# Patient Record
Sex: Female | Born: 1940 | Race: Black or African American | Hispanic: No | Smoking: Never smoker
Health system: Southern US, Community
[De-identification: ages and names within clinical notes are randomized; demographics above are authoritative.]

## PROBLEM LIST (undated history)

## (undated) DIAGNOSIS — C159 Malignant neoplasm of esophagus, unspecified: Secondary | ICD-10-CM

## (undated) DIAGNOSIS — M1710 Unilateral primary osteoarthritis, unspecified knee: Secondary | ICD-10-CM

## (undated) DIAGNOSIS — I10 Essential (primary) hypertension: Secondary | ICD-10-CM

## (undated) DIAGNOSIS — K829 Disease of gallbladder, unspecified: Secondary | ICD-10-CM

## (undated) DIAGNOSIS — D3A010 Benign carcinoid tumor of the duodenum: Secondary | ICD-10-CM

## (undated) DIAGNOSIS — F3289 Other specified depressive episodes: Secondary | ICD-10-CM

## (undated) DIAGNOSIS — F329 Major depressive disorder, single episode, unspecified: Secondary | ICD-10-CM

## (undated) DIAGNOSIS — R51 Headache: Secondary | ICD-10-CM

## (undated) DIAGNOSIS — O009 Unspecified ectopic pregnancy without intrauterine pregnancy: Secondary | ICD-10-CM

## (undated) DIAGNOSIS — M179 Osteoarthritis of knee, unspecified: Secondary | ICD-10-CM

## (undated) DIAGNOSIS — H5440 Blindness, one eye, unspecified eye: Secondary | ICD-10-CM

## (undated) DIAGNOSIS — R55 Syncope and collapse: Secondary | ICD-10-CM

## (undated) DIAGNOSIS — C50919 Malignant neoplasm of unspecified site of unspecified female breast: Secondary | ICD-10-CM

## (undated) DIAGNOSIS — E785 Hyperlipidemia, unspecified: Secondary | ICD-10-CM

## (undated) DIAGNOSIS — C189 Malignant neoplasm of colon, unspecified: Secondary | ICD-10-CM

## (undated) DIAGNOSIS — N200 Calculus of kidney: Secondary | ICD-10-CM

## (undated) DIAGNOSIS — C801 Malignant (primary) neoplasm, unspecified: Secondary | ICD-10-CM

## (undated) DIAGNOSIS — F411 Generalized anxiety disorder: Secondary | ICD-10-CM

## (undated) DIAGNOSIS — K219 Gastro-esophageal reflux disease without esophagitis: Secondary | ICD-10-CM

## (undated) DIAGNOSIS — R197 Diarrhea, unspecified: Secondary | ICD-10-CM

## (undated) DIAGNOSIS — M129 Arthropathy, unspecified: Secondary | ICD-10-CM

## (undated) DIAGNOSIS — Z923 Personal history of irradiation: Secondary | ICD-10-CM

## (undated) HISTORY — DX: Other specified depressive episodes: F32.89

## (undated) HISTORY — DX: Hyperlipidemia, unspecified: E78.5

## (undated) HISTORY — DX: Headache: R51

## (undated) HISTORY — DX: Malignant neoplasm of esophagus, unspecified: C15.9

## (undated) HISTORY — DX: Generalized anxiety disorder: F41.1

## (undated) HISTORY — DX: Malignant (primary) neoplasm, unspecified: C80.1

## (undated) HISTORY — DX: Essential (primary) hypertension: I10

## (undated) HISTORY — DX: Gastro-esophageal reflux disease without esophagitis: K21.9

## (undated) HISTORY — PX: BREAST BIOPSY: SHX20

## (undated) HISTORY — DX: Disease of gallbladder, unspecified: K82.9

## (undated) HISTORY — DX: Blindness, one eye, unspecified eye: H54.40

## (undated) HISTORY — PX: BREAST SURGERY: SHX581

## (undated) HISTORY — DX: Malignant neoplasm of colon, unspecified: C18.9

## (undated) HISTORY — DX: Syncope and collapse: R55

## (undated) HISTORY — DX: Calculus of kidney: N20.0

## (undated) HISTORY — DX: Diarrhea, unspecified: R19.7

## (undated) HISTORY — PX: PARTIAL HYSTERECTOMY: SHX80

## (undated) HISTORY — PX: TOTAL KNEE ARTHROPLASTY: SHX125

## (undated) HISTORY — PX: COLONOSCOPY: SHX174

## (undated) HISTORY — DX: Unilateral primary osteoarthritis, unspecified knee: M17.10

## (undated) HISTORY — DX: Osteoarthritis of knee, unspecified: M17.9

## (undated) HISTORY — DX: Major depressive disorder, single episode, unspecified: F32.9

## (undated) HISTORY — DX: Benign carcinoid tumor of the duodenum: D3A.010

## (undated) HISTORY — PX: COLON RESECTION: SHX5231

## (undated) HISTORY — DX: Unspecified ectopic pregnancy without intrauterine pregnancy: O00.90

## (undated) HISTORY — DX: Arthropathy, unspecified: M12.9

---

## 2004-03-05 ENCOUNTER — Ambulatory Visit: Payer: Self-pay | Admitting: Oncology

## 2004-05-16 ENCOUNTER — Ambulatory Visit: Payer: Self-pay | Admitting: Unknown Physician Specialty

## 2006-01-17 ENCOUNTER — Ambulatory Visit: Payer: Self-pay

## 2006-01-18 ENCOUNTER — Ambulatory Visit: Payer: Self-pay

## 2006-03-22 ENCOUNTER — Ambulatory Visit: Payer: Self-pay | Admitting: Unknown Physician Specialty

## 2006-05-09 ENCOUNTER — Other Ambulatory Visit: Payer: Self-pay

## 2006-05-09 ENCOUNTER — Ambulatory Visit: Payer: Self-pay | Admitting: Surgery

## 2006-05-18 ENCOUNTER — Ambulatory Visit: Payer: Self-pay | Admitting: Surgery

## 2006-06-15 ENCOUNTER — Ambulatory Visit: Payer: Self-pay | Admitting: Surgery

## 2006-06-22 ENCOUNTER — Ambulatory Visit: Payer: Self-pay | Admitting: Surgery

## 2006-07-13 ENCOUNTER — Ambulatory Visit: Payer: Self-pay | Admitting: Oncology

## 2006-08-04 ENCOUNTER — Ambulatory Visit: Payer: Self-pay | Admitting: Oncology

## 2006-08-04 ENCOUNTER — Ambulatory Visit: Payer: Self-pay | Admitting: Radiation Oncology

## 2006-09-04 ENCOUNTER — Ambulatory Visit: Payer: Self-pay | Admitting: Radiation Oncology

## 2006-09-04 ENCOUNTER — Ambulatory Visit: Payer: Self-pay | Admitting: Oncology

## 2006-10-04 ENCOUNTER — Ambulatory Visit: Payer: Self-pay | Admitting: Radiation Oncology

## 2006-10-04 ENCOUNTER — Ambulatory Visit: Payer: Self-pay | Admitting: Oncology

## 2006-11-13 ENCOUNTER — Ambulatory Visit: Payer: Self-pay | Admitting: Radiation Oncology

## 2006-11-28 ENCOUNTER — Ambulatory Visit: Payer: Self-pay | Admitting: Family Medicine

## 2006-12-04 ENCOUNTER — Ambulatory Visit: Payer: Self-pay | Admitting: Radiation Oncology

## 2007-01-18 ENCOUNTER — Inpatient Hospital Stay: Payer: Self-pay | Admitting: Surgery

## 2007-02-04 ENCOUNTER — Ambulatory Visit: Payer: Self-pay | Admitting: Oncology

## 2007-02-14 ENCOUNTER — Ambulatory Visit: Payer: Self-pay | Admitting: Oncology

## 2007-03-06 ENCOUNTER — Ambulatory Visit: Payer: Self-pay | Admitting: Oncology

## 2007-04-03 ENCOUNTER — Ambulatory Visit: Payer: Self-pay | Admitting: Unknown Physician Specialty

## 2007-04-24 ENCOUNTER — Encounter: Payer: Self-pay | Admitting: Gastroenterology

## 2007-04-24 ENCOUNTER — Ambulatory Visit: Payer: Self-pay | Admitting: Unknown Physician Specialty

## 2007-04-26 ENCOUNTER — Ambulatory Visit: Payer: Self-pay | Admitting: Unknown Physician Specialty

## 2007-04-29 ENCOUNTER — Ambulatory Visit: Payer: Self-pay | Admitting: Surgery

## 2007-06-06 DIAGNOSIS — C50919 Malignant neoplasm of unspecified site of unspecified female breast: Secondary | ICD-10-CM

## 2007-06-06 HISTORY — PX: BREAST EXCISIONAL BIOPSY: SUR124

## 2007-06-06 HISTORY — DX: Malignant neoplasm of unspecified site of unspecified female breast: C50.919

## 2007-06-20 ENCOUNTER — Encounter: Payer: Self-pay | Admitting: Gastroenterology

## 2007-06-20 ENCOUNTER — Inpatient Hospital Stay (HOSPITAL_COMMUNITY): Admission: AD | Admit: 2007-06-20 | Discharge: 2007-06-27 | Payer: Self-pay | Admitting: Gastroenterology

## 2007-07-24 ENCOUNTER — Ambulatory Visit: Payer: Self-pay | Admitting: Gastroenterology

## 2007-07-24 LAB — CONVERTED CEMR LAB
ALT: 18 units/L (ref 0–35)
Bilirubin, Direct: 0.1 mg/dL (ref 0.0–0.3)
Calcium: 9.8 mg/dL (ref 8.4–10.5)
Eosinophils Absolute: 0 10*3/uL (ref 0.0–0.6)
Eosinophils Relative: 0.7 % (ref 0.0–5.0)
GFR calc Af Amer: 92 mL/min
GFR calc non Af Amer: 76 mL/min
Glucose, Bld: 111 mg/dL — ABNORMAL HIGH (ref 70–99)
Hemoglobin: 12.5 g/dL (ref 12.0–15.0)
Lymphocytes Relative: 30.1 % (ref 12.0–46.0)
MCV: 92 fL (ref 78.0–100.0)
Monocytes Absolute: 0.7 10*3/uL (ref 0.2–0.7)
Neutro Abs: 3.3 10*3/uL (ref 1.4–7.7)
Platelets: 193 10*3/uL (ref 150–400)
Potassium: 2.9 meq/L — ABNORMAL LOW (ref 3.5–5.1)
Sodium: 142 meq/L (ref 135–145)
TSH: 0.6 microintl units/mL (ref 0.35–5.50)
Total Protein: 7.2 g/dL (ref 6.0–8.3)
WBC: 5.7 10*3/uL (ref 4.5–10.5)

## 2007-07-31 ENCOUNTER — Ambulatory Visit: Payer: Self-pay | Admitting: Internal Medicine

## 2007-08-01 ENCOUNTER — Encounter: Payer: Self-pay | Admitting: Gastroenterology

## 2007-08-01 LAB — CONVERTED CEMR LAB: 5-HIAA, 24 Hr Urine: 11.5 mg/(24.h) — ABNORMAL HIGH (ref <=6.0)

## 2007-08-14 ENCOUNTER — Ambulatory Visit: Payer: Self-pay | Admitting: Oncology

## 2007-09-17 ENCOUNTER — Ambulatory Visit: Payer: Self-pay | Admitting: Gastroenterology

## 2007-09-17 LAB — CONVERTED CEMR LAB
Calcium: 9.9 mg/dL (ref 8.4–10.5)
GFR calc Af Amer: 108 mL/min
GFR calc non Af Amer: 89 mL/min
Potassium: 3.2 meq/L — ABNORMAL LOW (ref 3.5–5.1)
Sodium: 142 meq/L (ref 135–145)

## 2007-09-18 ENCOUNTER — Ambulatory Visit: Payer: Self-pay | Admitting: Oncology

## 2007-09-25 ENCOUNTER — Ambulatory Visit (HOSPITAL_COMMUNITY): Admission: RE | Admit: 2007-09-25 | Discharge: 2007-09-25 | Payer: Self-pay | Admitting: Gastroenterology

## 2007-10-01 ENCOUNTER — Telehealth: Payer: Self-pay | Admitting: Gastroenterology

## 2007-10-04 ENCOUNTER — Ambulatory Visit: Payer: Self-pay | Admitting: Oncology

## 2007-10-11 ENCOUNTER — Telehealth: Payer: Self-pay | Admitting: Gastroenterology

## 2007-11-11 DIAGNOSIS — D3A010 Benign carcinoid tumor of the duodenum: Secondary | ICD-10-CM

## 2007-11-11 DIAGNOSIS — R197 Diarrhea, unspecified: Secondary | ICD-10-CM

## 2007-11-11 DIAGNOSIS — O009 Unspecified ectopic pregnancy without intrauterine pregnancy: Secondary | ICD-10-CM

## 2007-11-11 DIAGNOSIS — M129 Arthropathy, unspecified: Secondary | ICD-10-CM | POA: Insufficient documentation

## 2007-11-11 DIAGNOSIS — R51 Headache: Secondary | ICD-10-CM

## 2007-11-11 DIAGNOSIS — I10 Essential (primary) hypertension: Secondary | ICD-10-CM

## 2007-11-11 DIAGNOSIS — R519 Headache, unspecified: Secondary | ICD-10-CM | POA: Insufficient documentation

## 2007-11-11 DIAGNOSIS — F419 Anxiety disorder, unspecified: Secondary | ICD-10-CM

## 2007-11-11 DIAGNOSIS — F329 Major depressive disorder, single episode, unspecified: Secondary | ICD-10-CM

## 2007-11-11 DIAGNOSIS — F32A Depression, unspecified: Secondary | ICD-10-CM | POA: Insufficient documentation

## 2007-11-12 ENCOUNTER — Ambulatory Visit: Payer: Self-pay | Admitting: Gastroenterology

## 2007-11-12 DIAGNOSIS — R933 Abnormal findings on diagnostic imaging of other parts of digestive tract: Secondary | ICD-10-CM

## 2007-11-13 LAB — CONVERTED CEMR LAB
ALT: 20 units/L (ref 0–35)
Basophils Absolute: 0 10*3/uL (ref 0.0–0.1)
Basophils Relative: 0.5 % (ref 0.0–1.0)
CO2: 28 meq/L (ref 19–32)
Calcium: 10.3 mg/dL (ref 8.4–10.5)
Chloride: 104 meq/L (ref 96–112)
Creatinine, Ser: 0.8 mg/dL (ref 0.4–1.2)
GFR calc Af Amer: 92 mL/min
GFR calc non Af Amer: 76 mL/min
Glucose, Bld: 107 mg/dL — ABNORMAL HIGH (ref 70–99)
Hemoglobin: 13.4 g/dL (ref 12.0–15.0)
Lymphocytes Relative: 40.3 % (ref 12.0–46.0)
Monocytes Relative: 11.2 % (ref 3.0–12.0)
Neutro Abs: 2.3 10*3/uL (ref 1.4–7.7)
Neutrophils Relative %: 46.5 % (ref 43.0–77.0)
RBC: 4.31 M/uL (ref 3.87–5.11)
Sodium: 142 meq/L (ref 135–145)
Total Bilirubin: 0.9 mg/dL (ref 0.3–1.2)
Total Protein: 8 g/dL (ref 6.0–8.3)

## 2007-11-25 ENCOUNTER — Ambulatory Visit: Payer: Self-pay | Admitting: Gastroenterology

## 2007-11-26 ENCOUNTER — Telehealth (INDEPENDENT_AMBULATORY_CARE_PROVIDER_SITE_OTHER): Payer: Self-pay | Admitting: *Deleted

## 2007-11-26 LAB — CONVERTED CEMR LAB
CO2: 30 meq/L (ref 19–32)
Calcium: 9.7 mg/dL (ref 8.4–10.5)
Creatinine, Ser: 1 mg/dL (ref 0.4–1.2)
GFR calc Af Amer: 71 mL/min
Glucose, Bld: 108 mg/dL — ABNORMAL HIGH (ref 70–99)
Sodium: 142 meq/L (ref 135–145)

## 2007-11-28 ENCOUNTER — Encounter: Payer: Self-pay | Admitting: Gastroenterology

## 2007-11-28 ENCOUNTER — Ambulatory Visit (HOSPITAL_COMMUNITY): Admission: RE | Admit: 2007-11-28 | Discharge: 2007-11-28 | Payer: Self-pay | Admitting: Gastroenterology

## 2007-12-02 ENCOUNTER — Ambulatory Visit: Payer: Self-pay | Admitting: Gastroenterology

## 2007-12-10 ENCOUNTER — Ambulatory Visit: Payer: Self-pay | Admitting: Gastroenterology

## 2007-12-11 LAB — CONVERTED CEMR LAB
BUN: 13 mg/dL (ref 6–23)
Calcium: 9.8 mg/dL (ref 8.4–10.5)
Creatinine, Ser: 0.9 mg/dL (ref 0.4–1.2)
GFR calc Af Amer: 81 mL/min
GFR calc non Af Amer: 67 mL/min
Glucose, Bld: 105 mg/dL — ABNORMAL HIGH (ref 70–99)

## 2007-12-16 ENCOUNTER — Encounter: Payer: Self-pay | Admitting: Gastroenterology

## 2007-12-25 ENCOUNTER — Ambulatory Visit: Payer: Self-pay | Admitting: Gastroenterology

## 2008-05-04 ENCOUNTER — Ambulatory Visit: Payer: Self-pay | Admitting: Family Medicine

## 2008-07-22 ENCOUNTER — Ambulatory Visit: Payer: Self-pay | Admitting: Gastroenterology

## 2008-07-24 ENCOUNTER — Encounter: Payer: Self-pay | Admitting: Gastroenterology

## 2008-07-28 ENCOUNTER — Ambulatory Visit: Payer: Self-pay | Admitting: Gastroenterology

## 2008-07-28 ENCOUNTER — Encounter: Payer: Self-pay | Admitting: Gastroenterology

## 2008-07-31 ENCOUNTER — Encounter: Payer: Self-pay | Admitting: Gastroenterology

## 2008-08-03 ENCOUNTER — Telehealth: Payer: Self-pay | Admitting: Gastroenterology

## 2008-10-07 ENCOUNTER — Encounter: Payer: Self-pay | Admitting: Family Medicine

## 2008-11-03 ENCOUNTER — Encounter: Payer: Self-pay | Admitting: Family Medicine

## 2008-11-30 ENCOUNTER — Emergency Department: Payer: Self-pay | Admitting: Internal Medicine

## 2008-12-03 ENCOUNTER — Encounter: Payer: Self-pay | Admitting: Family Medicine

## 2008-12-24 ENCOUNTER — Encounter: Payer: Self-pay | Admitting: Gastroenterology

## 2008-12-24 ENCOUNTER — Telehealth (INDEPENDENT_AMBULATORY_CARE_PROVIDER_SITE_OTHER): Payer: Self-pay | Admitting: *Deleted

## 2008-12-28 ENCOUNTER — Ambulatory Visit: Payer: Self-pay | Admitting: Gastroenterology

## 2008-12-29 ENCOUNTER — Ambulatory Visit: Payer: Self-pay | Admitting: Internal Medicine

## 2009-05-01 ENCOUNTER — Emergency Department: Payer: Self-pay | Admitting: Unknown Physician Specialty

## 2009-05-06 ENCOUNTER — Ambulatory Visit: Payer: Self-pay | Admitting: Family Medicine

## 2009-05-19 ENCOUNTER — Ambulatory Visit: Payer: Self-pay | Admitting: Family Medicine

## 2009-09-21 ENCOUNTER — Observation Stay: Payer: Self-pay | Admitting: Internal Medicine

## 2009-10-01 ENCOUNTER — Telehealth (INDEPENDENT_AMBULATORY_CARE_PROVIDER_SITE_OTHER): Payer: Self-pay | Admitting: *Deleted

## 2009-10-08 ENCOUNTER — Encounter: Payer: Self-pay | Admitting: Cardiovascular Disease

## 2009-10-11 ENCOUNTER — Telehealth (INDEPENDENT_AMBULATORY_CARE_PROVIDER_SITE_OTHER): Payer: Self-pay | Admitting: *Deleted

## 2009-10-19 ENCOUNTER — Telehealth (INDEPENDENT_AMBULATORY_CARE_PROVIDER_SITE_OTHER): Payer: Self-pay | Admitting: *Deleted

## 2009-10-20 ENCOUNTER — Ambulatory Visit: Payer: Self-pay

## 2009-10-20 ENCOUNTER — Encounter (HOSPITAL_COMMUNITY): Admission: RE | Admit: 2009-10-20 | Discharge: 2010-01-04 | Payer: Self-pay | Admitting: Internal Medicine

## 2009-10-20 ENCOUNTER — Ambulatory Visit: Payer: Self-pay | Admitting: Cardiology

## 2009-10-20 ENCOUNTER — Ambulatory Visit: Payer: Self-pay | Admitting: Cardiovascular Disease

## 2009-10-20 DIAGNOSIS — R079 Chest pain, unspecified: Secondary | ICD-10-CM

## 2009-11-03 ENCOUNTER — Ambulatory Visit: Payer: Self-pay | Admitting: Oncology

## 2009-11-29 ENCOUNTER — Ambulatory Visit: Payer: Self-pay | Admitting: Oncology

## 2010-02-22 ENCOUNTER — Encounter: Payer: Self-pay | Admitting: Cardiovascular Disease

## 2010-02-28 ENCOUNTER — Telehealth: Payer: Self-pay | Admitting: Internal Medicine

## 2010-03-01 ENCOUNTER — Ambulatory Visit: Payer: Self-pay | Admitting: Cardiology

## 2010-03-28 ENCOUNTER — Ambulatory Visit: Payer: Self-pay | Admitting: General Practice

## 2010-03-29 ENCOUNTER — Ambulatory Visit: Payer: Self-pay | Admitting: Cardiovascular Disease

## 2010-03-29 DIAGNOSIS — E785 Hyperlipidemia, unspecified: Secondary | ICD-10-CM

## 2010-04-08 ENCOUNTER — Ambulatory Visit: Payer: Self-pay | Admitting: Anesthesiology

## 2010-04-11 ENCOUNTER — Inpatient Hospital Stay: Payer: Self-pay | Admitting: General Practice

## 2010-04-13 ENCOUNTER — Encounter: Payer: Self-pay | Admitting: Internal Medicine

## 2010-04-15 ENCOUNTER — Ambulatory Visit: Payer: Self-pay | Admitting: Internal Medicine

## 2010-05-17 ENCOUNTER — Emergency Department: Payer: Self-pay | Admitting: Emergency Medicine

## 2010-05-20 ENCOUNTER — Ambulatory Visit: Payer: Self-pay | Admitting: Oncology

## 2010-06-01 ENCOUNTER — Telehealth: Payer: Self-pay | Admitting: Cardiovascular Disease

## 2010-06-13 ENCOUNTER — Ambulatory Visit: Payer: Self-pay | Admitting: Oncology

## 2010-06-26 ENCOUNTER — Encounter: Payer: Self-pay | Admitting: Gastroenterology

## 2010-06-29 ENCOUNTER — Encounter: Payer: Self-pay | Admitting: Cardiovascular Disease

## 2010-06-29 ENCOUNTER — Emergency Department: Payer: Self-pay | Admitting: Emergency Medicine

## 2010-07-05 NOTE — Assessment & Plan Note (Signed)
Summary: F3W/AMD   Visit Type:  Follow-up Referring Provider:  [facility] Primary Provider:  Tyrone Sage, MD  CC:  3 week follow up. Pre op for right knee replacement; scheduled for Nov. 7 and 2011 with Dr. Ernest Pine.[name]  History of Present Illness: 70 -year-old woman who presented to [facility] in 5/11 for symptoms of chest pain and dizziness/vertigo and was discharged after her cardiac workup was negative with the diagnosis of possible labyrinthitis. Also had a recent episode of neck and chest pain while at church after looking towards to neighbor.  She had a Lexiscan myoview in 5/11.  This showed breast attenuation artifact in the anterior region though they could not definitively exclude mild ischemia.  she feels well and has no cardiac/chest complaints. She does have significant knee pain which limits her ability to exert herself.  She has some exertional dyspnea with moderate to heavy exertion but has never had exertional chest pain.   She is scheduled for right TKR on 04/11/10.   ECG: NSR with a rate of 70 bpm, no St or T wave changes    Current Medications (verified): 1)  Aspirin 325 Mg Tabs (Aspirin) .... One Tablet Once Daily 2)  Zoloft 100 Mg  Tabs (Sertraline Hcl) .... One By Mouth Once Daily 3)  Omeprazole 20 Mg  Tbec (Omeprazole) .... One By Mouth Two Times A Day 4)  Clonazepam 1 Mg  Tabs (Clonazepam) .... One By Mouth Two Times A Day 5)  Simvastatin 80 Mg Tabs (Simvastatin) .... One Tablet At Bedtime 6)  Triamterene-Hctz 37.5-25 Mg  Caps (Triamterene-Hctz) .... One By Mouth Once Daily 7)  Amlodipine Besylate 10 Mg  Tabs (Amlodipine Besylate) .... One By Mouth Once Daily 8)  Arimidex 1 Mg  Tabs (Anastrozole) .... Take 1 Tablet By Mouth Once A Day 9)  K-Dur 20 Meq .... Take 1 Tablet Two Times A Day 10)  Cvs Anti-Diarrhea 2 Mg Tabs (Loperamide Hcl) .... 2 Tablets By Mouth Three Times A Day As Needed 11)  Imodium A-D 2 Mg Tabs (Loperamide Hcl) .... 2 Tablets By  Mouth Once Daily As Needed 12)  Cholestyramine   Powd (Cholestyramine) .... Take 4gram Scoop, Once Daily 13)  Dexilant 60 Mg Cpdr (Dexlansoprazole) .[name].. 1 As Needed 14)  Sertraline Hcl 100 Mg Tabs (Sertraline Hcl) .... Take Two Tablets Once Daily  Allergies (verified): 1)  ! 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  Alcohol Use - no Regular Exercise - no Drug Use - no  Risk Factors: Alcohol Use: 0 (2009/11/03) Caffeine Use: 1-2 (11/03/2009) Exercise: no (11/03/09)  Risk Factors: Smoking Status: never (11/03/2009)  Review of Systems  The patient denies fever, weight loss, weight gain, vision loss, decreased hearing, hoarseness, chest pain, syncope, dyspnea on exertion, peripheral edema, prolonged cough, abdominal pain, incontinence, muscle weakness, depression, and enlarged lymph nodes.         knee pain  Vital Signs:  Patient profile:   70 year old female Height:      65 inches Weight:  177 pounds Pulse rate:   82 / minute BP sitting:   119 / 77  (left arm) Cuff size:   large  Vitals Entered By: Bishop Dublin, CMA (March 29, 2010 3:40 PM)  Physical Exam  General:  Well developed, well nourished, in no acute distress. Head:  normocephalic and atraumatic Neck:  Neck supple, no JVD. No masses, thyromegaly or abnormal cervical nodes. Lungs:  Clear  bilaterally to auscultation and percussion. Heart:  Non-displaced PMI, chest non-tender; regular rate and rhythm, S1, S2 without murmurs, rubs or gallops. Carotid upstroke normal, no bruit. Pedals normal pulses. No edema, no varicosities. Abdomen:  Bowel sounds positive; abdomen soft and non-tender without masses Msk:  Back normal, normal gait. Muscle strength and tone normal. Pulses:  pulses normal in all 4 extremities Extremities:  No clubbing or cyanosis. Neurologic:  Alert and oriented x 3. Skin:  Intact without lesions or rashes. Psych:  Normal affect.   Impression & Recommendations:  Problem # 1:  CHEST PAIN-UNSPECIFIED (ICD-786.50) No chest pain symptoms. Negative stress test 10/2009 no further workup needed Acceptable risk for upcoming knee replacement surgery with Dr. Ernest Pine. Can stop asa if needed prior to surgery.   Her updated medication list for this problem includes:    Aspirin 81 Mg Tbec (Aspirin) .[name]... Take 2 tablet by mouth daily    Amlodipine Besylate 10 Mg Tabs (Amlodipine besylate) ..... One by mouth once daily  Problem # 2:  HYPERTENSION (ICD-401.9) BP is well controlled on todays visit. No medication changes  Her updated medication list for this problem includes:    Aspirin 81 Mg Tbec (Aspirin) .[name]... Take 2 tablet by mouth daily    Triamterene-hctz 37.5-25 Mg Caps (Triamterene-hctz) ..... One by mouth once daily    Amlodipine Besylate 10 Mg Tabs (Amlodipine besylate) ..... One by mouth once daily  Problem # 3:  HYPERLIPIDEMIA-MIXED (ICD-272.4) Decrease simva to 40 mg daily given new warnings concerning 80 mg. No known CAD. Will check cholesterol in late 06/2010 Her updated medication list for this problem includes:     Simvastatin 40 Mg Tabs (Simvastatin) .[name]... Take one tablet by mouth daily at bedtime  Patient Instructions: 1)  Your physician recommends that you return for a FASTING lipid profile: End of January 2)  Your physician has recommended you  make the following change in your medication: Decrease simvastatin 40mg  daily 3)  Your physician wants you to follow-up in:   1 year You will receive a reminder letter in the mail two months in advance. If you don't receive a letter, please call our office to schedule the follow-up appointment. Prescriptions: SIMVASTATIN 40 MG TABS (SIMVASTATIN) Take one tablet by mouth daily at bedtime  #30 x 6   Entered by:   Benedict Needy, RN   Authorized by:   Dossie Arbour MD   Signed by:   Benedict Needy, RN on 03/29/2010   Method used:   Print then Give to Patient   RxID:   [id number]

## 2010-07-05 NOTE — Miscellaneous (Signed)
  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

## 2010-07-05 NOTE — Progress Notes (Signed)
Summary: SURGICAL CLEARANCE  Phone Note From Other Clinic   Caller: MARIE Caller: MARIE AT [facility] OFFICE Call For: KLEIN Summary of Call: NEEDS SURGICAL CLEARANCE 02/09/10 Initial call taken by: Harlon Flor,  February 28, 2010 10:21 AM  Follow-up for Phone Call        Pt's granddaughter called pt had chest pain at church on sunday.   Additional Follow-up for Phone Call Additional follow up Details #1::        Patient has a follow up appointment today to see Dr. Shirlee Latch for surgical clearance. Additional Follow-up by: Bishop Dublin, CMA,  March 01, 2010 2:53 PM

## 2010-07-05 NOTE — Progress Notes (Signed)
Summary: attempting to schedule MV  Phone Note Outgoing Call   Summary of Call: called pt to set up lexiscan MV as ordered by Dr. Gala Romney; dx: chest pain.  No answer at number listed.  Will cont. to call. Initial call taken by: Charlena Cross, RN, BSN,  October 01, 2009 2:02 PM  Follow-up for Phone Call        Scheduled appointment. Follow-up by: Harlon Flor,  Oct 04, 2009 10:09 AM     Appended Document: attempting to schedule MV ok please continue to try and contact.

## 2010-07-05 NOTE — Progress Notes (Signed)
Summary: Nuclear Pre-Procedure  Phone Note Outgoing Call Call back at [facility] Phone [phone]   Call placed by: Stanton Kidney, EMT-P,  Oct 19, 2009 11:47 AM Call placed to: Patient Action Taken: Phone Call Completed Summary of Call: Reviewed information on Myoview Information Sheet (see scanned document for further details).  Spoke with Patient.    Nuclear Med Background Indications for Stress Test: Evaluation for Ischemia, Post Hospital  Indications Comments: 09/21/09 [facility]: CP/Dizziness    Symptoms: Chest Pain, Dizziness    Nuclear Pre-Procedure Cardiac Risk Factors: Hypertension, Lipids Height (in): 65

## 2010-07-05 NOTE — Assessment & Plan Note (Signed)
Summary: Cardiology Nuclear Study  Nuclear Med Background Indications for Stress Test: Evaluation for Ischemia, Post Hospital  Indications Comments: 09/21/09 [facility]: CP/Dizziness   History Comments: NO DOCUMENTED CAD; ? GXT in past  Symptoms: Chest Pain, Diaphoresis, Dizziness, DOE, Nausea, Near Syncope, Rapid HR    Nuclear Pre-Procedure Cardiac Risk Factors: Family History - CAD, Hypertension, Lipids Caffeine/Decaff Intake: None NPO After: 9:00 PM Lungs: Clear.  O2 Sat 95% on RA. IV 0.9% NS with Angio Cath: 22g     IV Site: (L) AC IV Started by: Stanton Kidney EMT-P Chest Size (in) 42     Cup Size C     Height (in): 67 Weight (lb): 174 BMI: 27.35  Nuclear Med Study 1 or 2 day study:  1 day     Stress Test Type:  Eugenie Birks Reading MD:  Olga Millers, MD     Referring MD:  Julien Nordmann, MD Resting Radionuclide:  Technetium 77m Tetrofosmin     Resting Radionuclide Dose:  11 mCi  Stress Radionuclide:  Technetium 70m Tetrofosmin     Stress Radionuclide Dose:  33 mCi   Stress Protocol   Lexiscan: 0.4 mg   Stress Test Technologist:  Rea [employer] CMA-N     Nuclear Technologist:  Domenic Polite CNMT  Rest Procedure  Myocardial perfusion imaging was performed at rest 45 minutes following the intravenous administration of Myoview Technetium 91m Tetrofosmin.  Stress Procedure  The patient received IV Lexiscan 0.4 mg over 15-seconds.  Myoview injected at 30-seconds.  There were no significant EKG changes with lexiscan.  She did have a hypotensive response to the lexiscan and c/o throat tightness.  Quantitative spect images were obtained after a 45 minute delay.  QPS Raw Data Images:  Acuisition technically good; normal left ventricular size. Stress Images:  There is decreased uptake in the anterior wall. Rest Images:  There is decreased uptake in the anterior wall, slightly less prominent compared to the stress images. Subtraction (SDS):  These findings are consistent with probable  soft tissue attenuation; possible mild anterior ischemia. Transient Ischemic Dilatation:  .96  (Normal <1.22)  Lung/Heart Ratio:  .34  (Normal <0.45)  Quantitative Gated Spect Images QGS EDV:  80 ml QGS ESV:  21 ml QGS EF:  74 % QGS cine images:  Normal wall motion.   Overall Impression  Exercise Capacity: Lexiscan study with no exercise. BP Response: Hypotensive blood pressure response. Clinical Symptoms: Patient complained of throat tightness. ECG Impression: No significant ST segment change suggestive of ischemia. Overall Impression: Abnormal lexiscan nuclear study with soft tissue attenuation; possible very mild anterior ischemia as well.

## 2010-07-05 NOTE — Assessment & Plan Note (Signed)
Summary: CHEST PAIN AND NEEDS SURGICAL CLEARANCE/ALT   Visit Type:  Follow-up Referring Provider:  [facility] Primary Provider:  Tyrone Sage, MD  CC:  c/o a hurting in neck that radiated down to her chest and into back with nausea and vomiting on Sunday, February 27, 2010.  "I am not feeling good".  Is scheduled  for right total knee replacement on November 7, and 2011 with Dr. Ernest Pine.Marland Kitchen  History of Present Illness: 70 -year-old woman who presented to [facility] in 5/11 for symptoms of chest pain and dizziness/vertigo and was discharged after her cardiac workup was negative with the diagnosis of possible labyrinthitis.  She had a Lexiscan myoview in 5/11.  This showed breast attenuation artifact in the anterior region though they could not definitively exclude mild ischemia.  She did well after that until this past Sunday.  She was sitting in the pew at church and turned her head to the left to talk to a neighbor.  She then felt a sharp pain in her right upper back that migrated across her right shoulder and into her central chest.  This was quite severe.  She felt nauseated and vomited once.  The pain lasted 30 minutes and resolved.  She stayed at church and did not go to the ER.  She has had no further chest or back pain.  She has some exertional dyspnea with moderate to heavy exertion but has never had exertional chest pain.   She is scheduled for right TKR on 04/11/10.   ECG: NSR, nonspecific T wave changes    Current Medications (verified): 1)  Aspirin 325 Mg Tabs (Aspirin) .... One Tablet Once Daily 2)  Zoloft 100 Mg  Tabs (Sertraline Hcl) .... One By Mouth Once Daily 3)  Omeprazole 20 Mg  Tbec (Omeprazole) .... One By Mouth Two Times A Day 4)  Clonazepam 1 Mg  Tabs (Clonazepam) .... One By Mouth Two Times A Day 5)  Simvastatin 40 Mg  Tabs (Simvastatin) .... One By Mouth Sometimes 6)  Triamterene-Hctz 37.5-25 Mg  Caps (Triamterene-Hctz) .... One By Mouth Once  Daily 7)  Amlodipine Besylate 10 Mg  Tabs (Amlodipine Besylate) .... One By Mouth Once Daily 8)  Arimidex 1 Mg  Tabs (Anastrozole) .... Take 1 Tablet By Mouth Once A Day 9)  K-Dur 20 Meq .... Take 1 Tablet Two Times A Day 10)  Cvs Anti-Diarrhea 2 Mg Tabs (Loperamide Hcl) .... 2 Tablets By Mouth Three Times A Day As Needed 11)  Imodium A-D 2 Mg Tabs (Loperamide Hcl) .... 2 Tablets By Mouth Once Daily As Needed 12)  Cholestyramine   Powd (Cholestyramine) .... Take 4gram Scoop, Once Daily 13)  Hydrocodone-Acetaminophen 5-500 Mg Tabs (Hydrocodone-Acetaminophen) .... As Needed 14)  Dexilant 60 Mg Cpdr (Dexlansoprazole) .Marland Kitchen.. 1 As Needed 15)  Sertraline Hcl 100 Mg Tabs (Sertraline Hcl) .... Take Two Tablets Once Daily  Allergies (verified): 1)  ! 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  Alcohol Use - no Regular Exercise - no Drug Use -  no  Risk Factors: Alcohol Use: 0 (10/20/2009) Caffeine Use: 1-2 (10/20/2009) Exercise: no (10/20/2009)  Risk Factors: Smoking Status: never (10/20/2009)  Review of Systems       All systems reviewed and negative except as per HPI.   Vital Signs:  Patient profile:   70 year old female Height:      65 inches Weight:      175 pounds BMI:     29.23 Pulse rate:   58 / minute BP sitting:   120 / 81  (left arm) Cuff size:   large  Vitals  Entered By: Bishop Dublin, CMA (March 01, 2010 4:21 PM)  Physical Exam  General:  Well developed, well nourished, in no acute distress. Neck:  Neck supple, no JVD. No masses, thyromegaly or abnormal cervical nodes. Lungs:  Clear bilaterally to auscultation and percussion. Heart:  Non-displaced PMI, chest non-tender; regular rate and rhythm, S1, S2 without murmurs, rubs or gallops. Carotid upstroke normal, no bruit. Pedals normal pulses. No edema, no varicosities. Abdomen:  Bowel sounds positive; abdomen soft and non-tender without masses, organomegaly, or hernias noted. No hepatosplenomegaly. Extremities:  No clubbing or cyanosis. Neurologic:  Alert and oriented x 3. Psych:  Normal affect.   Impression & Recommendations:  Problem # 1:  CHEST PAIN-UNSPECIFIED (ICD-786.50) Very atypical chest pain episode several days ago.  Suspect this was noncardiac.  Lexiscan myoview in 5/11 was suggestive of anterior soft tissue attenuation and was probably normal.  She has had no other episodes.  No exertional symptoms.  I will have her followup in 3 wks with Dr. Mariah Milling.  If she has further worrisome chest pain, would have to consider cath as cannot completely rule out anterior ischemia on myoview (though doubt).  If no worrisome symptoms, then would be ok to proceed with surgery.  Continue ASA and statin.

## 2010-07-05 NOTE — Progress Notes (Signed)
Summary: STRESS TEST  Phone Note Call from Patient Call back at [phone]   Caller: GRANDAUGHTER ([name]) Call For: [facility] Summary of Call: WOULD LIKE A CALL BACK ABOUT WHY HER GRANDMOTHER IS SCHEDULED FOR A STRESS TEST WHEN THEY WERE NEVER TOLD ABOUT IT Initial call taken by: Harlon Flor,  Oct 11, 2009 10:46 AM  Follow-up for Phone Call        Spoke with pts granddaughter, Rodney Booze, and re-scheduled Lexiscan for 5-18 at 9:30 and pt will f/u with Dr. Mariah Milling at 2:15.  (phone note on behalf of Harriett Sine)

## 2010-07-05 NOTE — Assessment & Plan Note (Signed)
Summary: NP6/AMD   Visit Type:  new post hospital Referring Provider:  [facility] Primary Provider:  Tyrone Sage, MD  CC:  had stress test this am and develop some chest tightness and bp dropped with the lexiscan. some chest pains off and on. short of breath with sweeping the floor or mop and gets very weak. pt needs a right knee replacement. some edema in ankles..  History of Present Illness: patient is a very pleasant 70 -year-old woman who presented to [facility] last month for symptoms of chest pain and shortness of breath and was discharged after her workup was negative.  She had a stress test today  Chest Pain, Diaphoresis, Dizziness, DOE, Nausea, Near Syncope, Rapid HR. it was a pharmacologic stress test. She had breast attenuation artifact in the anterior region though they could not definitively exclude mild ischemia.  Overall she states that she has been doing well. Very occasionally she has episodes of fleeting chest discomfort that come on typically at rest. She ambulates slowly at baseline. She does get out and do a lot of walking and typically she has no symptoms with this.  Preventive Screening-Counseling & Management  Alcohol-Tobacco     Alcohol drinks/day: 0     Smoking Status: never  Caffeine-Diet-Exercise     Caffeine use/day: 1-2     Does Patient Exercise: no      Drug Use:  no.    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 One By Mouth Once Daily 2)  Zoloft 100 Mg  Tabs (Sertraline Hcl) .... One By Mouth  Once Daily 3)  Omeprazole 20 Mg  Tbec (Omeprazole) .... One By Mouth Two Times A Day 4)  Clonazepam 1 Mg  Tabs (Clonazepam) .... One By Mouth Two Times A Day 5)  Simvastatin 40 Mg  Tabs (Simvastatin) .... One By Mouth Sometimes 6)  Triamterene-Hctz 37.5-25 Mg  Caps (Triamterene-Hctz) .... One By Mouth Once Daily 7)  Amlodipine Besylate 10 Mg  Tabs (Amlodipine Besylate) .... One By Mouth Once Daily 8)  Arimidex 1 Mg  Tabs (Anastrozole) .... Take 1 Tablet By Mouth Once A Day 9)  K-Dur 20 Meq .... Take 1 Tab Daily 10)  Cvs Anti-Diarrhea 2 Mg Tabs (Loperamide Hcl) .... 2 Tablets By Mouth Three Times A Day As Needed 11)  Imodium A-D 2 Mg Tabs (Loperamide Hcl) .... 2 Tablets By Mouth Once Daily As Needed 12)  Cholestyramine   Powd (Cholestyramine) .... Take 4gram Scoop, Once Daily 13)  Hydrocodone-Acetaminophen 5-500 Mg Tabs (Hydrocodone-Acetaminophen) .... As Needed 14)  Dexilant 60 Mg Cpdr (Dexlansoprazole) .Marland Kitchen.. 1 As Needed  Allergies (verified): 1)  ! Morphine  Family History: Mother: deceased 66: cancer Brother: deceased 21: heart attack Family History of Diabetes:   Social History: Retired  Married  Tobacco Use - No.  Alcohol Use - no Regular Exercise - no Drug Use - no Alcohol drinks/day:  0 Smoking Status:  never Caffeine use/day:  1-2 Does Patient Exercise:  no Drug Use:  no  Review of Systems  The patient complains of chest pain.  The patient denies fever, weight loss, weight gain, vision loss, decreased hearing, hoarseness, syncope, dyspnea on exertion, peripheral edema, prolonged cough, abdominal pain, incontinence, muscle weakness, depression, and enlarged lymph nodes.    Vital Signs:  Patient profile:   70 year old female Height:      65 inches Weight:      175.75 pounds BMI:     29.35 Pulse rate:   78 / minute Pulse rhythm:   regular BP sitting:   100 / 70  (left arm) Cuff size:   large  Vitals Entered By: Mercer Pod (Oct 20, 2009 2:59  PM)  Physical Exam  General:  well-appearing African American woman in no apparent distress, HEENT exam is benign, or Chalmers Guest is clear, neck is supple with no JVP or carotid bruits, heart sounds are regular with S1-S2 and no murmurs appreciated lungs are clear to auscultation with no wheezes Rales, abdominal exam is benign, no significant lower extremity edema, neurologic exam is grossly nonfocal, skin is dry.   Impression & Recommendations:  Problem # 1:  CHEST PAIN-UNSPECIFIED (ICD-786.50) etiology of her chest pain is likely noncardiac. It is somewhat atypical in nature. Her stress test shows likely attenuation artifact in the anterior wall but could not definitively exclude mild ischemia. As her presentation is somewhat atypical, we'll watch her for now with medical management. She is on aspirin and a statin. I have talked with her and after results on her answering machine with her granddaughter who is a Engineer, civil (consulting) working at [employer]. I have Asked them to contact me if they have any further questions or if she develops worsening symptoms.  Her updated medication list for this problem includes:    Adult Aspirin Ec Low Strength 81 Mg Tbec (Aspirin) ..... One by mouth once daily    Amlodipine Besylate 10 Mg Tabs (Amlodipine besylate) ..... One by mouth once daily  Patient Instructions: 1)  Your physician has requested that you regularly monitor and record your blood pressure readings at home.  Please use the same machine at the same time of day to check your readings and record them and call us if BP's are running consistantly low.

## 2010-07-05 NOTE — Letter (Signed)
Summary: PHI  PHI   Imported By: Harlon Flor 10/21/2009 10:51:58  _____________________________________________________________________  External Attachment:    Type:   Image     Comment:   External Document

## 2010-07-06 ENCOUNTER — Ambulatory Visit: Payer: Self-pay | Admitting: Oncology

## 2010-07-07 ENCOUNTER — Encounter: Payer: Self-pay | Admitting: Internal Medicine

## 2010-07-07 NOTE — Progress Notes (Signed)
Summary: elevated BP  Phone Note Call from Patient Call back at [phone] ext [phone]   Caller: Granddaughter Rodney Booze) Call For: Mariah Milling Summary of Call: 146/100.  Has been running this for the past week.  Allena Katz took pt off of BP medication bc she had bottomed out to 79/50 a week ago.   Pt's granddaughter states that it has been exactly 146/100 for the past week with zero fluctuation.  PT will not work with her right now bc of her BP being so elevated.  Pt has been taking all other medications as directed. Initial call taken by: Harlon Flor,  June 01, 2010 12:30 PM  Follow-up for Phone Call        Spoke to pt's grandaughter Rodney Booze), she states Dr. Allena Katz started pt back on Amlodipine but decr dose to 5mg  once daily. Pt was taking 10mg  once daily. Previously pt's BP was 79/50 and Amlodipine was d/c'd. Pt has f/u with Dr. Allena Katz beginning of January. Advised that pt record BP's daily for one week since her BP has fluctuated and since she is now on another dose of Amlodipine so we can see what her trend is. Pt's grandaughter will notify our office with the BP results. Follow-up by: Lanny Hurst RN,  June 02, 2010 9:52 AM    New/Updated Medications: AMLODIPINE BESYLATE 5 MG TABS (AMLODIPINE BESYLATE) Take one tablet by mouth daily

## 2010-07-08 ENCOUNTER — Ambulatory Visit: Payer: Medicare PPO | Admitting: Cardiovascular Disease

## 2010-07-13 ENCOUNTER — Encounter: Payer: Self-pay | Admitting: Cardiovascular Disease

## 2010-07-13 ENCOUNTER — Ambulatory Visit (INDEPENDENT_AMBULATORY_CARE_PROVIDER_SITE_OTHER): Payer: Medicare PPO | Admitting: Cardiovascular Disease

## 2010-07-13 DIAGNOSIS — I959 Hypotension, unspecified: Secondary | ICD-10-CM

## 2010-07-13 DIAGNOSIS — R55 Syncope and collapse: Secondary | ICD-10-CM | POA: Insufficient documentation

## 2010-07-13 DIAGNOSIS — E785 Hyperlipidemia, unspecified: Secondary | ICD-10-CM

## 2010-07-21 NOTE — Assessment & Plan Note (Signed)
Summary: post  [facility]/sab   Visit Type:  Follow-up Referring Provider:  [facility] Primary Provider:  Tyrone Sage, MD  CC:  f/u from [facility]. pt is wearing event monitor. Denies chest pain and SOB.Marland Kitchen  History of Present Illness: 70 -year-old woman who presented to [facility] in 5/11 for symptoms of chest pain and dizziness/vertigo and was discharged after her cardiac workup was negative with the diagnosis of possible labyrinthitis. Also had a recent episode of neck and chest pain while at church after looking towards to neighbor.  She had a Lexiscan myoview in 5/11.  This showed breast attenuation artifact in the anterior region though they could not definitively exclude mild ischemia.  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 She has been participating in physical therapy. She reports having 2 near syncopal episodes while performing aggressive physical therapy while in a standing position. Both episodes, she was hypotensive. First episode she had pressure of 90/50, second episode blood pressure was 79/50. IV fluid was started on a second episode. The second episode was on January 25 and she has not been back to physical therapy since then. Her knees not back to baseline and she continues to walk with a cane.  At baseline her blood pressure is in the 120s systolic over 80s to 90s diastolic   A. event monitor was ordered and she has worn this for one week and telemetry readings showed no significant arrhythmia  ECG: normal sinus rhythm with rate 59 beats per minute with nonspecific T wave abnormality in 3 and aVF    Current Medications (verified): 1)  Aspirin 81 Mg Tbec (Aspirin) .... Take 2 Tablet By Mouth Daily 2)  Zoloft 100 Mg  Tabs (Sertraline Hcl) .... 2 Tablets Once Daily At Bedtime 3)  Omeprazole 20 Mg  Tbec (Omeprazole) .... One Tablet Once Daily 4)  Clonazepam 1 Mg  Tabs (Clonazepam) .... One By Mouth Two Times A Day 5)  Amlodipine Besylate 10 Mg  Tabs (Amlodipine Besylate) .Marland Kitchen.. 1 Tablet Once Daily 6)  Arimidex 1 Mg  Tabs (Anastrozole) .... Take 1 Tablet By Mouth Once A Day 7)  K-Dur 20 Meq .... Take 1 Tablet Two Times A Day 8)  Pravastatin Sodium 40 Mg Tabs (Pravastatin Sodium) .Marland Kitchen.. 1 Tablet Once Daily 9)  Meloxicam 7.5 Mg Tabs (Meloxicam) .Marland Kitchen.. 1 Tablet Two Times A Day  Allergies (verified): 1)  ! 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  Alcohol Use - no Regular Exercise - no Drug Use - no  Risk Factors: Alcohol Use: 0 (Nov 02, 2009) Caffeine Use: 1-2 (11-02-2009) Exercise: no (11-02-09)  Risk Factors: Smoking Status: never (Nov 02, 2009)  Review of Systems       The patient complains of difficulty walking.  The patient denies fever, weight loss, weight gain, vision loss, decreased hearing, hoarseness, chest pain, syncope, dyspnea on exertion, peripheral edema, prolonged cough, abdominal pain, incontinence, muscle weakness, depression, and enlarged lymph nodes.         2 episodes of near syncope  while performing physical therapy in a standing position  Vital Signs:  Patient profile:   70 year old female Height:      65 inches Weight:      173.50 pounds BMI:     28.98 Pulse rate:   64 / minute BP sitting:   141 / 89  (left arm) Cuff size:   large  Vitals Entered By: Lysbeth Galas CMA (July 13, 2010  3:58 PM)  Physical Exam  General:  Well developed, well nourished, in no acute distress. Head:  normocephalic and atraumatic Neck:  Neck supple, no JVD. No masses, thyromegaly or abnormal cervical nodes. Lungs:  Clear bilaterally to auscultation and percussion. Heart:  Non-displaced PMI, chest non-tender; regular rate and rhythm, S1, S2 without murmurs, rubs or gallops. Carotid upstroke normal, no bruit. Pedals normal pulses. No edema, no varicosities. Abdomen:  Bowel sounds positive; abdomen soft and non-tender without masses Msk:  Back normal, normal gait. Muscle strength and tone normal. Pulses:  pulses normal in all 4 extremities Extremities:  No clubbing or cyanosis. Neurologic:  Alert and oriented x 3. Skin:  Intact without lesions or rashes. Psych:  Normal affect.   Impression & Recommendations:  Problem # 1:  SYNCOPE AND COLLAPSE (ICD-780.2) her episodes of near syncope are from hypotension, possible vasovagal, exacerbated by her blood pressure medication.  I suggested to her that she hold her amlodipine the day prior and the day of her physical therapy, to take her amlodipine after her physical therapy. I suggested she drink more fluids in an effort to hydrate herself, wear her TED hose during physical therapy.  She is to continue to use her event monitor though if she is able to participate in physical therapy again without further symptoms, we could possibly stop her event monitor as I suspect this was secondary to a vasovagal, hypotensive episode.  Her updated medication list for this problem includes:    Aspirin 81 Mg Tbec (Aspirin) .Marland Kitchen... Take 2 tablet by mouth daily    Amlodipine Besylate 10 Mg Tabs (Amlodipine besylate) .Marland Kitchen... 1 tablet once daily  Problem # 2:  HYPERLIPIDEMIA-MIXED (ICD-272.4) Continue her cholesterol medication. No further changes.  The following medications were removed from the medication list:    Simvastatin 80 Mg Tabs (Simvastatin) ..... One  tablet at bedtime    Simvastatin 40 Mg Tabs (Simvastatin) .Marland Kitchen... Take one tablet by mouth daily at bedtime Her updated medication list for this problem includes:    Pravastatin Sodium 40 Mg Tabs (Pravastatin sodium) .Marland Kitchen... 1 tablet once daily  Problem # 3:  CHEST PAIN-UNSPECIFIED (ICD-786.50) She has not had any further episodes of chest pain. No further testing ordered.  Her updated medication list for this problem includes:    Aspirin 81 Mg Tbec (Aspirin) .Marland Kitchen... Take 2 tablet by mouth daily    Amlodipine Besylate 10 Mg Tabs (Amlodipine besylate) .Marland Kitchen... 1 tablet once daily

## 2010-07-26 ENCOUNTER — Encounter: Payer: Self-pay | Admitting: Internal Medicine

## 2010-08-02 NOTE — Procedures (Signed)
Summary: [facility]  [facility]   Imported By: Harlon Flor 07/29/2010 15:41:06  _____________________________________________________________________  External Attachment:    Type:   Image     Comment:   External Document

## 2010-08-16 NOTE — Letter (Signed)
Summary: [facility] Recheck   Imported By: Roderic Ovens 08/11/2010 15:47:06  _____________________________________________________________________  External Attachment:    Type:   Image     Comment:   External Document

## 2010-09-01 NOTE — Procedures (Addendum)
Summary: [facility]  [employer]   Imported By: Harlon Flor 08/22/2010 14:46:34  _____________________________________________________________________  External Attachment:    Type:   Image     Comment:   External Document  Appended Document: [facility] Sinus rhythm with occ PACs. no evidence of signifcant brady or tachyarrhythmia.   Appended Document: [facility] pt aware.

## 2010-10-18 NOTE — Assessment & Plan Note (Signed)
[facility]                         GASTROENTEROLOGY OFFICE NOTE   NAME:[name], [name]                     MRN:          [id number]  DATE:09/17/2007                            DOB:          08-22-1940    PRIMARY CARE PHYSICIAN:  Dr. Tyrone Sage.   PRIMARY GASTROENTEROLOGIST:  Dr. Lynnae Prude.   GI PROBLEM LIST:  1. History of duodenal carcinoid.  Initially by Dr. Mechele Collin then      underwent endoscopic ultrasound June 20, 2007.  US showed no      involvement of the muscularis propria and so EMR was proceeded      with.  Unfortunate complication of duodenal perforation, although      pathology showed complete removal of the duodenal small low-grade      neuroendocrine tumor, well circumscribed duodenal carcinoid.  Did      not involve the muscularis propria or deep margin of the specimen.      She underwent emergency surgical oversewing of the iatrogenic      perforation.  2. Chronic diarrhea, started mid 2008, workup by Dr. Mechele Collin,      colonoscopy was negative, upper endoscopy showed the carcinoid      above.  I performed a 24-hour urine collection for 5-HIAA and this      was indeed slightly elevated at 11.5, normal is less than 6.  This      is elevated but is much less than is normally seen for carcinoid      syndrome.  CT scan abdomen and pelvis suggested an 11 mm      hypodensity in the head of the pancreas, otherwise essentially      normal.   INTERVAL HISTORY:  I last saw [name] about 2 months ago.  Urinary 5-  HIAA results are as above.  Since then she has continued to have 2-3,  sometimes 4, loose stools a day.  She has not tried anything, such as  Imodium, to help with her diarrhea.  She was on MiraLax following her  duodenal perforation surgery but stopped the MiraLax when her diarrhea  set back in.  She does not have flushing.   CURRENT MEDICINES:  Aspirin, Zoloft, Arimidex, Klor-Con M20, meloxicam,  omeprazole,  clonazepam, simvastatin, triamterene, amlodipine.   PHYSICAL EXAM:  Weight 183 pounds, which is down 1 pound since her last  visit.  Blood pressure 100/70, pulse 64.  CONSTITUTIONAL:  Generally well-appearing.  ABDOMEN:  Soft, nontender, nondistended, normal bowel sounds.   ASSESSMENT AND PLAN:  A 70 year old woman with diarrhea, endoscopically  resected duodenal carcinoid, 11 millimeter hypodensity in pancreas.   It is not clear if the three diagnoses above are related.  I will  arrange for her to have a PET CT scan to better characterize the  pancreatic hypodensity as well as to check for other possible active  spots on PET.  If she still has the lesions seen in her pancreas then I  will arrange for her to have an endoscopic ultrasound with fine needle  aspirate.  In the meantime, she will take  Imodium 1 pill  once daily and I will get a basic metabolic profile.  She  has had hypokalemia, probably from her diarrhea, for the past couple  months.     Rachael Fee, MD  Electronically Signed    DPJ/MedQ  DD: 09/17/2007  DT: 09/17/2007  Job #: [id number]   cc:   Tyrone Sage, Dr.  Lynnae Prude

## 2010-10-18 NOTE — Discharge Summary (Signed)
NAMESIGNORA, ZUCCO              ACCOUNT NO.:  [id number]   MEDICAL RECORD NO.:  [id number]          PATIENT TYPE:  INP   LOCATION:  1525                         [facility]ACILITY[facility]  [facility]   PHYSICIAN:  Anselm Pancoast. Weatherly, M.D.DATE OF BIRTH:  Nov 19, 1940   DATE OF ADMISSION:  06/20/2007  DATE OF DISCHARGE:  06/27/2007                               DISCHARGE SUMMARY   DISCHARGE DIAGNOSES:  1. 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  2. History of cancer of the breast.  3. Hypertension.   OPERATION:  Closure of perforated duodenum; it is right in the first  portion of the duodenum.   HISTORY:  [name] is a 70 year old female from [place] who had  been referred to Dr. Christella Hartigan because of a small tumor located just distal  to the pylorus that appeared to be solid.  She does have a past history  of breast cancer and has a history of hypertension.  Dr. Christella Hartigan was  removing the lesion endoscopically with the ultrasound assistance when  after removal of the lesion noted that there was a perforation of the  duodenum. This was recognized promptly, and he called and asked me to  see the patient promptly.  Fortunately, we were able to get her to the  OR fairly quickly.  She had been sedated for the endoscopy and writhing  around in pain when we first met her.  Family gave permission for  surgery, and she was taken directly over to the operating room where  through an upper midline incision the area was quickly identified,  closed transversely with interrupted silk sutures after closing the  mucosal.  There was a moderate amount of bile spillage, but this  aspirated and irrigated, and postoperatively she did fine.  The  anesthesiologist requested that she go to the step-down ICU  postoperatively.   The pathology report and that Dr. 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  The NG tube was removed, and then over  the next 2 days, she was advanced from liquids to a regular diet which  she has tolerated without problem.  Her incision has healing nicely, and  she is now ready for discharge in improved postoperative condition.  Her  staples have been removed with the wound Steri-Stripped.  She is  receiving Vicodin for pain.  She will continue on her regular  medications.  She is on Arimidex 1 mg daily for the breast cancer,  Hyzaar 100/25 daily, Prevacid 30 mg b.i.d. and Caduet, a hypertensive  medication that she takes daily.  She did not require any supplemental  medications for blood pressure in the immediate postoperative course,  and we resumed the Hyzaar yesterday, and she will resume all of her  blood pressure medications at this time.   The patient does not work and will be seen in our office in  approximately 2 weeks for a wound check.  Hopefully, she will have no  further problems and I am not sure when the next appointment with Dr.  Christella Hartigan is.           ______________________________  Anselm Pancoast. Zachery Dakins, M.D.     WJW/MEDQ  D:  06/27/2007  T:  06/27/2007  Job:  [id number]   cc:   Rachael Fee, MD  [address]  [place], [place] [address]

## 2010-10-18 NOTE — Assessment & Plan Note (Signed)
[facility]                         GASTROENTEROLOGY OFFICE NOTE   NAME:[name], [name]                     MRN:          [id number]  DATE:07/24/2007                            DOB:          1941/01/29    PRIMARY CARE PHYSICIAN:  Dr. Tyrone Sage.   PRIMARY GASTROENTEROLOGIST:  Dr. Lynnae Prude.   REASON FOR VISIT:  Followup from recent hospitalization after duodenal  perforation.   HISTORY OF PRESENT ILLNESS:  [name] is a very pleasant 70 year old  woman whom I met for a direct referral for endoscopic ultrasound for a  small duodenal carcinoid that was seen by Dr. Lynnae Prude.  She had  been having diarrhea and was eventually worked up by Dr. Mechele Collin with a  colonoscopy and upper endoscopy.  The colonoscopy, I believe, was  normal.  Of note, she has a personal history of colon cancer, and she  was arranged for a followup colonoscopy 3 years from then by Dr.  Mechele Collin.  He did an upper endoscopy and saw a small nodule in her  duodenum that was biopsied and proven to be carcinoid.  He arranged for  me to proceed with endoscopic ultrasound.  I did that procedure June 20, 2007.  By endoscopic ultrasound, she had a 6.5 mm nodule that  involved the mucosa and deep mucosal layers of the duodenal bulb wall.  The muscularis propria was not involved.  So, I proceeded with  endoscopic mucosa resection using a Duette system.  Unfortunately, she  suffered a perforation at the site and from there underwent emergency  laparotomy with oversewing of this duodenal defect.  She recovered from  that very well and has been home for about a month.  Pathology from the  endoscopic mucosa resection showed that it was indeed a small carcinoid  tumor, stated being low-grade neuroendocrine tumor (carcinoid tumor).  It was 5 mm in greatest dimension and did not involve the muscularis  propria or the deep margins of the specimen.   When she was hospitalized, I  arranged for her to visit with me after  hospitalization to check to see how she has done since her surgery, as  well as to consider other testing for the carcinoid that she had.  Of  interest, she had had approximately 6 months of several times a day  diarrhea but since the procedure and her surgery, she has actually been  fairly constipated.  She was indeed taking narcotic pain medicine  postoperatively, but has taken no pain medicines in the past 3 to 4 days  and has not had return of her watery diarrhea.   REVIEW OF SYSTEMS:  Notable for a 15-pound weight loss since the  procedure and the complication.  The rest of her review of systems  essentially normal and available on our nursing intake sheet.   PAST MEDICAL HISTORY:  Hypertension, anxiety, arthritis, depression,  personal history of colon cancer resected in 2004 (her colon  surveillance is being performed by Dr. Kerrin Mo.  Breast surgery in  2008 and 2009.  Depression, headaches.  Leg weakness.  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   CURRENT MEDICINES:  Hyzaar.  Aspirin.  Zoloft.  Valium.  Potassium  chloride.  Caduet.  Arimidex.  Os-Cal.   ALLERGIES:  MORPHINE.   SOCIAL HISTORY:  Married with 1 daughter.  Nonsmoker.  Nondrinker.   FAMILY HISTORY:  Colon polyps in her brothers.  Diabetes runs in her  family.  Prostate cancer in her brothers.   PHYSICAL EXAMINATION:  Height 5 feet 6 inches, 184 pounds, blood  pressure 112/78, pulse 92.  CONSTITUTIONAL:  Generally well-appearing.  NEUROLOGIC:  Alert and oriented x3.  LUNGS:  Clear to auscultation bilaterally.  HEART:  Regular rate and rhythm.  ABDOMEN:  Soft, nontender, nondistended.  Normal bowel sounds.  EXTREMITIES:  No lower extremity edema.   ASSESSMENT AND PLAN:  A 70 year old woman recovering from iatrogenic  duodenal perforation for a 5 mm duodenal bulb carcinoid.   She seems to be recovering well from the surgery.  She is actually  fairly constipated now when  she was having watery diarrhea.  I doubt  that she has any more foci of carcinoid, but I think to be safe, I will  arrange for her to have CT scan of the abdomen and pelvis, and also  check urinary 5-HIAA levels.  If these are all normal, I think she could  simply be followed clinically, especially since the small carcinoid that  was removed from her duodenum did not involve any of the deep layers of  the duodenal bowel wall.     Allison Fee, MD  Electronically Signed    DPJ/MedQ  DD: 07/24/2007  DT: 07/25/2007  Job #: [id number]   cc:   Lynnae Prude  Dr. Tyrone Sage

## 2010-10-18 NOTE — Op Note (Signed)
[name], SUMNERS              ACCOUNT NO.:  [id number]   MEDICAL RECORD NO.:  [id number]          PATIENT TYPE:  INP   LOCATION:  0099                         FACILITY:  [facility]   PHYSICIAN:  Anselm Pancoast. 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 Rob Bunting.   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 Rob Bunting.  Perforation was located really right at the pylorus anterior.   OPERATION:  Closure of perforation of duodenum.   SURGEON:  Anselm Pancoast. Zachery Dakins, M.D.   ASSISTANT:  Lennie Muckle, M.D.   ANESTHESIA:  General anesthesia.   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  Dr. Christella Hartigan realized after completion of  removal of the tumor that there was obviously a perforation.  I was  called and fortunately was between cases and we were able to after  talking with the family, the patient was getting IV Cipro and we were  able to take her directly up to the OR.  Dr. Freida Busman was getting ready to  start a colon resection and we are able to use her room and she  volunteered to help me.   DESCRIPTION OF PROCEDURE:  The patient was taken to the operative suite.  The patient was kind of confused, in pain, thrashing about, but she was  introduced with general anesthesia without problems and endotracheal  tube placed.  The Foley catheter was inserted sterilely and then the  abdomen was prepped with Betadine solution and draped in a sterile  manner.  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  She has a history of hypertension, but she has  no history of angina or myocardial infarction.  After induction of  general anesthesia, she has PAS stockings.  After prepping and draping  in a sterile manner, a midline incision was made.  Sharp dissection to  the undersurface of the peritoneum.  The falciform was in this area and  we divided it between [facility] for ease of exposure.  There were some  adhesions from a loop of small bowel to the undersurface of the  incision.  Went down to the umbilicus, but not below and these were  carefully taken down.  There was one little area of serosal resecting it  from the abdominal wall and I put a little 3-0 silk sutures kind of  approximating the serosa where the vein had been firmly adherent to the  undersurface of the abdomen.  With this then we could put a Recruitment consultant in.  Unfortunately you could see the bile stain and  the  perforation was really right at the pylorus and easy to identify.  It  was about a 1.5 cm incision.  It looked like it would be easiest and  best to close it transversely.  I did identify the mucosa through the  lumen and actually closed it with three sutures of 3-0 Vicryl.  Then I  put a Lembert sutures of 3-0 silk serosa to serosa transversely.  There  were about eight sutures placed.  The area is without tension.  I then  irrigated and aspirated.  I did do an aerobic culture of the bile and  there was nothing as far as food in the drainage since she is n.p.o.  I  could not feel any obvious stones in the gallbladder.  We then  irrigated, aspirated, irrigated probably about 4 liters of saline so  that everything was returning clear.  Then I had the anesthetist inflate  the stomach with about 500 mL of saline.  There was no leakage.  Then we  put air and the air goes on into the duodenum.  I do not think there is  any way I can close the  pylorus with this closure.  We then positioned  the NG tube in the stomach and anchored it to her nose.  I think I got  the tip of the NG down into the second portion of the duodenum on  occasion and there was definitely air going passed the closure into the  duodenum.  The omentum was very little so that I could put much over the  small bowel.  I  reinspected the little area where I put the serosal  sutures.  There were some adhesions to the undersurface of midline that  we dropped down to make sure the small bowel was not kinked at all and  then closed the fascia with a looped #1 PDS two sutures and tied the  ends together.  The wound was thoroughly irrigated and the skin was  closed with staples.  The patient tolerated procedure nicely.  I hope  that she go to the floor and the sponge and needle counts were correct.  I will keep her on antibiotics for about 3 days and hopefully this is  not going to be any problem with postoperative infection, gastric outlet  obstruction, etc.           ______________________________  Anselm Pancoast. Zachery Dakins, M.D.     WJW/MEDQ  D:  06/20/2007  T:  06/20/2007  Job:  [id number]   cc:   Anselm Pancoast. Zachery Dakins, M.D.  [address], [address]  [place]  [place] [address]   Rachael Fee, MD  [address]  [place], [place] [address]

## 2010-10-18 NOTE — H&P (Signed)
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   HISTORY:  [name] is a 70 year old African American female who was  referred directly to Dr. Christella Hartigan per Dr. Lynnae Prude in [place] to  undergo endoscopic ultrasound and endoscopic mucosal resection of a  previously proven duodenal carcinoid.  The patient underwent the  procedure this morning per Dr. Christella Hartigan and, with removal of a 6.5-mm  carcinoid lesion, was found afterward to have a duodenal perforation.  She is admitted at this time for surgical management.   The patient had been experiencing diarrhea over the past 6 months, has  had some associated weight loss and abdominal discomfort.   CURRENT MEDICATIONS:  1. Arimidex 1 mg daily.  2. Caduet 10/20 daily.  3. Prevacid 30 b.i.d.  4. Hyzaar 110/125 daily.   ALLERGIES:  MORPHINE which causes hypotension.   FAMILY HISTORY:  Pertinent for colon cancer and breast cancer in several  relatives as well as hypertension and coronary artery disease.   SOCIAL HISTORY:  The patient is married.  She is accompanied by several  family members today.  She is retired.  No tobacco and no EtOH.   PAST HISTORY:  1. Pertinent for right breast cancer status post lumpectomy and      radiation which was completed in 2008.  2. History of colon CA status post bowel resection 2004 per Dr. Katrinka Blazing      in [place].  3. Hypertension.  4. GERD.  5. 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   REVIEW OF SYSTEMS:  Unable to obtain from the patient due to sedation.   LABORATORY DATA:  Labs are pending.   PHYSICAL EXAMINATION:  GENERAL:  A well-developed, sedated,  uncomfortable, African American female.  VITAL SIGNS:  Temperature is 97, blood pressure 95/60, pulse in the 80s,  sat is 98 on room air.  HEENT: Nontraumatic, normocephalic.  EOMI, PERRLA.  Sclerae anicteric.  NECK:  Supple.  CARDIOVASCULAR:  Regular rate and rhythm with S1 and S2.  No murmur,  rub, or gallop.  PULMONARY:  Clear to A and P.  ABDOMEN:  Soft.  Bowel sounds are quiet.  There is no focal tenderness  immediately post procedure.  RECTAL:  Exam not done at this time.  EXTREMITIES:  Without clubbing, cyanosis, or edema.   IMPRESSION:  29. A 70 year old African American female with duodenal perforation      occurring at endoscopic mucosal resection of known duodenal      carcinoid.  2. Hypertension.  3. History of colon cancer status post resection 2004.  4. Breast cancer status post right lumpectomy 2008 with radiation.   PLAN:  The patient is admitted to the service of Dr. Jesusita Oka  Christella Hartigan.  I have  obtained emergent surgical consultation with Dr. Zachery Dakins.  She will be  kept n.p.o. and given stat IV Cipro and Flagyl.  For further details,  please see the orders.      Mike Gip, PA-C      Rachael Fee, MD  Electronically Signed    AE/MEDQ  D:  06/20/2007  T:  06/20/2007  Job:  [id number]   cc:   Dr. Rolene Arbour  Fax: [phone]   Anselm Pancoast. Zachery Dakins, M.D.  [address]  [place]  [place] [address]   Rachael Fee, MD  [address]  [place], [place] [address]

## 2010-11-09 ENCOUNTER — Ambulatory Visit: Payer: Medicare PPO | Admitting: Cardiovascular Disease

## 2010-11-25 ENCOUNTER — Telehealth: Payer: Self-pay | Admitting: *Deleted

## 2010-11-25 ENCOUNTER — Encounter: Payer: Self-pay | Admitting: Gastroenterology

## 2010-11-25 ENCOUNTER — Ambulatory Visit (INDEPENDENT_AMBULATORY_CARE_PROVIDER_SITE_OTHER): Payer: Medicare PPO | Admitting: Gastroenterology

## 2010-11-25 VITALS — BP 132/64 | HR 60 | Ht 67.0 in | Wt 171.0 lb

## 2010-11-25 DIAGNOSIS — R197 Diarrhea, unspecified: Secondary | ICD-10-CM

## 2010-11-25 NOTE — Progress Notes (Signed)
GI PROBLEM LIST:  1. History of duodenal carcinoid. Initially by Dr. Mechele Collin then  underwent endoscopic ultrasound June 20, 2007. US showed no  involvement of the muscularis propria and so EMR was proceeded  with. Unfortunate complication of duodenal perforation, although  pathology showed complete removal of the duodenal small low-grade  neuroendocrine tumor, well circumscribed duodenal carcinoid. Did  not involve the muscularis propria or deep margin of the specimen.  She underwent emergency surgical oversewing of the iatrogenic  perforation.  2. Chronic diarrhea, started mid 2008, workup by Dr. Mechele Collin,  colonoscopy was negative, upper endoscopy showed the carcinoid  above. I performed a 24-hour urine collection for 5-HIAA and this  was indeed slightly elevated at 11.5, normal is less than 6. This  is elevated but is much less than is normally seen for carcinoid  syndrome. CT scan abdomen and pelvis suggested an 11 mm  hypodensity in the head of the pancreas, otherwise essentially  normal. Upper EUS June 2006 showed small cyst in head of pancreas, this was aspirated and there were no signs of malignancy. Repeat 24 hour urine collection showed normal 5 HIAA levels, July 2009. Colonoscopy February 2010 found hemicolectomy anastomosis. Otherwise normal. Biopsies randomly were all normal  3. Colon Cancer: s/p 2004, did not need chemo or xrt;  last colonoscopy February 2010, next February 2015   HPI: This is a very pleasant 70 year old woman whom I last saw 1 or 2 years ago. She is here with her daughter today. I actually ran into her yesterday at [facility] when I performed an endoscopic ultrasound on her husband.  Last seen 1-2 years ago.  Diarrhea had improved for a long, taking no meds at all.  But started back about 2 monhts ago. This is the worst its ever been. Gets up 2-3 times a night.   She was put on antibiotics about 3 months ago, for 5 days.  Thinks stools were already  pretty loose at that point.  She has had no diarrhea testing.    Past Medical History:   Unspecified ectopic pregnancy without intraute*              Headache                                                     Depressive disorder, not elsewhere classified                Arthropathy, unspecified, site unspecified                   Anxiety state, unspecified                                   Hypertension                                                 Diarrhea  Benign carcinoid tumor of the duodenum                       Hyperlipidemia                                              Past Surgical History:   COLON RESECTION                                              BREAST SURGERY                                               reports that she has never smoked. She has never used smokeless tobacco. She reports that she does not drink alcohol or use illicit drugs.  family history includes Cancer in her mother; Diabetes in her other; and Heart attack in her brother.    Current medicines and allergies were reviewed in [place] Link    Physical Exam: BP 132/64  Pulse 60  Ht 5\' 7"  (1.702 m)  Wt 171 lb (77.565 kg)  BMI 26.78 kg/m2 Constitutional: generally well-appearing Psychiatric: alert and oriented x3 Abdomen: soft, nontender, nondistended, no obvious ascites, no peritoneal signs, normal bowel sounds     Assessment and plan: 70 y.o. female with acute on chronic diarrhea  She can increase her Imodium and we will send blood work, stool testing and urine testing. I will call her when these results are all back.

## 2010-11-25 NOTE — Patient Instructions (Signed)
Increase the immodium to 2 pills, twice daily.  Stop or cut back if you get constipated. You will have labs checked today in the basement lab.  Please head down after you check out with the front desk  (cbc, cmet, esr, tsh, stool for ova and parasites, routine culture, c. Diff by PCR, 24 hour urinary test for 5HIAA and creatinine.) A copy of this information will be made available to Dr. [facility]).

## 2010-11-25 NOTE — Telephone Encounter (Signed)
Spoke to granddaughter she does not know if she can get off work to bring patient so is advised to call back if she can not make it. She states that she was trying to avoid the cx fee since this is not pts fault it is her. I have advised her to call back ASAP if she can not make it. She verbalized understading

## 2010-11-30 ENCOUNTER — Other Ambulatory Visit: Payer: Self-pay | Admitting: *Deleted

## 2010-11-30 ENCOUNTER — Other Ambulatory Visit (INDEPENDENT_AMBULATORY_CARE_PROVIDER_SITE_OTHER): Payer: Medicare PPO

## 2010-11-30 ENCOUNTER — Other Ambulatory Visit: Payer: Medicare PPO

## 2010-11-30 DIAGNOSIS — R197 Diarrhea, unspecified: Secondary | ICD-10-CM

## 2010-11-30 LAB — CBC WITH DIFFERENTIAL/PLATELET
Basophils Relative: 1.1 % (ref 0.0–3.0)
Eosinophils Relative: 2.7 % (ref 0.0–5.0)
HCT: 37.1 % (ref 36.0–46.0)
MCV: 90.4 fl (ref 78.0–100.0)
Monocytes Absolute: 0.6 10*3/uL (ref 0.1–1.0)
Monocytes Relative: 12.3 % — ABNORMAL HIGH (ref 3.0–12.0)
Neutrophils Relative %: 47.6 % (ref 43.0–77.0)
RBC: 4.1 Mil/uL (ref 3.87–5.11)
WBC: 4.9 10*3/uL (ref 4.5–10.5)

## 2010-11-30 LAB — COMPREHENSIVE METABOLIC PANEL
Albumin: 4.1 g/dL (ref 3.5–5.2)
Alkaline Phosphatase: 87 U/L (ref 39–117)
BUN: 11 mg/dL (ref 6–23)
CO2: 29 mEq/L (ref 19–32)
Calcium: 9.4 mg/dL (ref 8.4–10.5)
GFR: 118.15 mL/min (ref >60.00)
Glucose, Bld: 91 mg/dL (ref 70–99)
Potassium: 3.4 mEq/L — ABNORMAL LOW (ref 3.5–5.1)
Total Protein: 6.9 g/dL (ref 6.0–8.3)

## 2010-11-30 LAB — SEDIMENTATION RATE: Sed Rate: 16 mm/hr (ref 0–22)

## 2010-11-30 LAB — TSH: TSH: 0.69 u[IU]/mL (ref 0.35–5.50)

## 2010-12-01 LAB — CLOSTRIDIUM DIFFICILE BY PCR: Toxigenic C. Difficile by PCR: NOT DETECTED

## 2010-12-01 LAB — CREATININE CLEARANCE, URINE, 24 HOUR: Creatinine, 24H Ur: 1250 mg/d (ref 700–1800)

## 2010-12-04 LAB — STOOL CULTURE

## 2010-12-12 ENCOUNTER — Ambulatory Visit: Payer: Self-pay | Admitting: Oncology

## 2010-12-13 ENCOUNTER — Other Ambulatory Visit: Payer: Self-pay | Admitting: Gastroenterology

## 2010-12-13 DIAGNOSIS — R197 Diarrhea, unspecified: Secondary | ICD-10-CM

## 2010-12-13 MED ORDER — CHOLESTYRAMINE 4 GM/DOSE PO POWD: 4.0000 g | Freq: Every day | ORAL | Status: DC

## 2011-01-04 ENCOUNTER — Ambulatory Visit: Payer: Self-pay | Admitting: Oncology

## 2011-02-24 LAB — BASIC METABOLIC PANEL
BUN: 4 — ABNORMAL LOW
BUN: 6
Chloride: 103
Chloride: 105
Creatinine, Ser: 0.69
GFR calc non Af Amer: >60
GFR calc non Af Amer: >60
Glucose, Bld: 135 — ABNORMAL HIGH
Glucose, Bld: 141 — ABNORMAL HIGH
Potassium: 3.4 — ABNORMAL LOW
Potassium: 3.7
Sodium: 141

## 2011-02-24 LAB — CBC
HCT: 29.2 — ABNORMAL LOW
HCT: 30.9 — ABNORMAL LOW
HCT: 32.1 — ABNORMAL LOW
HCT: 34.5 — ABNORMAL LOW
Hemoglobin: 11.9 — ABNORMAL LOW
MCHC: 33.7
MCV: 90.8
MCV: 91
MCV: 91.3
Platelets: 180
Platelets: 182
Platelets: 192
Platelets: 196
Platelets: 209
RDW: 14.4
RDW: 14.7
RDW: 14.8
RDW: 15
RDW: 15.2
WBC: 10.5
WBC: 13.4 — ABNORMAL HIGH

## 2011-02-24 LAB — I-STAT 8, (EC8 V) (CONVERTED LAB)
BUN: 8
Chloride: 107
Glucose, Bld: 166 — ABNORMAL HIGH
HCT: 39
Hemoglobin: 13.3
Operator id: 100511
Potassium: 3.1 — ABNORMAL LOW
Sodium: 137

## 2011-02-24 LAB — URINALYSIS, ROUTINE W REFLEX MICROSCOPIC
Nitrite: NEGATIVE
Specific Gravity, Urine: 1.023
Urobilinogen, UA: 0.2
pH: 7

## 2011-02-24 LAB — URINE MICROSCOPIC-ADD ON

## 2011-02-24 LAB — BODY FLUID CULTURE: Gram Stain: NONE SEEN

## 2011-03-25 ENCOUNTER — Observation Stay: Payer: Self-pay | Admitting: Specialist

## 2011-03-26 DIAGNOSIS — R079 Chest pain, unspecified: Secondary | ICD-10-CM

## 2011-05-22 ENCOUNTER — Ambulatory Visit: Payer: Self-pay | Admitting: Oncology

## 2011-06-15 ENCOUNTER — Ambulatory Visit: Payer: Self-pay | Admitting: Oncology

## 2011-06-15 LAB — COMPREHENSIVE METABOLIC PANEL
Alkaline Phosphatase: 125 U/L (ref 50–136)
Anion Gap: 9 (ref 7–16)
Bilirubin,Total: 0.4 mg/dL (ref 0.2–1.0)
Calcium, Total: 9.2 mg/dL (ref 8.5–10.1)
Chloride: 105 mmol/L (ref 98–107)
Co2: 30 mmol/L (ref 21–32)
EGFR (African American): >60
Osmolality: 289 (ref 275–301)
SGPT (ALT): 18 U/L
Sodium: 144 mmol/L (ref 136–145)

## 2011-06-15 LAB — CBC CANCER CENTER
Basophil %: 0.8 %
Eosinophil %: 2.3 %
HGB: 12.8 g/dL (ref 12.0–16.0)
Lymphocyte %: 34.6 %
MCH: 30.6 pg (ref 26.0–34.0)
Monocyte #: 0.5 x10 3/mm (ref 0.0–0.7)
Monocyte %: 10 %
Neutrophil %: 52.3 %
RBC: 4.19 10*6/uL (ref 3.80–5.20)

## 2011-07-07 ENCOUNTER — Ambulatory Visit: Payer: Self-pay | Admitting: Oncology

## 2012-02-08 ENCOUNTER — Encounter: Payer: Self-pay | Admitting: Cardiovascular Disease

## 2012-02-09 ENCOUNTER — Ambulatory Visit (INDEPENDENT_AMBULATORY_CARE_PROVIDER_SITE_OTHER): Payer: Medicare PPO | Admitting: Cardiovascular Disease

## 2012-02-09 ENCOUNTER — Encounter: Payer: Self-pay | Admitting: Cardiovascular Disease

## 2012-02-09 VITALS — BP 130/90 | HR 61 | Ht 67.0 in | Wt 171.5 lb

## 2012-02-09 DIAGNOSIS — I10 Essential (primary) hypertension: Secondary | ICD-10-CM

## 2012-02-09 DIAGNOSIS — R55 Syncope and collapse: Secondary | ICD-10-CM

## 2012-02-09 DIAGNOSIS — E785 Hyperlipidemia, unspecified: Secondary | ICD-10-CM

## 2012-02-09 MED ORDER — LISINOPRIL 20 MG PO TABS: 20.0000 mg | ORAL_TABLET | Freq: Every day | ORAL | Status: DC

## 2012-02-09 NOTE — Assessment & Plan Note (Signed)
Recent near-syncope, previous syncope. We will hold amlodipine, start lisinopril. We will order a two-day Holter.

## 2012-02-09 NOTE — Progress Notes (Signed)
Patient ID: [name], female    DOB: Sep 06, 1940, 71 y.o.   MRN: [id number]  HPI Comments: 31 -year-old woman with a history of syncope, who presented to [facility] in 5/11 for symptoms of chest pain and dizziness/vertigo and was discharged after her cardiac workup was negative with the diagnosis of possible labyrinthitis.  episode of neck and chest pain while at church after looking towards to neighbor, syncope while performing physical therapy after only replacement in 2011, who presents for routine followup.  She reports that she was recently on her way to the eye doctor, traveling in the car, granddaughter was driving. Granddaughter noticed that [name] was slumped over, mumbling, diaphoretic. After a very short period, she awoke but did not feel back to normal for least 30 minutes. She did take an anxiety pill that afternoon at 2 PM (Klonopin). She's not had any further episodes since that time. Her granddaughter is concerned about her low heart rate.    She had a Lexiscan myoview in 5/11.  This showed breast attenuation artifact in the anterior region though they could not definitively exclude mild ischemia.   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 She has been participating in physical therapy. She reports having 2 near syncopal episodes while performing aggressive physical therapy while in a standing position. Both episodes, she was hypotensive. First episode she had pressure of 90/50, second episode blood pressure was 79/50. IV fluid was started on a second episode. The second episode was on January 25 and she has not been back to physical therapy since then. Her knees not back to baseline and she continues to walk with a cane.   Previous event monitor in early 2012 did not show any significant arrhythmia ECG: normal sinus rhythm with rate 61 beats per minute with nonspecific T wave abnormality in V1 through V3      Outpatient  Encounter Prescriptions as of 02/09/2012  Medication Sig Dispense Refill  . acetaminophen (TYLENOL) 500 MG tablet Take 500 mg by mouth. As needed       . aspirin 81 MG EC tablet Take 81 mg by mouth daily.       . bimatoprost (LUMIGAN) 0.01 % SOLN 1 drop at bedtime.      . Brimonidine Tartrate-Timolol (COMBIGAN OP) Apply to eye.      . brinzolamide (AZOPT) 1 % ophthalmic suspension Place 1 drop into both eyes 2 (two) times daily.      . Calcium Carbonate-Vit D-Min (CALCIUM 600+D PLUS MINERALS) 600-400 MG-UNIT TABS Take by mouth 2 (two) times daily.      . clonazePAM (KLONOPIN) 1 MG tablet Takes 1 to 1 1/2 tablets daily as needed.      . Loperamide HCl (ANTI-DIARRHEAL PO) Take by mouth. As needed       . omeprazole (PRILOSEC) 40 MG capsule Take 40 mg by mouth daily.        . potassium chloride (K-DUR,KLOR-CON) 10 MEQ tablet Take 10 mEq by mouth daily.      . pravastatin (PRAVACHOL) 40 MG tablet Take 40 mg by mouth daily.        . sertraline (ZOLOFT) 100 MG tablet Take 100 mg by mouth daily.       Marland Kitchen  amLODipine (NORVASC) 10 MG tablet Take 10 mg by mouth daily.           Review of Systems  Constitutional: Negative.   HENT: Negative.   Eyes:  Negative.   Respiratory: Negative.   Cardiovascular: Negative.   Gastrointestinal: Negative.   Musculoskeletal: Negative.   Skin: Negative.   Neurological: Negative.        Near-syncope  Hematological: Negative.   Psychiatric/Behavioral: Negative.   All other systems reviewed and are negative.    BP 130/90  Pulse 61  Ht 5\' 7"  (1.702 m)  Wt 171 lb 8 oz (77.792 kg)  BMI 26.86 kg/m2  Physical Exam  Nursing note and vitals reviewed. Constitutional: She is oriented to person, place, and time. She appears well-developed and well-nourished.  HENT:  Head: Normocephalic.  Nose: Nose normal.  Mouth/Throat: Oropharynx is clear and moist.  Eyes: Conjunctivae are normal. Pupils are equal, round, and reactive to light.  Neck: Normal range of motion.  Neck supple. No JVD present.  Cardiovascular: Normal rate, regular rhythm, S1 normal, S2 normal, normal heart sounds and intact distal pulses.  Exam reveals no gallop and no friction rub.   No murmur heard. Pulmonary/Chest: Effort normal and breath sounds normal. No respiratory distress. She has no wheezes. She has no rales. She exhibits no tenderness.  Abdominal: Soft. Bowel sounds are normal. She exhibits no distension. There is no tenderness.  Musculoskeletal: Normal range of motion. She exhibits no edema and no tenderness.  Lymphadenopathy:    She has no cervical adenopathy.  Neurological: She is alert and oriented to person, place, and time. Coordination normal.  Skin: Skin is warm and dry. No rash noted. No erythema.  Psychiatric: She has a normal mood and affect. Her behavior is normal. Judgment and thought content normal.         Assessment and Plan

## 2012-02-09 NOTE — Assessment & Plan Note (Signed)
Concerned about recent near syncopal episode as well as previous episodes of syncope. Prior event monitor was normal (30 day). We will hold the amlodipine and start lisinopril 20 mg daily in case she is having vasovagal episodes. Amlodipine could exacerbate these.

## 2012-02-09 NOTE — Assessment & Plan Note (Signed)
We have encouraged her to stay on her statin 

## 2012-02-09 NOTE — Patient Instructions (Addendum)
Hold the amlodipine for now Start lisinopril 20 mg once a day  Please monitor your blood pressure Goal blood pressure 110 to 140 on the top   We will schedule you for a two day monitor for your near pass out spell  Please call us if you have new issues that need to be addressed before your next appt.  Your physician wants you to follow-up in: 3 months.  You will receive a reminder letter in the mail two months in advance. If you don't receive a letter, please call our office to schedule the follow-up appointment.

## 2012-02-22 ENCOUNTER — Telehealth: Payer: Self-pay

## 2012-02-22 NOTE — Telephone Encounter (Signed)
dtr called back. Understanding verb Will order e cardio monitor Pt will be expecting this

## 2012-02-22 NOTE — Telephone Encounter (Signed)
LMTCB Attempted to reach dtr re: Holter results Dr. Mariah Milling says HM shows possible atrial fib and wants pt to wear 30 day EM.

## 2012-02-23 NOTE — Telephone Encounter (Signed)
ecardio monitor ordered 

## 2012-03-12 ENCOUNTER — Telehealth: Payer: Self-pay | Admitting: Cardiovascular Disease

## 2012-03-12 NOTE — Telephone Encounter (Signed)
Pt daughter calling states that grandmother never received the monitor.

## 2012-03-13 NOTE — Telephone Encounter (Signed)
I spoke with Mitch at [employer], who says they have been trying to reach pt via home phone number without response.  He asks for an alternate #. i gave her dtr's # listed in epic.  He says once they can verify address with pt/dtr the monitor  Will get to her tomm.  I called pt to inform. Understanding verb.

## 2012-04-22 ENCOUNTER — Telehealth: Payer: Self-pay

## 2012-04-22 NOTE — Telephone Encounter (Signed)
"  Ok to call pt. Only saw PAC's on monitor" VO Dr. [name], RN Pt informed. Understanding verb

## 2012-04-22 NOTE — Telephone Encounter (Signed)
I called pt to schedule 3 month f/u for December She says to wait until her g'dtr can call us since that will be her transportation She will call us back to make appt

## 2012-05-07 ENCOUNTER — Encounter: Payer: Self-pay | Admitting: Cardiovascular Disease

## 2012-05-07 ENCOUNTER — Other Ambulatory Visit: Payer: Self-pay | Admitting: Cardiovascular Disease

## 2012-05-07 DIAGNOSIS — I4891 Unspecified atrial fibrillation: Secondary | ICD-10-CM

## 2012-05-08 LAB — COMPREHENSIVE METABOLIC PANEL
Albumin: 3.6 g/dL (ref 3.4–5.0)
Anion Gap: 9 (ref 7–16)
BUN: 16 mg/dL (ref 7–18)
Bilirubin,Total: 0.4 mg/dL (ref 0.2–1.0)
Calcium, Total: 8.6 mg/dL (ref 8.5–10.1)
Chloride: 108 mmol/L — ABNORMAL HIGH (ref 98–107)
Creatinine: 0.94 mg/dL (ref 0.60–1.30)
EGFR (African American): >60
EGFR (Non-African Amer.): >60
Glucose: 97 mg/dL (ref 65–99)
Osmolality: 286 (ref 275–301)
Potassium: 2.9 mmol/L — ABNORMAL LOW (ref 3.5–5.1)
SGOT(AST): 23 U/L (ref 15–37)
Sodium: 143 mmol/L (ref 136–145)
Total Protein: 7 g/dL (ref 6.4–8.2)

## 2012-05-08 LAB — URINALYSIS, COMPLETE
Bacteria: NONE SEEN
Glucose,UR: NEGATIVE mg/dL (ref 0–75)
Ketone: NEGATIVE
Nitrite: NEGATIVE
Ph: 6 (ref 4.5–8.0)
Protein: NEGATIVE
Specific Gravity: 1.021 (ref 1.003–1.030)
WBC UR: 3 /HPF (ref 0–5)

## 2012-05-08 LAB — CK TOTAL AND CKMB (NOT AT ARMC)
CK, Total: 153 U/L (ref 21–215)
CK-MB: 1.3 ng/mL (ref 0.5–3.6)

## 2012-05-08 LAB — CBC
HCT: 37.4 % (ref 35.0–47.0)
MCH: 28.6 pg (ref 26.0–34.0)
MCV: 89 fL (ref 80–100)
Platelet: 177 10*3/uL (ref 150–440)
RBC: 4.21 10*6/uL (ref 3.80–5.20)
RDW: 14.5 % (ref 11.5–14.5)
WBC: 5.2 10*3/uL (ref 3.6–11.0)

## 2012-05-08 LAB — TROPONIN I: Troponin-I: <0.02 ng/mL

## 2012-05-09 ENCOUNTER — Observation Stay: Payer: Self-pay | Admitting: Internal Medicine

## 2012-05-09 DIAGNOSIS — I498 Other specified cardiac arrhythmias: Secondary | ICD-10-CM

## 2012-05-10 DIAGNOSIS — I517 Cardiomegaly: Secondary | ICD-10-CM

## 2012-05-10 LAB — POTASSIUM: Potassium: 3.9 mmol/L (ref 3.5–5.1)

## 2012-05-15 ENCOUNTER — Ambulatory Visit: Payer: Medicare PPO | Admitting: Cardiovascular Disease

## 2012-05-15 ENCOUNTER — Ambulatory Visit: Payer: Medicare PPO | Admitting: Gastroenterology

## 2012-05-17 ENCOUNTER — Ambulatory Visit: Payer: Medicare PPO | Admitting: Gastroenterology

## 2012-05-17 ENCOUNTER — Telehealth: Payer: Self-pay | Admitting: Gastroenterology

## 2012-05-17 NOTE — Telephone Encounter (Signed)
Do not charge  

## 2012-05-21 ENCOUNTER — Ambulatory Visit: Payer: Self-pay | Admitting: Surgery

## 2012-05-22 ENCOUNTER — Encounter: Payer: Self-pay | Admitting: Cardiovascular Disease

## 2012-05-22 ENCOUNTER — Ambulatory Visit (INDEPENDENT_AMBULATORY_CARE_PROVIDER_SITE_OTHER): Payer: Medicare PPO | Admitting: Cardiovascular Disease

## 2012-05-22 VITALS — BP 120/86 | HR 58 | Ht 67.0 in | Wt 169.0 lb

## 2012-05-22 DIAGNOSIS — I10 Essential (primary) hypertension: Secondary | ICD-10-CM

## 2012-05-22 DIAGNOSIS — Z01810 Encounter for preprocedural cardiovascular examination: Secondary | ICD-10-CM | POA: Insufficient documentation

## 2012-05-22 DIAGNOSIS — R55 Syncope and collapse: Secondary | ICD-10-CM

## 2012-05-22 NOTE — Patient Instructions (Addendum)
You are doing well. No medication changes were made.  Please call us if you have new issues that need to be addressed before your next appt.  Your physician wants you to follow-up in: 6 months.  You will receive a reminder letter in the mail two months in advance. If you don't receive a letter, please call our office to schedule the follow-up appointment.   

## 2012-05-22 NOTE — Progress Notes (Signed)
Patient ID: [name], female    DOB: 1940/12/27, 71 y.o.   MRN: [id number]  HPI Comments: 16 -year-old woman with a history of syncope, who presented to [facility] in 5/11 for symptoms of chest pain and dizziness/vertigo and was discharged after her cardiac workup was negative with the diagnosis of possible labyrinthitis.  episode of neck and chest pain while at church after looking towards to neighbor, syncope while performing physical therapy after only replacement in 2011, who presents for routine followup.  Previous episode where she was traveling to the eye doctor,  in the car, granddaughter was driving. Granddaughter noticed that [name] was slumped over, mumbling, diaphoretic. After a very short period, she awoke but did not feel back to normal for least 30 minutes. She did take an anxiety pill that afternoon at 2 PM (Klonopin). She's not had any further episodes since that time. There was a question of low heart rate.  Event monitor was performed that showed APCs with normal rhythm. Previous event monitor in early 2012 did not show any significant arrhythmia  Recent admission to the hospital for abdominal pain found to have gallbladder disease. She scheduled for gallbladder surgery in several days' time.    She had a Lexiscan myoview in 5/11.  This showed breast attenuation artifact in the anterior region though they could not definitively exclude mild ischemia.    total knee replacement on the right in November 2011. She has been participating in physical therapy. 2 near syncopal episodes while performing aggressive physical therapy while in a standing position. Both episodes, she was hypotensive. First episode she had pressure of 90/50, second episode blood pressure was 79/50. IV fluid was started on a second episode. The second episode was on June 30 2011   ECG: normal sinus rhythm with rate 58 beats per minute with nonspecific T wave abnormality in V1  through V3      Outpatient Encounter Prescriptions as of 05/22/2012  Medication Sig Dispense Refill  . acetaminophen (TYLENOL) 650 MG CR tablet Take 650 mg by mouth as needed.      Marland Kitchen acetaminophen-codeine (TYLENOL #3) 300-30 MG per tablet Take 1 tablet by mouth as needed.      Marland Kitchen amLODipine (NORVASC) 10 MG tablet Take 10 mg by mouth daily.      Marland Kitchen aspirin 81 MG EC tablet Take 81 mg by mouth daily.       . bimatoprost (LUMIGAN) 0.01 % SOLN 1 drop at bedtime.      . Brimonidine Tartrate-Timolol (COMBIGAN OP) Apply to eye.      . brinzolamide (AZOPT) 1 % ophthalmic suspension Place 1 drop into both eyes 2 (two) times daily.      . Calcium Carbonate-Vit D-Min (CALCIUM 600+D PLUS MINERALS) 600-400 MG-UNIT TABS Take by mouth 2 (two) times daily.      . clonazePAM (KLONOPIN) 1 MG tablet Takes 1 to 1 1/2 tablets daily as needed.      . Loperamide HCl (ANTI-DIARRHEAL PO) Take by mouth. As needed       . ondansetron (ZOFRAN) 4 MG tablet Take 4 mg by mouth every 6 (six) hours as needed.      . potassium chloride (K-DUR,KLOR-CON) 10 MEQ tablet Take 10 mEq by mouth daily.      . sertraline (ZOLOFT) 100 MG tablet Take 100 mg by mouth daily.        Review of Systems  Constitutional: Negative.   HENT: Negative.   Eyes:  Negative.   Respiratory: Negative.   Cardiovascular: Negative.   Gastrointestinal: Positive for abdominal pain.  Musculoskeletal: Negative.   Skin: Negative.   Neurological: Negative.   Hematological: Negative.   Psychiatric/Behavioral: Negative.   All other systems reviewed and are negative.    BP 120/86  Pulse 58  Ht 5\' 7"  (1.702 m)  Wt 169 lb (76.658 kg)  BMI 26.47 kg/m2  Physical Exam  Nursing note and vitals reviewed. Constitutional: She is oriented to person, place, and time. She appears well-developed and well-nourished.  HENT:  Head: Normocephalic.  Nose: Nose normal.  Mouth/Throat: Oropharynx is clear and moist.  Eyes: Conjunctivae normal are normal. Pupils are  equal, round, and reactive to light.  Neck: Normal range of motion. Neck supple. No JVD present.  Cardiovascular: Normal rate, regular rhythm, S1 normal, S2 normal, normal heart sounds and intact distal pulses.  Exam reveals no gallop and no friction rub.   No murmur heard. Pulmonary/Chest: Effort normal and breath sounds normal. No respiratory distress. She has no wheezes. She has no rales. She exhibits no tenderness.  Abdominal: Soft. Bowel sounds are normal. She exhibits no distension. There is no tenderness.  Musculoskeletal: Normal range of motion. She exhibits no edema and no tenderness.  Lymphadenopathy:    She has no cervical adenopathy.  Neurological: She is alert and oriented to person, place, and time. Coordination normal.  Skin: Skin is warm and dry. No rash noted. No erythema.  Psychiatric: She has a normal mood and affect. Her behavior is normal. Judgment and thought content normal.         Assessment and Plan

## 2012-05-22 NOTE — Assessment & Plan Note (Signed)
No arrhythmia seen on event monitor. Prior episodes of near syncope and syncope have been secondary to low blood pressure, possibly medication side effect. We'll continue to closely monitor for symptoms. No recent events.

## 2012-05-22 NOTE — Assessment & Plan Note (Signed)
Blood pressure is well controlled on today's visit. No changes made to the medications. 

## 2012-05-22 NOTE — Assessment & Plan Note (Signed)
She would be acceptable risk for upcoming gallbladder surgery. No further testing needed. We have suggested she hold her antiplatelets for 5 days.

## 2012-05-27 ENCOUNTER — Ambulatory Visit: Payer: Self-pay | Admitting: Surgery

## 2012-05-27 HISTORY — PX: CHOLECYSTECTOMY: SHX55

## 2012-05-27 LAB — CBC WITH DIFFERENTIAL/PLATELET
Eosinophil %: 0.1 %
Lymphocyte #: 0.8 10*3/uL — ABNORMAL LOW (ref 1.0–3.6)
MCHC: 33.1 g/dL (ref 32.0–36.0)
MCV: 91 fL (ref 80–100)
Monocyte #: 0.5 x10 3/mm (ref 0.2–0.9)
Neutrophil #: 7.8 10*3/uL — ABNORMAL HIGH (ref 1.4–6.5)
Neutrophil %: 84.6 %
Platelet: 184 10*3/uL (ref 150–440)
RDW: 15.4 % — ABNORMAL HIGH (ref 11.5–14.5)
WBC: 9.2 10*3/uL (ref 3.6–11.0)

## 2012-05-27 LAB — BASIC METABOLIC PANEL
BUN: 9 mg/dL (ref 7–18)
Calcium, Total: 8.8 mg/dL (ref 8.5–10.1)
Chloride: 106 mmol/L (ref 98–107)
Co2: 26 mmol/L (ref 21–32)
EGFR (Non-African Amer.): >60
Glucose: 119 mg/dL — ABNORMAL HIGH (ref 65–99)
Osmolality: 279 (ref 275–301)
Potassium: 3.3 mmol/L — ABNORMAL LOW (ref 3.5–5.1)
Sodium: 140 mmol/L (ref 136–145)

## 2012-05-28 LAB — BASIC METABOLIC PANEL
Calcium, Total: 9.2 mg/dL (ref 8.5–10.1)
EGFR (Non-African Amer.): >60
Glucose: 144 mg/dL — ABNORMAL HIGH (ref 65–99)
Osmolality: 279 (ref 275–301)
Potassium: 4.2 mmol/L (ref 3.5–5.1)
Sodium: 139 mmol/L (ref 136–145)

## 2012-05-28 LAB — CBC WITH DIFFERENTIAL/PLATELET
Eosinophil %: 0 %
HCT: 38 % (ref 35.0–47.0)
Lymphocyte #: 1.1 10*3/uL (ref 1.0–3.6)
Monocyte %: 4 %
Neutrophil #: 12.3 10*3/uL — ABNORMAL HIGH (ref 1.4–6.5)
Neutrophil %: 88.2 %
RBC: 4.2 10*6/uL (ref 3.80–5.20)
RDW: 15.4 % — ABNORMAL HIGH (ref 11.5–14.5)
WBC: 13.9 10*3/uL — ABNORMAL HIGH (ref 3.6–11.0)

## 2012-07-31 ENCOUNTER — Ambulatory Visit: Payer: Self-pay | Admitting: Family Medicine

## 2012-08-27 ENCOUNTER — Other Ambulatory Visit (INDEPENDENT_AMBULATORY_CARE_PROVIDER_SITE_OTHER): Payer: Medicare PPO

## 2012-08-27 ENCOUNTER — Encounter: Payer: Self-pay | Admitting: Gastroenterology

## 2012-08-27 ENCOUNTER — Ambulatory Visit (INDEPENDENT_AMBULATORY_CARE_PROVIDER_SITE_OTHER): Payer: Medicare PPO | Admitting: Gastroenterology

## 2012-08-27 VITALS — BP 120/86 | HR 60 | Ht 67.0 in | Wt 174.0 lb

## 2012-08-27 DIAGNOSIS — R197 Diarrhea, unspecified: Secondary | ICD-10-CM

## 2012-08-27 DIAGNOSIS — R252 Cramp and spasm: Secondary | ICD-10-CM

## 2012-08-27 LAB — BASIC METABOLIC PANEL
Chloride: 105 mEq/L (ref 96–112)
Creatinine, Ser: 0.7 mg/dL (ref 0.4–1.2)
Potassium: 3.4 mEq/L — ABNORMAL LOW (ref 3.5–5.1)
Sodium: 141 mEq/L (ref 135–145)

## 2012-08-27 MED ORDER — CHOLESTYRAMINE 4 G PO PACK: 1.0000 | PACK | Freq: Every day | ORAL | Status: DC

## 2012-08-27 NOTE — Patient Instructions (Addendum)
Please start questran powder, one pack per day. You may be able to cut back on imodium. Call to report on your symptoms, diarrhea in 4-5 weeks. You do not need routine colonoscopy until Feb 2015, already in our reminder system. You will have labs checked today in the basement lab.  Please head down after you check out with the front desk  (bmet) for arm cramps.                                               We are excited to introduce MyChart, a new best-in-class service that provides you online access to important information in your electronic medical record. We want to make it easier for you to view your health information - all in one secure location - when and where you need it. We expect MyChart will enhance the quality of care and service we provide.  When you register for MyChart, you can:    View your test results.    Request appointments and receive appointment reminders via email.    Request medication renewals.    View your medical history, allergies, medications and immunizations.    Communicate with your physician's office through a password-protected site.    Conveniently print information such as your medication lists.  To find out if MyChart is right for you, please talk to a member of our clinical staff today. We will gladly answer your questions about this free health and wellness tool.  If you are age 69 or older and want a member of your family to have access to your record, you must provide written consent by completing a proxy form available at our office. Please speak to our clinical staff about guidelines regarding accounts for patients younger than age 90.  As you activate your MyChart account and need any technical assistance, please call the MyChart technical support line at [phone]) or email your question to [email or web address]. If you email your question(s), please include your name, a return phone number and the best time to reach  you.  If you have non-urgent health-related questions, you can send a message to our office through MyChart at [email or web address]. If you have a medical emergency, call 911.  Thank you for using MyChart as your new health and wellness resource!   MyChart licensed from [employer],  2956-2130. Patents Pending.

## 2012-08-27 NOTE — Progress Notes (Signed)
Review of pertinent gastrointestinal problems: 1. History of duodenal carcinoid. Initially by Dr. Mechele Collin then underwent endoscopic ultrasound June 20, 2007. US showed no involvement of the muscularis propria and so EMR was proceeded with. Unfortunate complication of duodenal perforation, although pathology showed complete removal of the duodenal small low-grade neuroendocrine tumor, well circumscribed duodenal carcinoid. Did not involve the muscularis propria or deep margin of the specimen. She underwent emergency surgical oversewing of the iatrogenic perforation.  2. Chronic diarrhea, started mid 2008, workup by Dr. Mechele Collin, colonoscopy was negative, upper endoscopy showed the carcinoid above. I performed a 24-hour urine collection for 5-HIAA and this was indeed slightly elevated at 11.5, normal is less than 6. This is elevated but is much less than is normally seen for carcinoid syndrome. CT scan abdomen and pelvis suggested an 11 mm hypodensity in the head of the pancreas, otherwise essentially normal. Upper EUS June 2006 showed small cyst in head of pancreas, this was aspirated and there were no signs of malignancy.Repeat 24 hour urine collection showed normal 5 HIAA levels, July 2009. Colonoscopy February 2010 found hemicolectomy anastomosis. Otherwise normal. Biopsies randomly were all normal  3. Colon Cancer: s/p 2004, did not need chemo or xrt; last colonoscopy February 2010, next February 2015   HPI: This is a  very pleasant 72 year old woman whom I last saw about a year ago.   Had acute cholecystitis, eventually underwent lap cholecystectomy with Dr. Michela Pitcher 05/27/12.  She does feel better since surgery however she still has some abdominal pains, stayed in hosp another 2-3 days longer than usual.  A lot of adhesions.  Takes 4 imodium at most per day.  This is for her usual chronic diarrhea which has been thoroughly investigated. See above.  She was told that she is "due for another  colonoscopy" at this point by a different office.   Past Medical History  Diagnosis Date  . Unspecified ectopic pregnancy without intrauterine pregnancy   . Headache   . Depressive disorder, not elsewhere classified   . Arthropathy, unspecified, site unspecified   . Anxiety state, unspecified   . Hypertension   . Diarrhea   . Benign carcinoid tumor of the duodenum   . Hyperlipidemia   . OA (osteoarthritis) of knee     right  . GERD (gastroesophageal reflux disease)   . Syncope and collapse   . Cancer     breast  . Colon cancer   . Esophageal cancer   . Esophageal cancer   . Kidney stones   . Disease of gallbladder   . Blindness of one eye     Past Surgical History  Procedure Laterality Date  . Colon resection    . Breast surgery Right   . Colonoscopy    . Total knee arthroplasty Right   . Partial hysterectomy    . Cholecystectomy  05-27-12    Current Outpatient Prescriptions  Medication Sig Dispense Refill  . acetaminophen (TYLENOL) 650 MG CR tablet Take 650 mg by mouth as needed.      Marland Kitchen acetaminophen-codeine (TYLENOL #3) 300-30 MG per tablet Take 1 tablet by mouth as needed.      Marland Kitchen amLODipine (NORVASC) 10 MG tablet Take 10 mg by mouth daily.      Marland Kitchen aspirin 81 MG EC tablet Take 81 mg by mouth daily.       . bimatoprost (LUMIGAN) 0.01 % SOLN 1 drop at bedtime.      . Brimonidine Tartrate-Timolol (COMBIGAN OP) Apply to eye.      Marland Kitchen  brinzolamide (AZOPT) 1 % ophthalmic suspension Place 1 drop into both eyes 2 (two) times daily.      . Calcium Carbonate-Vit D-Min (CALCIUM 600+D PLUS MINERALS) 600-400 MG-UNIT TABS Take by mouth 2 (two) times daily.      . clonazePAM (KLONOPIN) 1 MG tablet Takes 1 to 1 1/2 tablets daily as needed.      . Loperamide HCl (ANTI-DIARRHEAL PO) Take by mouth. As needed       . omeprazole (PRILOSEC) 40 MG capsule Take 40 mg by mouth daily.      . ondansetron (ZOFRAN) 4 MG tablet Take 4 mg by mouth every 6 (six) hours as needed.      . potassium  chloride (K-DUR,KLOR-CON) 10 MEQ tablet Take 10 mEq by mouth daily.      . sertraline (ZOLOFT) 100 MG tablet Take 100 mg by mouth daily.        No current facility-administered medications for this visit.    Allergies as of 08/27/2012 - Review Complete 08/27/2012  Allergen Reaction Noted  . Iohexol  12/31/2008  . Morphine      Family History  Problem Relation Age of Onset  . Heart attack Brother   . Cancer Mother     unknown type  . Diabetes Daughter     History   Social History  . Marital Status: Married    Spouse Name: N/A    Number of Children: N/A  . Years of Education: N/A   Occupational History  . Not on file.   Social History Main Topics  . Smoking status: Never Smoker   . Smokeless tobacco: Never Used  . Alcohol Use: No  . Drug Use: No  . Sexually Active: Not on file   Other Topics Concern  . Not on file   Social History Narrative  . No narrative on file      Physical Exam: BP 120/86  Pulse 60  Ht 5\' 7"  (1.702 m)  Wt 174 lb (78.926 kg)  BMI 27.25 kg/m2 Constitutional: generally well-appearing Psychiatric: alert and oriented x3 Abdomen: soft, nontender, nondistended, no obvious ascites, no peritoneal signs, normal bowel sounds     Assessment and plan: 72 y.o. female with  chronic diarrhea  I did not mention above that she also has been having some cramping in her arms. I'm going to just basic metabolic profile drawn for her to see if this is an electrolyte abnormality she is not due for another colonoscopy until about a year from now. Since her gallbladder has been removed, bile salt related diarrhea can contribute to her previous chronic diarrhea.: Her a prescription for Questran, she will take this 1 packet once daily and will call to report on her symptoms in about a month.

## 2013-03-14 ENCOUNTER — Ambulatory Visit: Payer: Self-pay | Admitting: Family Medicine

## 2013-05-21 ENCOUNTER — Encounter: Payer: Self-pay | Admitting: Gastroenterology

## 2013-09-12 ENCOUNTER — Ambulatory Visit: Payer: Self-pay

## 2014-01-12 ENCOUNTER — Ambulatory Visit (INDEPENDENT_AMBULATORY_CARE_PROVIDER_SITE_OTHER): Payer: Medicare PPO | Admitting: Gastroenterology

## 2014-01-12 ENCOUNTER — Ambulatory Visit: Payer: Medicare PPO | Admitting: Gastroenterology

## 2014-01-12 ENCOUNTER — Other Ambulatory Visit (INDEPENDENT_AMBULATORY_CARE_PROVIDER_SITE_OTHER): Payer: Medicare PPO

## 2014-01-12 ENCOUNTER — Encounter: Payer: Self-pay | Admitting: Gastroenterology

## 2014-01-12 VITALS — BP 110/90 | HR 64 | Ht 65.0 in | Wt 164.4 lb

## 2014-01-12 DIAGNOSIS — R197 Diarrhea, unspecified: Secondary | ICD-10-CM

## 2014-01-12 DIAGNOSIS — Z85038 Personal history of other malignant neoplasm of large intestine: Secondary | ICD-10-CM

## 2014-01-12 LAB — CBC WITH DIFFERENTIAL/PLATELET
Basophils Absolute: 0 10*3/uL (ref 0.0–0.1)
Basophils Relative: 0.5 % (ref 0.0–3.0)
Eosinophils Absolute: 0.1 10*3/uL (ref 0.0–0.7)
Eosinophils Relative: 2.1 % (ref 0.0–5.0)
HCT: 38.5 % (ref 36.0–46.0)
HEMOGLOBIN: 12.7 g/dL (ref 12.0–15.0)
LYMPHS PCT: 35.9 % (ref 12.0–46.0)
Lymphs Abs: 1.9 10*3/uL (ref 0.7–4.0)
MCHC: 32.9 g/dL (ref 30.0–36.0)
MCV: 91 fl (ref 78.0–100.0)
MONOS PCT: 10.7 % (ref 3.0–12.0)
Monocytes Absolute: 0.6 10*3/uL (ref 0.1–1.0)
NEUTROS ABS: 2.7 10*3/uL (ref 1.4–7.7)
Neutrophils Relative %: 50.8 % (ref 43.0–77.0)
PLATELETS: 203 10*3/uL (ref 150.0–400.0)
RBC: 4.24 Mil/uL (ref 3.87–5.11)
RDW: 14.5 % (ref 11.5–15.5)
WBC: 5.2 10*3/uL (ref 4.0–10.5)

## 2014-01-12 LAB — COMPREHENSIVE METABOLIC PANEL
ALBUMIN: 3.9 g/dL (ref 3.5–5.2)
ALT: 18 U/L (ref 0–35)
AST: 24 U/L (ref 0–37)
Alkaline Phosphatase: 67 U/L (ref 39–117)
BUN: 12 mg/dL (ref 6–23)
CALCIUM: 9.6 mg/dL (ref 8.4–10.5)
CHLORIDE: 108 meq/L (ref 96–112)
CO2: 28 meq/L (ref 19–32)
Creatinine, Ser: 0.9 mg/dL (ref 0.4–1.2)
GFR: 76.08 mL/min (ref >60.00)
Glucose, Bld: 92 mg/dL (ref 70–99)
Potassium: 3.5 mEq/L (ref 3.5–5.1)
SODIUM: 141 meq/L (ref 135–145)
TOTAL PROTEIN: 7.5 g/dL (ref 6.0–8.3)
Total Bilirubin: 0.5 mg/dL (ref 0.2–1.2)

## 2014-01-12 LAB — SEDIMENTATION RATE: Sed Rate: 28 mm/hr — ABNORMAL HIGH (ref 0–22)

## 2014-01-12 LAB — TSH: TSH: 0.42 u[IU]/mL (ref 0.35–4.50)

## 2014-01-12 MED ORDER — MOVIPREP 100 G PO SOLR: 1.0000 | Freq: Once | ORAL | Status: DC

## 2014-01-12 MED ORDER — CHOLESTYRAMINE 4 G PO PACK: 4.0000 g | PACK | Freq: Two times a day (BID) | ORAL | Status: DC

## 2014-01-12 NOTE — Progress Notes (Signed)
Review of pertinent gastrointestinal problems:  1. History of duodenal carcinoid. Initially by Dr. Vira Agar then underwent endoscopic ultrasound June 20, 2007. US showed no involvement of the muscularis propria and so EMR was proceeded with. Unfortunate complication of duodenal perforation, although pathology showed complete removal of the duodenal small low-grade neuroendocrine tumor, well circumscribed duodenal carcinoid. Did not involve the muscularis propria or deep margin of the specimen. [name] underwent emergency surgical oversewing of the iatrogenic perforation.  2. Chronic diarrhea, started mid 2008, workup by Dr. Vira Agar, colonoscopy was negative, upper endoscopy showed the carcinoid above. I performed Allison 24-hour urine collection for 5-HIAA and this was indeed slightly elevated at 11.5, normal is less than 6. This is elevated but is much less than is normally seen for carcinoid syndrome. CT scan abdomen and pelvis suggested an 11 mm hypodensity in the head of the pancreas, otherwise essentially normal. Upper EUS June 2006 showed small cyst in head of pancreas, this was aspirated and there were no signs of malignancy.Repeat 24 hour urine collection showed normal 5 HIAA levels, July 2009. Colonoscopy February 2010 found hemicolectomy anastomosis. Otherwise normal. Biopsies randomly were all normal  3. Colon Cancer: s/p 2004, did not need chemo or xrt; last colonoscopy February 2010, next February 2015   HPI: This is Allison  very pleasant 73 year old Robertson whom I last saw Allison little over Allison year ago. [name] is here with her granddaughter today.  [name] is complaining of continued, persistent diarrhea. It is nonbloody. This is been Allison problem for her for many years. Was markedly worsened after [name] had her gallbladder removed in 2013. About Allison year ago recommended [name] try cholestyramine and [name] tells me that [name] did and it didn't work and also cause some dyspepsia-like side effects.    Past Medical History   Diagnosis Date  . Unspecified ectopic pregnancy without intrauterine pregnancy   . Headache(784.0)   . Depressive disorder, not elsewhere classified   . Arthropathy, unspecified, site unspecified   . Anxiety state, unspecified   . Hypertension   . Diarrhea   . Benign carcinoid tumor of the duodenum   . Hyperlipidemia   . OA (osteoarthritis) of knee     right  . GERD (gastroesophageal reflux disease)   . Syncope and collapse   . Cancer     breast  . Colon cancer   . Esophageal cancer   . Kidney stones   . Disease of gallbladder   . Blindness of one eye     Past Surgical History  Procedure Laterality Date  . Colon resection    . Breast surgery Right   . Colonoscopy    . Total knee arthroplasty Right   . Partial hysterectomy    . Cholecystectomy  05-27-12    Current Outpatient Prescriptions  Medication Sig Dispense Refill  . amLODipine (NORVASC) 10 MG tablet Take 10 mg by mouth daily.      Marland Kitchen aspirin 81 MG EC tablet Take 81 mg by mouth daily.       . Brimonidine Tartrate-Timolol (COMBIGAN OP) Place 1 drop into both eyes 2 (two) times daily.       . cholestyramine (QUESTRAN) 4 G packet Take 1 packet by mouth daily before breakfast.  30 each  12  . clonazePAM (KLONOPIN) 1 MG tablet Takes 1 to 1 1/2 tablets daily as needed.      . latanoprost (XALATAN) 0.005 % ophthalmic solution Place 1 drop into both eyes at bedtime.      Marland Kitchen  Loperamide HCl (ANTI-DIARRHEAL PO) Take by mouth. As needed       . sertraline (ZOLOFT) 100 MG tablet Take 100 mg by mouth daily.       . timolol (BETIMOL) 0.5 % ophthalmic solution Place 1 drop into both eyes 2 (two) times daily.       No current facility-administered medications for this visit.    Allergies as of 01/12/2014 - Review Complete 01/12/2014  Allergen Reaction Noted  . Iohexol  12/31/2008  . Morphine      Family History  Problem Relation Age of Onset  . Heart attack Brother   . Cancer Mother     unknown type  . Diabetes Daughter      History   Social History  . Marital Status: Married    Spouse Name: N/Allison    Number of Children: N/Allison  . Years of Education: N/Allison   Occupational History  . Not on file.   Social History Main Topics  . Smoking status: Never Smoker   . Smokeless tobacco: Never Used  . Alcohol Use: No  . Drug Use: No  . Sexual Activity: Not on file   Other Topics Concern  . Not on file   Social History Narrative  . No narrative on file      Physical Exam: BP 110/90  Pulse 64  Ht 5\' 5"  (1.651 m)  Wt 164 lb 6 oz (74.56 kg)  BMI 27.35 kg/m2 Constitutional: generally well-appearing Psychiatric: alert and oriented x3 Abdomen: soft, nontender, nondistended, no obvious ascites, no peritoneal signs, normal bowel sounds     Assessment and plan: 73 y.o. female with persistent chronic diarrhea, personal history of colon cancer  [name] had Allison right hemicolectomy many years ago and has had her gallbladder removed. This puts her at high risk for bile acid related diarrhea and epigastric he retry cholestyramine 2 packets per day with food. [name] will continue taking her Imodium which [name] currently takes about 5 or 6 pills every day. [name]'ll have Allison basic workup for chronic diarrhea again include stool pathogen panel, sedimentation rate, CBC and complete metabolic profile. [name] is due for colonoscopy for colon cancer followup and we will set that up for her at her soonest convenience.

## 2014-01-12 NOTE — Patient Instructions (Signed)
You will be set up for a colonoscopy for h/o colon cancer. You will have labs checked today in the basement lab.  Please head down after you check out with the front desk  (stool for stool pathogen panel, labs cbc, cmet, tsh, exr). Retry cholestyramine powder, one packet twice daily with food.

## 2014-01-16 ENCOUNTER — Other Ambulatory Visit: Payer: Medicare PPO

## 2014-01-16 DIAGNOSIS — Z85038 Personal history of other malignant neoplasm of large intestine: Secondary | ICD-10-CM

## 2014-01-16 DIAGNOSIS — R197 Diarrhea, unspecified: Secondary | ICD-10-CM

## 2014-01-19 LAB — GASTROINTESTINAL PATHOGEN PANEL PCR
C. difficile Tox A/B, PCR: NEGATIVE
Campylobacter, PCR: NEGATIVE
Cryptosporidium, PCR: NEGATIVE
E COLI (STEC) STX1/STX2, PCR: NEGATIVE
E coli (ETEC) LT/ST PCR: NEGATIVE
E coli 0157, PCR: NEGATIVE
Giardia lamblia, PCR: NEGATIVE
NOROVIRUS, PCR: NEGATIVE
Rotavirus A, PCR: NEGATIVE
SALMONELLA, PCR: NEGATIVE
Shigella, PCR: NEGATIVE

## 2014-01-29 ENCOUNTER — Encounter: Payer: Self-pay | Admitting: Gastroenterology

## 2014-03-04 ENCOUNTER — Encounter: Payer: Self-pay | Admitting: Gastroenterology

## 2014-03-04 ENCOUNTER — Ambulatory Visit (AMBULATORY_SURGERY_CENTER): Payer: Medicare PPO | Admitting: Gastroenterology

## 2014-03-04 VITALS — BP 119/68 | HR 45 | Temp 98.1°F | Resp 20 | Ht 65.0 in | Wt 164.0 lb

## 2014-03-04 DIAGNOSIS — R197 Diarrhea, unspecified: Secondary | ICD-10-CM

## 2014-03-04 DIAGNOSIS — Z85038 Personal history of other malignant neoplasm of large intestine: Secondary | ICD-10-CM

## 2014-03-04 DIAGNOSIS — D125 Benign neoplasm of sigmoid colon: Secondary | ICD-10-CM

## 2014-03-04 DIAGNOSIS — D126 Benign neoplasm of colon, unspecified: Secondary | ICD-10-CM

## 2014-03-04 MED ORDER — SODIUM CHLORIDE 0.9 % IV SOLN: 500.0000 mL | INTRAVENOUS | Status: DC

## 2014-03-04 MED ORDER — COLESEVELAM HCL 625 MG PO TABS: 625.0000 mg | ORAL_TABLET | Freq: Two times a day (BID) | ORAL | Status: DC

## 2014-03-04 NOTE — Progress Notes (Signed)
Report to PACU, RN, vss, BBS= Clear.  

## 2014-03-04 NOTE — Patient Instructions (Signed)
Discharge instructions given with verbal understanding. Handout on polyps. Biopsies taken. prescription  Sent in to pharmacy. YOU HAD AN ENDOSCOPIC PROCEDURE TODAY AT [facility]: Refer to the procedure report that was given to you for any specific questions about what was found during the examination.  If the procedure report does not answer your questions, please call your gastroenterologist to clarify.  If you requested that your care partner not be given the details of your procedure findings, then the procedure report has been included in a sealed envelope for you to review at your convenience later.  YOU SHOULD EXPECT: Some feelings of bloating in the abdomen. Passage of more gas than usual.  Walking can help get rid of the air that was put into your GI tract during the procedure and reduce the bloating. If you had a lower endoscopy (such as a colonoscopy or flexible sigmoidoscopy) you may notice spotting of blood in your stool or on the toilet paper. If you underwent a bowel prep for your procedure, then you may not have a normal bowel movement for a few days.  DIET: Your first meal following the procedure should be a light meal and then it is ok to progress to your normal diet.  A half-sandwich or bowl of soup is an example of a good first meal.  Heavy or fried foods are harder to digest and may make you feel nauseous or bloated.  Likewise meals heavy in dairy and vegetables can cause extra gas to form and this can also increase the bloating.  Drink plenty of fluids but you should avoid alcoholic beverages for 24 hours.  ACTIVITY: Your care partner should take you home directly after the procedure.  You should plan to take it easy, moving slowly for the rest of the day.  You can resume normal activity the day after the procedure however you should NOT DRIVE or use heavy machinery for 24 hours (because of the sedation medicines used during the test).    SYMPTOMS TO REPORT  IMMEDIATELY: A gastroenterologist can be reached at any hour.  During normal business hours, 8:30 AM to 5:00 PM Monday through Friday, call [phone].  After hours and on weekends, please call the GI answering service at [phone] who will take a message and have the physician on call contact you.   Following lower endoscopy (colonoscopy or flexible sigmoidoscopy):  Excessive amounts of blood in the stool  Significant tenderness or worsening of abdominal pains  Swelling of the abdomen that is new, acute  Fever of 100F or higher FOLLOW UP: If any biopsies were taken you will be contacted by phone or by letter within the next 1-3 weeks.  Call your gastroenterologist if you have not heard about the biopsies in 3 weeks.  Our staff will call the home number listed on your records the next business day following your procedure to check on you and address any questions or concerns that you may have at that time regarding the information given to you following your procedure. This is a courtesy call and so if there is no answer at the home number and we have not heard from you through the emergency physician on call, we will assume that you have returned to your regular daily activities without incident.  SIGNATURES/CONFIDENTIALITY: You and/or your care partner have signed paperwork which will be entered into your electronic medical record.  These signatures attest to the fact that that the information above on your After  Visit Summary has been reviewed and is understood.  Full responsibility of the confidentiality of this discharge information lies with you and/or your care-partner. 

## 2014-03-04 NOTE — Op Note (Signed)
[address] [place], [address]   COLONOSCOPY PROCEDURE REPORT  PATIENT: [name], [name]  MR#: [id number] BIRTHDATE: 02-09-1941 , 28  yrs. old GENDER: female ENDOSCOPIST: Milus Banister, MD PROCEDURE DATE:  03/04/2014 PROCEDURE:   Colonoscopy with biopsy and Colonoscopy with snare polypectomy First Screening Colonoscopy - Avg.  risk and is 50 yrs.  old or older - No.  Prior Negative Screening - Now for repeat screening. N/A  History of Adenoma - Now for follow-up colonoscopy & has been > or = to 3 yrs.  N/A  Polyps Removed Today? Yes. ASA CLASS:   Class III INDICATIONS:Colon cancer 2005, last screening exam 2010; also chronic diarrhea. MEDICATIONS: Monitored anesthesia care and Propofol 250 mg IV  DESCRIPTION OF PROCEDURE:   After the risks benefits and alternatives of the procedure were thoroughly explained, informed consent was obtained.  The digital rectal exam revealed no abnormalities of the rectum.   The LB [id number]  endoscope was introduced through the anus and advanced to the surgical anastomosis. No adverse events experienced.   The quality of the prep was excellent.  The instrument was then slowly withdrawn as the colon was fully examined.   COLON FINDINGS: A sessile polyp ranging between 3-54mm in size was found in the sigmoid colon.  A polypectomy was performed.  The resection was complete, the polyp tissue was completely retrieved and sent to histology (path jar 2).   There was evidence of a prior ileocolonic surgical anastomosis in the right colon.  The colon was randomly biopsied (path jar 1). Retroflexed views revealed no abnormalities. The time to cecum= NA.  Withdrawal time=NA.  The scope was withdrawn and the procedure completed. COMPLICATIONS: There were no immediate complications.  ENDOSCOPIC IMPRESSION: 1.   Sessile polyp ranging between 3-29mm in size was found in the sigmoid colon; polypectomy was performed 2.    There was evidence of a prior ileocolonic surgical anastomosis in the right colon, this was normal appearing. 3.   The colon was randomly biopsied  RECOMMENDATIONS: Await pathology. You will probably need repeat screening, surveillance colonoscopy in 5 years. Please start welchol trial for chronic diarrhea.   Call my office in 3-4 weeks to report on your response.  A new prescription was called in today.  eSigned:  Milus Banister, MD 03/04/2014 2:59 PM

## 2014-03-04 NOTE — Progress Notes (Signed)
Called to room to assist during endoscopic procedure.  Patient ID and intended procedure confirmed with present staff. Received instructions for my participation in the procedure from the performing physician.  

## 2014-03-05 ENCOUNTER — Telehealth: Payer: Self-pay

## 2014-03-05 NOTE — Telephone Encounter (Signed)
Unable to leave message line busy times two. 

## 2014-03-10 ENCOUNTER — Telehealth: Payer: Self-pay

## 2014-03-10 ENCOUNTER — Encounter: Payer: Self-pay | Admitting: Gastroenterology

## 2014-03-10 NOTE — Telephone Encounter (Signed)
Spoke w/ pt's granddaughter.  She reports pt's HR has been in the 40s since she had her colonoscopy. Reports that pt feels weak and tired. Advised her that we have not seen pt in almost 2 years and that needs to be evaluated.  Offered her appt to tomorrow afternoon, as she works at [employer] and needs later pm appt.  She will try to make arrangements and call back if she is unable to keep this.

## 2014-03-10 NOTE — Telephone Encounter (Signed)
Pt grandaughter called, states pt HR has been in the 40s. Please call. Cell or work [phone]

## 2014-03-11 ENCOUNTER — Ambulatory Visit (INDEPENDENT_AMBULATORY_CARE_PROVIDER_SITE_OTHER): Payer: Medicare PPO | Admitting: Cardiovascular Disease

## 2014-03-11 ENCOUNTER — Encounter: Payer: Self-pay | Admitting: Cardiovascular Disease

## 2014-03-11 VITALS — BP 112/80 | HR 54 | Ht 67.0 in | Wt 158.2 lb

## 2014-03-11 DIAGNOSIS — R001 Bradycardia, unspecified: Secondary | ICD-10-CM

## 2014-03-11 DIAGNOSIS — R531 Weakness: Secondary | ICD-10-CM

## 2014-03-11 DIAGNOSIS — R55 Syncope and collapse: Secondary | ICD-10-CM

## 2014-03-11 DIAGNOSIS — E785 Hyperlipidemia, unspecified: Secondary | ICD-10-CM

## 2014-03-11 DIAGNOSIS — R42 Dizziness and giddiness: Secondary | ICD-10-CM

## 2014-03-11 DIAGNOSIS — R197 Diarrhea, unspecified: Secondary | ICD-10-CM

## 2014-03-11 DIAGNOSIS — I10 Essential (primary) hypertension: Secondary | ICD-10-CM

## 2014-03-11 NOTE — Assessment & Plan Note (Signed)
Long history of bradycardia, asymptomatic. Recent bradycardia during colonoscopy, slow rate into the 40s with anesthesia. As she is asymptomatic, will monitor for now. We'll hold amlodipine as above given her low blood pressure

## 2014-03-11 NOTE — Progress Notes (Signed)
Patient ID: [name], female    DOB: July 25, 1940, 73 y.o.   MRN: [id number]  HPI Comments: 5 -year-old woman with a history of syncope, who presented to [facility] in 5/11 for symptoms of chest pain and dizziness/vertigo and was discharged after her cardiac workup was negative with the diagnosis of possible labyrinthitis.  episode of neck and chest pain while at church after looking towards to neighbor, syncope while performing physical therapy in 2011 after total knee replacement, who presents for routine followup.  In followup today, she has had dramatic weight loss after her cholecystectomy. She has chronic diarrhea. Being managed by Dr. Ardis Hughs. She reports she has tried cholestyramine before and it did not Imodium is not working. Weight is down more than 20 pounds. She reports increasing dizziness when standing. Granddaughter is concerned about bradycardia during the colonoscopy procedure. Records indicate heart rates in the 40s and 50s. Blood pressure dropped after anesthesia Otherwise no new events  Previous Event monitor was performed that showed APCs with normal rhythm. Previous event monitor in early 2012 did not show any significant arrhythmia  Recent admission to the hospital for abdominal pain found to have gallbladder disease. She scheduled for gallbladder surgery in several days' time.    She had a Lexiscan myoview in 5/11.  This showed breast attenuation artifact in the anterior region though they could not definitively exclude mild ischemia.    total knee replacement on the right in November 2011. She has been participating in physical therapy. 2 near syncopal episodes while performing aggressive physical therapy while in a standing position. Both episodes, she was hypotensive. First episode she had pressure of 90/50, second episode blood pressure was 79/50. IV fluid was started on a second episode. The second episode was on June 30 2011   ECG:  normal sinus rhythm with rate 58 beats per minute with nonspecific T wave abnormality in V1 through V3     Outpatient Encounter Prescriptions as of 03/11/2014  Medication Sig  . aspirin 81 MG EC tablet Take 81 mg by mouth daily.   . Brimonidine Tartrate-Timolol (COMBIGAN OP) Place 1 drop into both eyes 2 (two) times daily.   . clonazePAM (KLONOPIN) 1 MG tablet Takes 1 to 1 1/2 tablets daily as needed.  . latanoprost (XALATAN) 0.005 % ophthalmic solution Place 1 drop into both eyes at bedtime.  . Loperamide HCl (ANTI-DIARRHEAL PO) Take by mouth. As needed   . sertraline (ZOLOFT) 100 MG tablet Take 100 mg by mouth daily.   . timolol (BETIMOL) 0.5 % ophthalmic solution Place 1 drop into both eyes 2 (two) times daily.  Marland Kitchen  amLODipine (NORVASC) 10 MG tablet Take 10 mg by mouth daily.   Review of Systems  Constitutional: Negative.   HENT: Negative.   Eyes: Negative.   Respiratory: Negative.   Cardiovascular: Negative.   Gastrointestinal: Negative.   Endocrine: Negative.   Musculoskeletal: Negative.   Skin: Negative.   Allergic/Immunologic: Negative.   Neurological: Negative.   Hematological: Negative.   Psychiatric/Behavioral: Negative.   All other systems reviewed and are negative.   BP 112/80  Pulse 54  Ht 5\' 7"  (1.702 m)  Wt 158 lb 4 oz (71.782 kg)  BMI 24.78 kg/m2  Physical Exam  Nursing note and vitals reviewed. Constitutional: She is oriented to person, place, and time. She appears well-developed and well-nourished.  HENT:  Head: Normocephalic.  Nose: Nose normal.  Mouth/Throat: Oropharynx is clear and moist.  Eyes: Conjunctivae are normal.  Pupils are equal, round, and reactive to light.  Neck: Normal range of motion. Neck supple. No JVD present.  Cardiovascular: Normal rate, regular rhythm, S1 normal, S2 normal, normal heart sounds and intact distal pulses.  Exam reveals no gallop and no friction rub.   No murmur heard. Pulmonary/Chest: Effort normal and breath sounds  normal. No respiratory distress. She has no wheezes. She has no rales. She exhibits no tenderness.  Abdominal: Soft. Bowel sounds are normal. She exhibits no distension. There is no tenderness.  Musculoskeletal: Normal range of motion. She exhibits no edema and no tenderness.  Lymphadenopathy:    She has no cervical adenopathy.  Neurological: She is alert and oriented to person, place, and time. Coordination normal.  Skin: Skin is warm and dry. No rash noted. No erythema.  Psychiatric: She has a normal mood and affect. Her behavior is normal. Judgment and thought content normal.    Assessment and Plan

## 2014-03-11 NOTE — Assessment & Plan Note (Signed)
Currently not taking WelChol. Recent dramatic weight loss will likely lead to a drop in her cholesterol numbers

## 2014-03-11 NOTE — Patient Instructions (Signed)
Please stop the amlodipine, blood pressure is low  Please call us if you have new issues that need to be addressed before your next appt.  Your physician wants you to follow-up in: 6 months.  You will receive a reminder letter in the mail two months in advance. If you don't receive a letter, please call our office to schedule the follow-up appointment.

## 2014-03-11 NOTE — Assessment & Plan Note (Signed)
No recent episodes of syncope. She does have dizziness with standing concerning for orthostasis. Holding amlodipine

## 2014-03-11 NOTE — Assessment & Plan Note (Signed)
Previously had hypertension, on amlodipine. Since her dramatic weight loss, blood pressure now low with symptoms of orthostasis. We'll hold her amlodipine.

## 2014-03-11 NOTE — Assessment & Plan Note (Signed)
She reports that chronic diarrhea has contributed to her more than 20 pound weight loss. She is scheduled to retry cholestyramine.

## 2014-03-25 ENCOUNTER — Ambulatory Visit: Payer: Medicare PPO | Admitting: Cardiovascular Disease

## 2014-07-21 ENCOUNTER — Ambulatory Visit (INDEPENDENT_AMBULATORY_CARE_PROVIDER_SITE_OTHER): Payer: Medicare PPO | Admitting: Cardiovascular Disease

## 2014-07-21 ENCOUNTER — Encounter: Payer: Self-pay | Admitting: Cardiovascular Disease

## 2014-07-21 VITALS — BP 126/92 | HR 83 | Ht 67.0 in | Wt 164.8 lb

## 2014-07-21 DIAGNOSIS — R55 Syncope and collapse: Secondary | ICD-10-CM

## 2014-07-21 DIAGNOSIS — I10 Essential (primary) hypertension: Secondary | ICD-10-CM

## 2014-07-21 DIAGNOSIS — R197 Diarrhea, unspecified: Secondary | ICD-10-CM

## 2014-07-21 DIAGNOSIS — R001 Bradycardia, unspecified: Secondary | ICD-10-CM

## 2014-07-21 NOTE — Patient Instructions (Addendum)
You had a vasovagal epsiode, response to the upset stomach/belly pain Causing a drop in blood pressure and heart rate This temporary,   lay flat on the floor if you feel dizzy episodes coming on again Otherwise You are doing well.  Take imodium daily, up to 2 to 3 times a day for diarrhea Consider wearing the compression hose and belly binder/back brace to keep the blood pressure up as needed in the setting of bad stomach pain Stay hydrated to help avoid drops in blood pressure  Cut the amlodipine down to 5 mg once a day  Please call us if you have new issues that need to be addressed before your next appt.  Your physician wants you to follow-up in: 6 months.  You will receive a reminder letter in the mail two months in advance. If you don't receive a letter, please call our office to schedule the follow-up appointment.

## 2014-07-21 NOTE — Assessment & Plan Note (Signed)
Blood pressure adequate but in the setting of recent syncope, we have suggested she decrease the amlodipine down to 5 mg daily to avoid orthostasis

## 2014-07-21 NOTE — Assessment & Plan Note (Signed)
Recommended she retry Imodium, high-fiber foods. High fluid intake, consider compression hose and abdominal binder for any symptoms of abdominal discomfort.

## 2014-07-21 NOTE — Assessment & Plan Note (Signed)
Episodes of bradycardia likely in the setting of a trigger such as abdominal pain, nausea vomiting. Heart rate today well-controlled.

## 2014-07-21 NOTE — Assessment & Plan Note (Signed)
Recent episode of syncope in the setting of nausea vomiting, likely vasovagal.  Recommended abdominal binder, compression hose, fluid hydration. Suggested she lay supine for recurrent symptoms.

## 2014-07-21 NOTE — Progress Notes (Signed)
Patient ID: [name], female    DOB: 1941-01-21, 74 y.o.   MRN: [id number]  HPI Comments: 3 -year-old woman with a history of syncope, who presented to [facility] in 5/11 for symptoms of chest pain and dizziness/vertigo and was discharged after her cardiac workup was negative with the diagnosis of possible labyrinthitis.  episode of neck and chest pain while at church after looking towards to neighbor, syncope while performing physical therapy in 2011 after total knee replacement, who presents for routine followup for her syncope  In follow-up today, she reports that she was recently at the eye doctor 2 weeks ago. She had acute abdominal pain, nausea. She requested to a family member to get her a glass of water. When they came back, she was on the ground, had vomited all over herself. Further evaluation showed heart rate in the 40s, low blood pressure. Sugars were 110. She lay on the ground, had some fluids, recovered and then went home. No further episodes since that time. She reports that she continues to have diarrhea. She's not taking Imodium on a regular basis. Rarely does she have nausea vomiting. Predominantly irritable bowel/dumping syndrome Prior episode of near syncope or syncope happened many months ago when she was a passenger in a car and she acutely had lightheadedness, near syncope. Previously amlodipine was held in the setting of orthostasis and syncope. This is back on her list again  EKG on today's visit shows normal sinus rhythm with rate 83 bpm, T-wave abnormality through V1 through V4, inferior leads orthostatics done in the office today show heart rate 77 supine, 90 with standing, blood pressure 133/91 supine, sitting 114/79, standing 115/82 with recovery up to 128/91  Other past medical history dramatic weight loss after her cholecystectomy. She has chronic diarrhea. Being managed by Dr. Ardis Hughs. She reports she has tried cholestyramine before,  Imodium is not working.   bradycardia during the colonoscopy procedure. Records indicate heart rates in the 40s and 50s. Blood pressure dropped after anesthesia  Previous Event monitor was performed that showed APCs with normal rhythm. Previous event monitor in early 2012 did not show any significant arrhythmia  Recent admission to the hospital for abdominal pain found to have gallbladder disease. She scheduled for gallbladder surgery in several days' time.    She had a Lexiscan myoview in 5/11.  This showed breast attenuation artifact in the anterior region though they could not definitively exclude mild ischemia.    total knee replacement on the right in November 2011. She has been participating in physical therapy. 2 near syncopal episodes while performing aggressive physical therapy while in a standing position. Both episodes, she was hypotensive. First episode she had pressure of 90/50, second episode blood pressure was 79/50. IV fluid was started on a second episode. The second episode was on June 30 2011   Allergies  Allergen Reactions  . Iohexol      Code: HIVES, Desc: PTS GRANDDAUGHTER [name] CALLED 12/31/08 TO NOTIFY us THAT HER GRANDMOTHER HAD A REACTION TO THE IV DYE 8 HRS POST CT EXAM PT DEVELOPED HIVES/RASH.12/31/08/RM, Onset Date: [id number]   . Morphine     Outpatient Encounter Prescriptions as of 07/21/2014  Medication Sig  . amLODipine (NORVASC) 10 MG tablet Take 10 mg by mouth daily.  Marland Kitchen aspirin 81 MG EC tablet Take 81 mg by mouth daily.   . Brimonidine Tartrate-Timolol (COMBIGAN OP) Place 1 drop into both eyes 2 (two) times daily.   . clonazePAM (  KLONOPIN) 1 MG tablet Takes 1 to 1 1/2 tablets daily as needed.  . latanoprost (XALATAN) 0.005 % ophthalmic solution Place 1 drop into both eyes at bedtime.  . Loperamide HCl (ANTI-DIARRHEAL PO) Take by mouth. As needed   . omeprazole (PRILOSEC) 40 MG capsule Take 40 mg by mouth daily.  . sertraline (ZOLOFT) 100 MG tablet Take  100 mg by mouth daily.   . timolol (BETIMOL) 0.5 % ophthalmic solution Place 1 drop into both eyes 2 (two) times daily.    Past Medical History  Diagnosis Date  . Unspecified ectopic pregnancy without intrauterine pregnancy   . Headache(784.0)   . Depressive disorder, not elsewhere classified   . Arthropathy, unspecified, site unspecified   . Anxiety state, unspecified   . Hypertension   . Diarrhea   . Benign carcinoid tumor of the duodenum   . Hyperlipidemia   . OA (osteoarthritis) of knee     right  . GERD (gastroesophageal reflux disease)   . Syncope and collapse   . Cancer     breast  . Colon cancer   . Esophageal cancer   . Kidney stones   . Disease of gallbladder   . Blindness of one eye     Past Surgical History  Procedure Laterality Date  . Colon resection    . Breast surgery Right   . Colonoscopy    . Total knee arthroplasty Right   . Partial hysterectomy    . Cholecystectomy  05-27-12    Social History  reports that she has never smoked. She has never used smokeless tobacco. She reports that she does not drink alcohol or use illicit drugs.  Family History family history includes Cancer in her mother; Diabetes in her daughter; Heart attack in her brother.       Review of Systems  Constitutional: Negative.   HENT: Negative.   Respiratory: Negative.   Cardiovascular: Negative.   Gastrointestinal: Negative.   Musculoskeletal: Negative.   Skin: Negative.   Neurological: Positive for syncope.  Hematological: Negative.   Psychiatric/Behavioral: Negative.   All other systems reviewed and are negative.   BP 126/92 mmHg  Pulse 83  Ht 5\' 7"  (1.702 m)  Wt 164 lb 12 oz (74.73 kg)  BMI 25.80 kg/m2  Physical Exam  Constitutional: She is oriented to person, place, and time. She appears well-developed and well-nourished.  HENT:  Head: Normocephalic.  Nose: Nose normal.  Mouth/Throat: Oropharynx is clear and moist.  Eyes: Conjunctivae are normal.  Pupils are equal, round, and reactive to light.  Neck: Normal range of motion. Neck supple. No JVD present.  Cardiovascular: Normal rate, regular rhythm, S1 normal, S2 normal, normal heart sounds and intact distal pulses.  Exam reveals no gallop and no friction rub.   No murmur heard. Pulmonary/Chest: Effort normal and breath sounds normal. No respiratory distress. She has no wheezes. She has no rales. She exhibits no tenderness.  Abdominal: Soft. Bowel sounds are normal. She exhibits no distension. There is no tenderness.  Musculoskeletal: Normal range of motion. She exhibits no edema or tenderness.  Lymphadenopathy:    She has no cervical adenopathy.  Neurological: She is alert and oriented to person, place, and time. Coordination normal.  Skin: Skin is warm and dry. No rash noted. No erythema.  Psychiatric: She has a normal mood and affect. Her behavior is normal. Judgment and thought content normal.    Assessment and Plan  Nursing note and vitals reviewed.

## 2014-09-22 NOTE — Consult Note (Signed)
Brief Consult Note: Diagnosis: resp distress, HTN, hyperlipidemia, cholelithiasis.   Patient was seen by consultant.   Consult note dictated.   Orders entered.   Discussed with Attending MD.   Comments: 74y/o F with PMH of HTN, hyperlipidemia, cholelithiasis had cholecystectomy today adn postop resp distress  * Acute resp distress- hypoxic post op, possible pulm edema and atelectasis Improved with a small dose of lasix, CXR clear now has bibasilar atelectasis- incentive spirometry nebs prn  * HTN- home meds  * Hyperlipidmeia- cont home meds  * CHolelithiasis- s/p lap chole, mgmt per surgery.  Electronic Signatures: Gladstone Lighter (MD)  (Signed 23-Dec-13 16:05)  Authored: Brief Consult Note   Last Updated: 23-Dec-13 16:05 by Gladstone Lighter (MD)

## 2014-09-22 NOTE — Op Note (Signed)
PATIENT NAME:  [name], [name] MR#:  [id number] DATE OF BIRTH:  Dec 05, 1940  DATE OF PROCEDURE:  05/27/2012  PREOPERATIVE DIAGNOSIS: Chronic cholecystitis and cholelithiasis.   POSTOPERATIVE DIAGNOSIS: Chronic cholecystitis and cholelithiasis.   PROCEDURE: Laparoscopic cholecystectomy.   SURGEON: Rodena Goldmann, M.D.   ANESTHESIA: General.   DESCRIPTION OF PROCEDURE: With the patient in the supine position after the induction of appropriate general anesthesia, the patient's abdomen was prepped with ChloraPrep and draped with sterile towels. The patient was placed in the head down, feet up position. A small infraumbilical incision was made in the standard fashion and carried down bluntly through the subcutaneous tissue. The Veress needle was utilized in this case. A Visiport apparatus was used to cannulate the peritoneal cavity under direct vision. CO2 was insufflated. There did not appear to be any significant bowel or vascular injury. There were multiple adhesions to the anterior abdominal wall. A left upper quadrant transverse incision was made and an 11 mm port inserted in that area. The camera was moved to the upper port and the lower midline examined. There did not appear to be any evidence of any injury from cannulation. Adhesions were taken down exposing the right upper quadrant. What appeared to be the gallbladder was encased in adhesions. Two lateral ports 5 mm in size were placed under direct vision. The area which appeared to be the gallbladder was dissected free from the surrounding tissue, but upon further inspection it appeared to be a large hepatic cyst. More medial dissection demonstrated the gallbladder behind the duodenum looped over the gallbladder with multiple adhesions from a previous surgery. Very tedious dissection was required to expose the gallbladder. Once the hepatoduodenal ligament was visualized, the cystic artery and cystic duct were visualized. The common duct was then  dissected out and visualized. The area was quite difficult to dissect and I felt obtaining a cholangiogram was going to be quite difficult technically. She did have a large stone on ultrasound so the risks of common duct stones were small. The left upper quadrant incision was slightly enlarged and the port changed for a 12 mm port. An endo GIA stapling device was then used to divide the cystic duct. A blue load was utilized. The gallbladder was then retracted superiorly and laterally. The cystic artery was identified, doubly clipped and divided. The gallbladder was then dissected free from its bed and delivered using hook and cautery apparatus. Once the gallbladder was free it was retrieved with the large grasping instrument through the left upper quadrant incision. There did appear to be one large single stone. There area was copiously suctioned and irrigated. The left upper quadrant incision was closed with the suture passer and 0 Vicryl under direct vision. The abdomen was desufflated. All ports were withdrawn without difficulty. Skin incisions were closed with 5-0 nylon. Sterile dressings were applied after the introduction of 0.25% Marcaine for postoperative pain control. The patient was returned to the recovery room having tolerated the procedure well. Sponge, instrument and needle counts were correct x 2 in the operating room.  ____________________________ Rodena Goldmann III, MD rle:sb D: 05/27/2012 11:18:14 ET T: 05/27/2012 11:55:47 ET JOB#: [id number]  cc: Micheline Maze, MD, <Dictator> Sarah "Sallie" Posey Pronto, MD Rodena Goldmann MD ELECTRONICALLY SIGNED 05/29/2012 2:44

## 2014-09-22 NOTE — Consult Note (Signed)
PATIENT NAME:  [name], [name] MR#:  [id number] DATE OF BIRTH:  [birth year]  DATE OF CONSULTATION:  05/09/2012  REFERRING PHYSICIAN:   CONSULTING PHYSICIAN:  Micheline Maze, MD  PRIMARY CARE PHYSICIAN: Dr. Denton Lank. CARDIOLOGIST:  Dr. Esmond Plants.  ADMITTING PHYSICIAN: Prime Doc.   CHIEF COMPLAINT: Abdominal pain, weakness and dizziness.   HISTORY: [name] is a 74 year old woman with a history significant for hypertension, weakness and dizziness admitted on several occasions for similar symptoms. She also has noted some mild right upper quadrant discomfort which has worsened over the last several weeks. She became so bothered by it yesterday that she became diaphoretic and dizzy and was brought to the hospital by her granddaughter. Heart rate was in the 40s to 50s confirmed by EKG. There was no sign of any myocardial changes. Her symptoms were felt to be related to her bradycardia. She did have some mild abdominal discomfort on examination so CT scan was performed. She did have a large gallstone and probably several smaller gallstones noted on her evaluation.   She does have history of some mild nausea, vomiting, indigestion, bloating, right upper quadrant discomfort. She had a previous colon cancer resection performed by Dr. Rochel Brome in 2008. She felt that her symptoms currently were likely related to her previous abdominal surgery. 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 She also has history of breast cancer with lumpectomy and radiation. She has had a partial hysterectomy. She had diagnosis of esophageal carcinoma with some sort of EGD treatment. There was a complication requiring laparotomy and all that surgery was performed at [facility]. I do not have any records from that admission and she does not have any specific details of the problem.   The surgery service was consulted for possible biliary tract disease.   PAST SURGICAL HISTORY: Noted above.   PAST MEDICAL  HISTORY: She also has history of significant hyperlipidemia, anxiety, depression and hypertension.   SOCIAL HISTORY: She is not a cigarette smoker and does not use any alcohol. She does have some social issues at the present time as her husband is under hospice care and is possibly terminally ill.   FAMILY HISTORY: Noncontributory.   MEDICATIONS: Outlined in her admission medication and do not include any significant anticoagulation other than aspirin.   ALLERGIES: She is allergic to morphine which makes her lightheaded and confuses her.   REVIEW OF SYSTEMS: Otherwise undertaken and with regard to the symptoms noted above, there are no other particular problems noted.   PHYSICAL EXAMINATION:  GENERAL: She is an alert, pleasant, cooperative woman in no significant distress.   VITAL SIGNS: Blood pressure 140/87. She is afebrile. Heart rate 46 and regular. Respiratory rate 20 and her oxygen saturation is 94% on room air.   HEENT: No scleral icterus. She has no facial deformities. No pupillary abnormalities.   NECK: Supple and nontender without adenopathy and I cannot palpate her thyroid. Trachea is midline.   CHEST: Very diminished breath sounds but are equal bilaterally with no adventitious sounds noted. She has normal pulmonary excursion.   CARDIAC: No murmurs or gallops to my ear and she seems to be in normal sinus rhythm.   ABDOMEN: Multiple surgical scars down the midline. She has some mild abdominal tenderness primarily in the right lower quadrant. She has no rebound, guarding, masses, or hernias noted. She does have active bowel sounds.   EXTREMITIES: Lower extremity exam reveals some mild edema. Full range of motion. No  deformities and good distal pulses.   LABORATORY, RADIOLOGICAL AND DIAGNOSTIC DATA: I have independently reviewed her CT scan. She does appear to have one large calcified gallstone and possibly several smaller ones. Her laboratory values revealed a potassium of 2.9  but otherwise no significant abnormalities and no white blood cell count. She is scheduled for an ultrasound this afternoon.   IMPRESSION/RECOMMENDATIONS: This woman probably does have symptomatic biliary tract disease. At the present time I would continue her cardiac work-up and replete her potassium. She will be a difficult surgical candidate because of her multiple previous surgeries. I would like to get the information regarding her esophageal resection and complication from [facility] in an effort to better plan a possible surgical intervention. This plan has been outlined to the patient and will await further information prior to considering any further intervention. She is in agreement with this plan.    ____________________________ Micheline Maze, MD rle:ap D: 05/09/2012 14:19:52 ET T: 05/09/2012 14:31:59 ET JOB#: [id number]  cc: Micheline Maze, MD, <Dictator> Sarah "Sallie" Posey Pronto, MD Minna Merritts, MD Rodena Goldmann MD ELECTRONICALLY SIGNED 05/09/2012 17:11

## 2014-09-22 NOTE — H&P (Signed)
PATIENT NAME:  [name], BURPEE MR#:  [id number] DATE OF BIRTH:  1941-03-29  DATE OF ADMISSION:  05/09/2012  PRIMARY CARE PHYSICIAN: Denton Lank, MD  CARDIOLOGIST: Ida Rogue, MD  CHIEF COMPLAINT: Dizziness.   HISTORY OF PRESENT ILLNESS: [name] is a 74 year old African American female with past medical history of systemic hypertension. She is troubled by nonspecific right-sided lower quadrant abdominal pain for the last three months. She also lately, for the last several weeks, has complained of dizziness. She saw her cardiologist, Dr. Rockey Situ, a few weeks ago and she had a Holter monitor. However, in the last couple of days, her dizziness has worsened and her granddaughter had noticed that she is having bradycardia stating that her heart rate is usually in the 70s, but now it is in the 40s. Her initial heart rate here was in the 40s, but by EKG it is a 51. The patient was dizzy and an attempt to walk her around the room revealed that she is dizzy and has tendency to fall. The patient was admitted for 24-hour observation and to monitor her for any significant bradycardia. The ER physician reported that she is not orthostatic by blood pressure and heart rate measurements. Regarding her right-sided abdominal pain, she is vague in her description. She refers to the right lower quadrant and sometimes right upper quadrant, but this is ongoing for about three months.   REVIEW OF SYSTEMS: CONSTITUTIONAL: Denies any fever. No chills. No fatigue. EYES: No blurring of vision. No double vision. ENT: No hearing impairment. No sore throat. No dysphagia. CARDIOVASCULAR: No chest pain. No shortness of breath. No edema. No syncope. RESPIRATORY: No shortness of breath. No cough. No chest pain. GASTROINTESTINAL: Apart from the right-sided abdominal pain, she has no nausea, no vomiting, no diarrhea, and no hematochezia. GENITOURINARY: No dysuria. No frequency of urination. MUSCULOSKELETAL: No joint pain or swelling. No  muscular pain or swelling. INTEGUMENTARY: No skin rash. No ulcers. NEUROLOGY: No focal weakness. No seizure activity. No headache. PSYCHIATRY: No anxiety. No depression, although she is in slight depressed mood given her husband being sick at home, under care of hospice. ENDOCRINE: No polyuria or polydipsia. No heat or cold intolerance. HEMATOLOGY: No easy bruisability. No lymph node enlargement.   PAST MEDICAL HISTORY:  1. Systemic hypertension.  2. History of colon resection.  3. Esophageal surgery.  4. History of chronic diarrhea.  5. Hyperlipidemia.  6. Colon cancer. 7. History of anxiety and depression.  8. History of esophageal cancer.   PAST SURGICAL HISTORY:  1. Esophageal cancer surgery. 2. Colon cancer resection. 3. Breast lumpectomy.  4. Partial hysterectomy. 5. Right total knee arthroplasty.   SOCIAL HABITS: Nonsmoker. No history of alcohol or drug abuse.   SOCIAL HISTORY: She is married and living with her husband who is now very sick and under the care of hospice.   FAMILY HISTORY: Her father died of unknown cancer. She has a sibling who has colon cancer and prostate cancer. Her mother died after she gave birth to the patient.   ADMISSION MEDICATIONS:  1. Potassium chloride 10 mEq a day.  2. Omeprazole 40 mg a day.  3. Loperamide 2 mg every other day. 4. Lumigan 0.01% to each eye once a day. 5. Ibuprofen three times daily p.r.n. 6. Combigan one drop in each eye twice a day. 7. Clonazepam 1 mg once a day.  8. Calcium with vitamin D. 9. Azopt ophthalmic. 10. Aspirin 81 mg a day. 11. Amlodipine 10 mg a day.  ALLERGIES: Morphine.   PHYSICAL EXAMINATION:   VITAL SIGNS: Blood pressure 152/94, respiratory rate 16, pulse 48 and regular, temperature 98.2, and pulse oximetry 96%.   GENERAL APPEARANCE: Elderly female lying in bed in no acute distress.   HEAD AND NECK: No pallor. No icterus. No cyanosis.   EAR: Normal hearing. No discharge from the ears. No lesions.    NOSE/THROAT: Nasal mucosa was normal without ulcer. No discharge. Oropharyngeal area showed no ulcers, no oral thrush.   EYES: Normal eyelids and conjunctiva. Pupils are about 3 to 4 mm. I could not detect reactivity to light due to lack of contrast between the pupil and the iris.   NECK: Supple. Trachea at midline. No thyromegaly. No cervical lymphadenopathy. No masses.   HEART: Normal S1 and S2. No S3 or S4. No murmur. No gallop. No carotid bruits.   LUNGS: Normal breathing pattern without use of accessory muscles. No rales. No wheezing.   ABDOMEN: Soft. There is mild discomfort upon deep palpation, nonlocalized, with right lower, right mid, and right upper quadrant area. Otherwise no rebound. No rigidity. No hepatosplenomegaly. No masses. No hernias.   SKIN: No ulcers. No subcutaneous nodules.   MUSCULOSKELETAL: No joint swelling. No clubbing.   NEURO: Cranial nerves II through XII were intact. No focal motor deficit.   PSYCHIATRIC: The patient is alert and oriented x3. Mood and affect were flat.   LABORATORY, DIAGNOSTIC AND RADIOLOGIC DATA: CAT scan of the abdomen showed large hiatal hernia, cholelithiasis without evidence of inflammation, colonic diverticulosis without acute diverticulitis, hepatic steatosis, and probable uterine fibroids.   Serum glucose 97, BUN 16, creatinine 0.9, sodium 143, and potassium 2.9. Normal liver function tests. Normal troponin at 0.02. CBC was normal.   Urinalysis was unremarkable.   ASSESSMENT:  1. Bradycardia: The ER physician was concerned whether her dizziness is related. I am not sure. She will be monitored over 24 hours for that. Of note, I reviewed her medical records and I noted that in 2011 she had dizziness at that time and vertigo attributed to her inner ear and at that time she had bradycardia and hypokalemia at the same time.  2. Hypokalemia.  3. Chronic right-sided abdominal pain, not very localized. This is ongoing for several  months. 4. Cholelithiasis without evidence of cholecystitis.  5. Diverticulosis without diverticulitis. 6. Hepatic steatosis.  7. Systemic hypertension.  8. Hyperlipidemia.  9. Chronic diarrhea. 10. History of colon cancer status post resection and history of esophageal cancer status post resection.  11. CAT scan also showed some multiple myometrial nodules consistent with fibroids, although her past medical history includes partial hysterectomy in the past. Therefore, we are unsure about this information.   PLAN: We will admit the patient on telemetry for observation to monitor for any arrhythmias or if there is any significant bradycardia. Since she had recent Holter monitor done with Dr. Rockey Situ, I will consult him for further input. We will treat her hypokalemia with potassium supplementation and gentle IV fluids and hydration. Regarding her right-sided nonspecific, nonlocalized abdominal pain, which has been chronic for several months, the patient needs to follow up with her primary care physician and possibly needs GI or GU consult on an outpatient basis. I will continue her home medications. The patient indicates that she does not have a Living Will and that her code status is FULL CODE.        TIME SPENT: Time Spent in evaluating this patient took more than 55 minutes including reviewing her medical  records and also back and forth discussion with her to have adequate information about the reason she came to the hospital as I got conflicting stories.  ____________________________ Clovis Pu. Lenore Manner, MD amd:slb D: 05/09/2012 04:43:35 ET T: 05/09/2012 08:11:46 ET JOB#: [id number]  cc: Clovis Pu. Lenore Manner, MD, <Dictator> Sarah "Sallie" Posey Pronto, MD Ellin Saba MD ELECTRONICALLY SIGNED 05/10/2012 21:26

## 2014-09-22 NOTE — Discharge Summary (Signed)
PATIENT NAME:  [name], [name] MR#:  [id number] DATE OF BIRTH:  1940-10-31  DATE OF ADMISSION:  05/09/2012 DATE OF DISCHARGE:  05/11/2012  PRIMARY CARE PHYSICIAN: Dr. Denton Lank  CONSULTANTS:  1. Dr. Dia Crawford. 2. Dr. Kathlyn Sacramento.   CHIEF COMPLAINT AT ADMISSION: Dizziness and abdominal pain on the right upper quadrant.   DISCHARGE DIAGNOSES: 1. Right upper quadrant abdominal pain due to cholelithiasis.  2. Hypertension.  3. Bradycardia.  4. Dizziness.  5. History of colon resection.  6. Esophageal surgery.  7. History of chronic diarrhea.  8. Hyperlipidemia.  9. History of colon cancer.  10. Anxiety and depression.  11. Glaucoma.   DISPOSITION: Home.   MEDICATIONS AT DISCHARGE:  1. Calcium plus vitamin D twice daily.  2. Amlodipine 10 mg once a day. 3. Aspirin 81 mg once a day. 4. Loperamide 2 mg orally every 48 hours p.r.n.  5. Potassium chloride 10 mg once a day.  6. Clonazepam 1 mg once a day at bedtime and 0.5 to 1 mg p.r.n. during daytime.  7. Omeprazole 40 mg once a day. 8. Tylenol p.r.n. pain. 9. Ibuprofen 800 mg 3 times a day p.r.n. pain. 10. Combigan 1 drop 2 times a day. 11. Lumigan one drop once a day. 12. Azopt one drop 2 times a day. 13. Zofran p.r.n. nausea. 14. Tylenol with Codeine #3 p.r.n. for pain.   FOLLOW UP: Follow up with Dr. Denton Lank in 2 to 4 weeks; Dr. Pat Patrick in 1 to 2 weeks.   HOSPITAL COURSE: [name] is a very nice 74 year old female who has history of hypertension who was admitted due to significant but chronic right side upper quadrant pain for over three months. She has been also complaining for dizziness within several weeks and when she saw her cardiologist, Dr. Rockey Situ, several weeks prior to admission she was put on a Holter monitor. Her dizziness got worse within the two days prior to admission and it was noticed that patient was bradycardic with a heart rate in the 40s. Whenever she gets dizzy she has tendency to fall for what she  was brought to the ER. The ER physician checked on her and there was no orthostatic changes of her blood pressure. Patient was seen by Dr. Dia Crawford due to abdominal pain. He determined that she had symptomatic biliary tract disease for what she probably would benefit from surgery although she had a significant amount of surgery on her abdomen including esophageal and colon surgery for what Dr. Pat Patrick thought that due to this she might need to wait a little bit instead of doing the case over the weekend. The patient actually is fine without any significant abdominal pain. Yesterday she had some right upper quadrant soreness but overall had improved significantly. She had ultrasound of the abdomen showed gallstones and sludge with no cholecystitis. Her weakness and dizziness with sinus bradycardia despite the fact of not taking medications to lower the heart rate got better. The patient was seen by Dr. Fletcher Anon who recommended no significant work up. She had an echocardiogram that apparently was technically difficult. She had an ejection fraction of 50% to 55%. Transmitral and spectral Doppler flow suggestive of left ventricular impaired relaxation with mild left ventricular hypertrophy. Right heart function is normal. The patient and I had a long talk about why to hold her surgery due to the complexity . We talk over the details of why it is better to wait. Patient is happy to go home with  nausea medication and Tylenol. At this moment, she is hemodynamically stable and able to go home. We are going to discharge her on Zofran and Tylenol #3 and her home medications.        TIME SPENT: I spent about 40 minutes with this discharge today.  ____________________________ Sandston Sink, MD rsg:cms D: 05/11/2012 15:25:23 ET T: 05/12/2012 11:30:07 ET JOB#: [id number]  cc: Jacumba Sink, MD, <Dictator> Allison "Sallie" Posey Pronto, MD  Cristi Loron MD ELECTRONICALLY SIGNED 05/19/2012  14:04

## 2014-09-22 NOTE — Consult Note (Signed)
General Aspect this is a pleasant 74 year old female with a history of syncope, previous presentation to [facility] in 5/11 for symptoms of chest pain and dizziness/vertigo and was discharged after her cardiac workup was negative with the diagnosis of possible labyrinthitis.  she had recurrent episodes of syncope.  She had a Lexiscan myoview in 5/11.  This showed breast attenuation artifact in the anterior region though they could not definitively exclude mild ischemia.    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  She had2 previous near syncopal episodes while performing aggressive physical therapy while in a standing position. Both episodes, she was hypotensive. First episode she had pressure of 90/50, second episode blood pressure was 79/50. IV fluid was started on a second episode.     She was seen in September by Dr. Rockey Situ with near-syncope. She had an event monitor done which showed normal sinus rhythm with PACs and no other significant arrhythmia. She was switched from amlodipine to lisinopril but that actually made her feel worse and she eventually went back on amlodipine. She presented to the hospital this time with dizziness but when I talked with her she denied that. She reports right upper quadrant pain. No syncope. She denies chest pain or dyspnea.   Physical Exam:   GEN no acute distress    HEENT red conjunctivae    NECK supple  No masses    RESP normal resp effort  clear BS    CARD Regular rate and rhythm  Bradycardic  No murmur    ABD denies tenderness    LYMPH negative neck    EXTR negative cyanosis/clubbing, negative edema    SKIN normal to palpation    NEURO cranial nerves intact    PSYCH alert, A+O to time, place, person   Review of Systems:   Subjective/Chief Complaint dizziness , abdominal pain    General: Fatigue  Weakness    Skin: No Complaints    ENT: No Complaints    Eyes: No Complaints    Neck: No Complaints    Respiratory:  No Complaints    Cardiovascular: No Complaints    Gastrointestinal: right upper quadrant pain    Genitourinary: No Complaints    Vascular: No Complaints    Musculoskeletal: No Complaints    Neurologic: Dizzness    Hematologic: No Complaints    Endocrine: No Complaints    Psychiatric: No Complaints     low back pain:    arthritis:    chronic diarrhea:    esophogeal cancer:    GERD:    htn:    colon CA:    breast CA:    right total knee: 11-Apr-2010   right breast lumpectomy:    colon resection:    esophogeal surgery for cancer:   Home Medications: Medication Instructions Status  Calcium 600+D 600 mg-200 intl units oral tablet 1 tab(s) orally 2 times a day Active  amlodipine 10 mg oral tablet 1 tab(s) orally once a day for high blood pressure Active  aspirin 81 mg oral tablet, chewable 1 tab(s) orally once a day Active  loperamide 2 mg oral tablet 2 tab(s) orally every 48 hours Active  potassium chloride 10 mEq oral tablet, extended release 1 tab(s) orally once a day for low potassium Active  clonazepam 1 mg oral tablet 1 tab(s) orally once a day (at bedtime) for anxiety (may take 0.5-1 tablet during the day for acute anxiety) Active  omeprazole 40 mg oral delayed release  capsule 1 cap(s) orally once a day for reflux Active  Tylenol Caplet Extra Strength 500 mg oral tablet 1 tab(s) orally every 6 hours, As Needed- for Pain  Active  ibuprofen 800 mg oral tablet 1 tab(s) orally 3 times a day, As Needed- for Pain  Active  Combigan 0.2%-0.5% ophthalmic solution 1 drop(s) to each eye 2 times a day Active  Lumigan 0.01% ophthalmic solution 1 drop(s) to each eye once a day (at bedtime) Active  Azopt 1% ophthalmic suspension 1 drop(s) to each eye 2 times a day Active   Lab Results: Hepatic:  04-Dec-13 17:59    Bilirubin, Total 0.4   Alkaline Phosphatase 111   SGPT (ALT) 18   SGOT (AST) 23   Total Protein, Serum 7.0   Albumin, Serum 3.6  Routine Chem:   04-Dec-13 17:59    Glucose, Serum 97   BUN 16   Creatinine (comp) 0.94   Sodium, Serum 143   Potassium, Serum  2.9   Chloride, Serum  108   CO2, Serum 26   Calcium (Total), Serum 8.6   Osmolality (calc) 286   eGFR (African American) >60   eGFR (Non-African American) >60 (eGFR values <71m/min/1.73 m2 may be an indication of chronic kidney disease (CKD). Calculated eGFR is useful in patients with stable renal function. The eGFR calculation will not be reliable in acutely ill patients when serum creatinine is changing rapidly. It is not useful in  patients on dialysis. The eGFR calculation may not be applicable to patients at the low and high extremes of body sizes, pregnant women, and vegetarians.)   Anion Gap 9  Cardiac:  04-Dec-13 17:59    CK, Total 153   CPK-MB, Serum 1.3 (Result(s) reported on 08 May 2012 at 07:02PM.)   Troponin I < 0.02 (0.00-0.05 0.05 ng/mL or less: NEGATIVE  Repeat testing in 3-6 hrs  if clinically indicated. >0.05 ng/mL: POTENTIAL  MYOCARDIAL INJURY. Repeat  testing in 3-6 hrs if  clinically indicated. NOTE: An increase or decrease  of 30% or more on serial  testing suggests a  clinically important change)  Routine UA:  04-Dec-13 17:59    Color (UA) Yellow   Clarity (UA) Clear   Glucose (UA) Negative   Bilirubin (UA) Negative   Ketones (UA) Negative   Specific Gravity (UA) 1.021   Blood (UA) 1+   pH (UA) 6.0   Protein (UA) Negative   Nitrite (UA) Negative   Leukocyte Esterase (UA) Trace (Result(s) reported on 08 May 2012 at 06:44PM.)   RBC (UA) 7 /HPF   WBC (UA) 3 /HPF   Bacteria (UA) NONE SEEN   Epithelial Cells (UA) <1 /HPF   Mucous (UA) PRESENT (Result(s) reported on 08 May 2012 at 06:44PM.)  Routine Hem:  04-Dec-13 17:59    WBC (CBC) 5.2   RBC (CBC) 4.21   Hemoglobin (CBC) 12.1   Hematocrit (CBC) 37.4   Platelet Count (CBC) 177 (Result(s) reported on 08 May 2012 at 06:36PM.)   MCV 89   MCH 28.6   MCHC 32.3   RDW 14.5    EKG:   EKG Interp. by me  sinus bradycardia   Radiology Results: CT:    05-Dec-13 02:03, CT Abdomen and Pelvis With Contrast   CT Abdomen and Pelvis With Contrast    REASON FOR EXAM:    (1) RLQ/RUQ pain and nausea history of colon cancer;   (2) RLQ/RUQ pain and nausea  COMMENTS:       PROCEDURE: CT  -  CT ABDOMEN / PELVIS  W  - May 09 2012  2:03AM     RESULT: Axial CT scanning was performed through the abdomen andpelvis   with reconstructions at 3 mm intervals and slice thicknesses. Review of   multiplanar reconstructed images was performed separately on the VIA   monitor. The patient received 100 cc of Isovue-370 intravenously and also   received oral contrast material.    The liver exhibits no focal mass or ductal dilation. There is a large   stable cyst in the right hepatic lobe. The gallbladder is adequately   distended and contains at least one large rim calcified gallstone. No     gallbladder wall thickening or pericholecystic fluid is demonstrated. The   pancreas exhibits no focal mass or inflammatory change. The stomach is   partially intrathoracic but does not appear obstructed. There is mild   stable enlargement of the adrenal glands. There are multiple   hypodensities within the kidneys consistent with cysts. These have been   previously demonstrated. There is no evidence of obstruction.    There is tortuosity of the descending thoracic aorta with mild failure to   taper, but there is no evidence of an aneurysm. The orally administered   contrast has traversed the small bowel and reached the remaining colon   which is predominantly left-sided. No obstructive process is   demonstrated. Within the pelvis the uterus is lobulated consistent with   the presence of fibroids. There is mild fullness in the right adnexal   region consistent with a cystic right ovarian mass. The partially   distended urinary bladder is grossly normal. There is no free fluid in     the  pelvis.    On delayed images contrast within the renal collecting systems appears   normal. The lung bases exhibit mild emphysematous changes and on the   right there is some fibrotic change. The lumbar vertebral bodies are   preserved in height.    IMPRESSION:   1. There is no evidence of bowel obstruction or ileus or acute   inflammation.  2. There is at least one large gallstone present. There is a stable cyst   measuring 5 cm in diameter in the right hepatic lobe.  3. There is stable bilateral adrenal gland enlargement There are stable   hypodensities in both kidneys most compatible with cysts. There is no   evidence of obstruction of the urinary tract.  4. There is tortuosity of the descending thoracic aorta the failure to   taper of its caliber but no evidence of an aneurysm.  5. Irregularity of the contour the uterus is consistent with fibroids.   Mild fullness in the right adnexal region is nonspecific. Followup   ultrasound of the pelvis may be useful.  6. The stomach is partially intrathoracic.    Apreliminary report was sent to the emergency department at the   conclusion of the study.     Dictation Site: 1        Verified By: DAVID A. Martinique, M.D., MD    Morphine: Other  Vital Signs/Nurse's Notes: **Vital Signs.:   05-Dec-13 17:19   Vital Signs Type Routine   Temperature Temperature (F) 98   Celsius 36.6   Temperature Source oral   Pulse Pulse 53   Respirations Respirations 18   Systolic BP Systolic BP 413   Diastolic BP (mmHg) Diastolic BP (mmHg) 82   Mean BP 94   Pulse Ox % Pulse Ox %  95   Pulse Ox Activity Level  At rest   Oxygen Delivery Room Air/ 21 %     Impression 1. Questionable dizziness. 2. Mild sinus bradycardia. 3. Right upper quadrant abdominal pain: Gallstones were noted previouslyon previous CT scan in 2009 and 2011.    Plan the patient denied dizziness but this was reported after discussing with her family members. She is mildly  bradycardic which might be contributing. However, her bradycardia is not severe enough to require a permanent pacemaker. Continue to monitor and will evaluate heart rate response to activity. The patient had a recent outpatient telemetry which showed no significant arrhythmia. Avoid medications that can cause bradycardia.   Electronic Signatures: Kathlyn Sacramento (MD)  (Signed 05-Dec-13 19:36)  Authored: General Aspect/Present Illness, History and Physical Exam, Review of System, Past Medical History, Home Medications, Labs, EKG , Radiology, Allergies, Vital Signs/Nurse's Notes, Impression/Plan   Last Updated: 05-Dec-13 19:36 by Kathlyn Sacramento (MD)

## 2014-09-22 NOTE — Consult Note (Signed)
PATIENT NAME:  [name], [name] MR#:  [id number] DATE OF BIRTH:  11/18/1940  DATE OF CONSULTATION:  05/27/2012  REFERRING PHYSICIAN:   CONSULTING PHYSICIAN:  Gladstone Lighter, MD  ADMITTING PHYSICIAN: Dia Crawford, MD  PRIMARY CARE PHYSICIAN: Denton Lank, MD   PRIMARY CARDIOLOGIST: Ida Rogue, MD   REASON FOR CONSULTATION: Acute respiratory distress.   BRIEF HISTORY: Ms. Cline is a 74 year old African American female with past medical history significant for hypertension, hyperlipidemia and colon cancer status post resection who had elective cholecystectomy done today for cholelithiasis and chronic nausea and right upper quadrant abdominal pain. She had general anesthesia with laryngeal mask airway. Post-surgery she was hypoxic and was weaned down to 3 liters oxygen. So she is being admitted under observation for respiratory distress after her surgery. She did respond to 5 mg of Lasix, had good urine output, chest x-ray shows only atelectasis, so she is weaned down to 2 liters now and is being admitted under observation. Medical consult is requested for the same.   PAST MEDICAL HISTORY: 1. Hypertension.  2. History of colon cancer status post resection.  3. Esophageal cancer status post resection.  4. Chronic diarrhea. 5. Hyperlipidemia.  6. Anxiety and depression.  7. Breast cancer status post lumpectomy and radiation.  PAST SURGICAL HISTORY:  1. Surgery for esophageal cancer.  2. Colon cancer resection.  3. Breast lump.  4. Partial hysterectomy.  5. Right knee total arthroplasty.  6. Laparoscopic cholecystectomy done today.  ALLERGIES: Intolerant to morphine.  HOME MEDICATIONS:  1. Calcium with vitamin D 600 mg/400 international units p.o. daily.  2. Amlodipine 10 mg p.o. daily.  3. Loperamide 2 mg orally every 2 hours as needed after diarrhea episode.  4. Potassium chloride 10 mEq p.o. daily.  5. Clonazepam 1 mg at bedtime once a day and 0.5 to 1 mg during daytime as  needed.  6. Prilosec 40 mg p.o. daily.  7. Tylenol Arthritis as needed.  8. Zofran 4 mg p.o. q. 6 hours p.r.n.  9. Combigan eyedrops one drop to both eyes twice a day.  10. Lumigan eyedrops at bedtime in both eyes.  11. Azopt eye drops, 1drop both eyes twice a day.  12. Tylenol with Codeine p.r.n. every 6 hours for pain.  13. Sertraline 100 mg p.o. daily.   SOCIAL HISTORY: Lives at home with her husband. No smoking or alcohol use.  FAMILY HISTORY: Significant for cancer, prostate and colon cancer, in the family.   REVIEW OF SYSTEMS: Difficult to be obtained as the patient is still recovering from her anesthesia after surgery.   PHYSICAL EXAMINATION:  VITAL SIGNS: Temperature 98.3 degrees Fahrenheit, pulse 75, respirations 16, blood pressure 128/71 and pulse oximetry 98% on 2 liters.   GENERAL: Well-built, well-nourished female lying in bed, not in any acute distress.   HEENT: Normocephalic, atraumatic. Pupils equal, round and reacting to light. Anicteric sclerae. Extraocular movements intact. Oropharynx clear without erythema, mass or exudates.   NECK: Supple. No thyromegaly, JVD or carotid bruits. No lymphadenopathy.   LUNGS: Moving air bilaterally. Decreased bibasilar breath sounds. No use of accessory muscles for breathing.   CARDIOVASCULAR: S1 and S2 regular rate and rhythm. No murmurs, rubs or gallops.   ABDOMEN: Soft, hypoactive bowel sounds. Just had lap cholecystectomy and has dressings in place from that.  EXTREMITIES: No pedal edema. No clubbing or cyanosis. 2+ dorsalis pedis pulses palpable bilaterally.   SKIN: No acne, rash or lesions.   NEUROLOGIC: Easily arousable. No cranial nerve deficits and  no focal motor or sensory deficits, following simple commands, although still groggy and confused.   PSYCHOLOGIC: Alert and confused from her anesthesia at this time.   LABORATORY DATA: WBC 9.2, hemoglobin 13.1, hematocrit 39.7 and platelet count 184.   Sodium 140,  potassium 3.3, chloride 106, bicarbonate 26, BUN 9, creatinine 0.84, glucose 119 and calcium 8.8.   RADIOLOGIC DATA: Chest x-ray done is showing low lung volumes with bilateral basilar opacities likely secondary to atelectasis or infection, although aspiration cannot be excluded.   RECOMMENDATIONS: A 74 year old female with hypertension, hyperlipidemia and cholelithiasis who had cholecystectomy done today and suffered postoperative respiratory distress. 1. Postoperative respiratory distress, likely secondary to atelectasis. She already had Lasix with good urine output. Chest x-ray does not show any pulmonary edema at this time. Continue incentive spirometry and DuoNebs p.r.n.  2. Hypokalemia with potassium chloride replacement.  3. Hypertension. Continue home medications.  4. Hyperlipidemia. Continue home medication.  5. Cholelithiasis status post laparoscopic cholecystectomy today. Further management per surgery. Likely discharge tomorrow morning.    CODE STATUS: FULL CODE.   TIME SPENT ON ADMISSION: 50 minutes. ____________________________ Gladstone Lighter, MD rk:sb D: 05/27/2012 17:20:01 ET T: 05/28/2012 07:19:53 ET JOB#: [id number]  cc: Gladstone Lighter, MD, <Dictator> Gladstone Lighter MD ELECTRONICALLY SIGNED 05/29/2012 10:58

## 2015-01-07 ENCOUNTER — Other Ambulatory Visit: Payer: Self-pay | Admitting: Family Medicine

## 2015-01-07 DIAGNOSIS — C50911 Malignant neoplasm of unspecified site of right female breast: Secondary | ICD-10-CM

## 2015-01-19 ENCOUNTER — Ambulatory Visit
Admission: RE | Admit: 2015-01-19 | Discharge: 2015-01-19 | Disposition: A | Discharge status: Home / Self Care | Payer: Medicare PPO | Source: Ambulatory Visit | Attending: Family Medicine | Admitting: Family Medicine

## 2015-01-19 ENCOUNTER — Ambulatory Visit: Payer: Medicare PPO

## 2015-01-19 ENCOUNTER — Other Ambulatory Visit: Payer: Medicare PPO

## 2015-01-19 DIAGNOSIS — Z853 Personal history of malignant neoplasm of breast: Secondary | ICD-10-CM | POA: Insufficient documentation

## 2015-01-19 DIAGNOSIS — C50911 Malignant neoplasm of unspecified site of right female breast: Secondary | ICD-10-CM

## 2015-01-19 HISTORY — DX: Malignant neoplasm of unspecified site of unspecified female breast: C50.919

## 2015-02-10 ENCOUNTER — Ambulatory Visit (INDEPENDENT_AMBULATORY_CARE_PROVIDER_SITE_OTHER): Payer: Medicare PPO | Admitting: Cardiovascular Disease

## 2015-02-10 ENCOUNTER — Encounter: Payer: Self-pay | Admitting: Cardiovascular Disease

## 2015-02-10 DIAGNOSIS — R55 Syncope and collapse: Secondary | ICD-10-CM

## 2015-02-10 DIAGNOSIS — I10 Essential (primary) hypertension: Secondary | ICD-10-CM | POA: Diagnosis not present

## 2015-02-10 DIAGNOSIS — E785 Hyperlipidemia, unspecified: Secondary | ICD-10-CM | POA: Diagnosis not present

## 2015-02-10 DIAGNOSIS — R079 Chest pain, unspecified: Secondary | ICD-10-CM

## 2015-02-10 DIAGNOSIS — R001 Bradycardia, unspecified: Secondary | ICD-10-CM

## 2015-02-10 NOTE — Patient Instructions (Signed)
You are doing well. No medication changes were made.  Take only 1/2 amlodipine daily  Please call us if you have new issues that need to be addressed before your next appt.  Your physician wants you to follow-up in: 6 months.  You will receive a reminder letter in the mail two months in advance. If you don't receive a letter, please call our office to schedule the follow-up appointment.

## 2015-02-10 NOTE — Assessment & Plan Note (Signed)
No further episodes of near syncope or syncope Encouraged fluid hydration, compression hose, low-dose amlodipine Also encouraged her to take Imodium given chronic diarrhea. She reports he is unable to afford previous medication provided by GI

## 2015-02-10 NOTE — Assessment & Plan Note (Signed)
Encouraged her to stay on her simvastatin 

## 2015-02-10 NOTE — Assessment & Plan Note (Signed)
Asymptomatic bradycardia. Currently not on beta blockers apart from eyedrops

## 2015-02-10 NOTE — Assessment & Plan Note (Signed)
Blood pressure is well controlled on today's visit. No changes made to the medications. Encouraged her to take amlodipine 5 mg daily given her history of vasovagal episodes, syncope

## 2015-02-10 NOTE — Assessment & Plan Note (Signed)
Previous episodes of chest pain, none recently. No further workup at this time

## 2015-02-10 NOTE — Progress Notes (Signed)
Patient ID: [name], female    DOB: [birth year], 74 y.o.   MRN: [id number]  HPI Comments: 51 -year-old woman with a history of syncope, who presented to [facility] in 5/11 for symptoms of chest pain and dizziness/vertigo and was discharged after her cardiac workup was negative with the diagnosis of possible labyrinthitis.  episode of neck and chest pain while at church after looking towards to neighbor, syncope while performing physical therapy in 2011 after total knee replacement, who presents for routine followup for her syncope On prior clinic visit, blood pressure medication was decreased, encouraged to wear compression hose. She has chronic diarrhea  In follow-up today, she continues to have chronic diarrhea, takes Imodium several times per day.  Symptoms started after GI surgery in 2004, worse after gallbladder surgery 2012 Rarely does she have nausea vomiting. Predominantly irritable bowel/dumping syndrome Husband passed away 2015/02/01 Denies any near syncope or syncope She's been taking amlodipine 5 mg daily Overall feels well with no complaints.  EKG on today's visit shows sinus bradycardia with nonspecific T wave abnormality  Other past medical history Previously at the eye doctor. She had acute abdominal pain, nausea. She requested to a family member to get her a glass of water. When they came back, she was on the ground, had vomited all over herself. Further evaluation showed heart rate in the 40s, low blood pressure. Sugars were 110. She lay on the ground, had some fluids, recovered and then went home.   Prior episode of near syncope or syncope happened  when she was a passenger in a car and she acutely had lightheadedness, near syncope. Previously amlodipine was held in the setting of orthostasis and syncope.   dramatic weight loss after her cholecystectomy. She has chronic diarrhea. Being managed by Dr. Ardis Hughs. She reports she has tried  cholestyramine before, Imodium is not working.   bradycardia during the colonoscopy procedure. Records indicate heart rates in the 40s and 50s. Blood pressure dropped after anesthesia  Previous Event monitor was performed that showed APCs with normal rhythm. Previous event monitor in early 2012 did not show any significant arrhythmia  Previous admission to the hospital for abdominal pain found to have gallbladder disease. She scheduled for gallbladder surgery in several days' time.    She had a Lexiscan myoview in 5/11.  This showed breast attenuation artifact in the anterior region though they could not definitively exclude mild ischemia.    total knee replacement on the right in November 2011. She has been participating in physical therapy. 2 near syncopal episodes while performing aggressive physical therapy while in a standing position. Both episodes, she was hypotensive. First episode she had pressure of 90/50, second episode blood pressure was 79/50. IV fluid was started on a second episode. The second episode was on June 30 2011   Allergies  Allergen Reactions  . Iohexol      Code: HIVES, Desc: PTS GRANDDAUGHTER [name] CALLED 12/31/08 TO NOTIFY us THAT HER GRANDMOTHER HAD A REACTION TO THE IV DYE 8 HRS POST CT EXAM PT DEVELOPED HIVES/RASH.12/31/08/RM, Onset Date: [id number]   . Morphine     Outpatient Encounter Prescriptions as of 02/10/2015  Medication Sig  . amLODipine (NORVASC) 10 MG tablet Take 10 mg by mouth daily.  Marland Kitchen aspirin 81 MG EC tablet Take 81 mg by mouth daily.   . Brimonidine Tartrate-Timolol (COMBIGAN OP) Place 1 drop into both eyes 2 (two) times daily.   . clonazePAM (KLONOPIN) 1 MG  tablet Takes 1 to 1 1/2 tablets daily as needed.  . Cyanocobalamin (VITAMIN B 12 PO) Take 1,000 mcg by mouth daily.  . dorzolamide (TRUSOPT) 2 % ophthalmic solution Place 1 drop into both eyes 2 (two) times daily.  Marland Kitchen latanoprost (XALATAN) 0.005 % ophthalmic solution Place 1 drop into both  eyes at bedtime.  . Loperamide HCl (ANTI-DIARRHEAL PO) Take by mouth. As needed   . omeprazole (PRILOSEC) 40 MG capsule Take 40 mg by mouth daily.  . sertraline (ZOLOFT) 100 MG tablet Take 100 mg by mouth daily.   . simvastatin (ZOCOR) 20 MG tablet Take 20 mg by mouth daily.  . timolol (BETIMOL) 0.5 % ophthalmic solution Place 1 drop into both eyes 2 (two) times daily.   No facility-administered encounter medications on file as of 02/10/2015.    Past Medical History  Diagnosis Date  . Unspecified ectopic pregnancy without intrauterine pregnancy   . Headache(784.0)   . Depressive disorder, not elsewhere classified   . Arthropathy, unspecified, site unspecified   . Anxiety state, unspecified   . Hypertension   . Diarrhea   . Benign carcinoid tumor of the duodenum   . Hyperlipidemia   . OA (osteoarthritis) of knee     right  . GERD (gastroesophageal reflux disease)   . Syncope and collapse   . Cancer     breast  . Colon cancer   . Esophageal cancer   . Kidney stones   . Disease of gallbladder   . Blindness of one eye   . Breast cancer 2009    Past Surgical History  Procedure Laterality Date  . Colon resection    . Breast surgery Right   . Colonoscopy    . Total knee arthroplasty Right   . Partial hysterectomy    . Cholecystectomy  05-27-12  . Breast biopsy Bilateral   . Breast excisional biopsy Right 2009    (+)    Social History  reports that she has never smoked. She has never used smokeless tobacco. She reports that she does not drink alcohol or use illicit drugs.  Family History family history includes Cancer in her mother; Diabetes in her daughter; Heart attack in her brother.       Review of Systems  Constitutional: Negative.   Respiratory: Negative.   Cardiovascular: Negative.   Gastrointestinal: Negative.   Musculoskeletal: Negative.   Neurological: Negative.   Hematological: Negative.   Psychiatric/Behavioral: Negative.   All other systems  reviewed and are negative.   BP 130/90 mmHg  Pulse 56  Ht 5\' 7"  (1.702 m)  Wt 165 lb (74.844 kg)  BMI 25.84 kg/m2  Physical Exam  Constitutional: She is oriented to person, place, and time. She appears well-developed and well-nourished.  HENT:  Head: Normocephalic.  Nose: Nose normal.  Mouth/Throat: Oropharynx is clear and moist.  Eyes: Conjunctivae are normal. Pupils are equal, round, and reactive to light.  Neck: Normal range of motion. Neck supple. No JVD present.  Cardiovascular: Normal rate, regular rhythm, S1 normal, S2 normal, normal heart sounds and intact distal pulses.  Exam reveals no gallop and no friction rub.   No murmur heard. Pulmonary/Chest: Effort normal and breath sounds normal. No respiratory distress. She has no wheezes. She has no rales. She exhibits no tenderness.  Abdominal: Soft. Bowel sounds are normal. She exhibits no distension. There is no tenderness.  Musculoskeletal: Normal range of motion. She exhibits no edema or tenderness.  Lymphadenopathy:    She has no cervical  adenopathy.  Neurological: She is alert and oriented to person, place, and time. Coordination normal.  Skin: Skin is warm and dry. No rash noted. No erythema.  Psychiatric: She has a normal mood and affect. Her behavior is normal. Judgment and thought content normal.    Assessment and Plan  Nursing note and vitals reviewed.

## 2015-04-22 ENCOUNTER — Encounter: Payer: Self-pay | Admitting: Gastroenterology

## 2015-09-13 ENCOUNTER — Ambulatory Visit (INDEPENDENT_AMBULATORY_CARE_PROVIDER_SITE_OTHER): Payer: Medicare Other | Admitting: Cardiovascular Disease

## 2015-09-13 ENCOUNTER — Encounter: Payer: Self-pay | Admitting: Cardiovascular Disease

## 2015-09-13 VITALS — BP 112/86 | HR 65 | Ht 67.0 in | Wt 167.8 lb

## 2015-09-13 DIAGNOSIS — K591 Functional diarrhea: Secondary | ICD-10-CM

## 2015-09-13 DIAGNOSIS — I10 Essential (primary) hypertension: Secondary | ICD-10-CM

## 2015-09-13 DIAGNOSIS — R001 Bradycardia, unspecified: Secondary | ICD-10-CM | POA: Diagnosis not present

## 2015-09-13 DIAGNOSIS — E785 Hyperlipidemia, unspecified: Secondary | ICD-10-CM

## 2015-09-13 NOTE — Assessment & Plan Note (Signed)
We spent much of her visit talking about her major medical issue which is her chronic diarrhea.  She reports having irritable bowel,  Worse since GI surgery, and again worse after gallbladder removed.  She takes Imodium frequently but this does not help controlled her symptoms.  Recommended she talk with primary care concerning possible other medication options

## 2015-09-13 NOTE — Progress Notes (Signed)
Patient ID: [name], female    DOB: 1940-12-05, 76 y.o.   MRN: [id number]  HPI Comments: 53 -year-old woman with a history of syncope, who presented to [facility] in 5/11 for symptoms of chest pain and dizziness/vertigo and was discharged after her cardiac workup was negative with the diagnosis of possible labyrinthitis, episode of neck and chest pain while at church after looking towards to neighbor, syncope while performing physical therapy in 09-08-09 after total knee replacement, who presents for routine followup for her syncope On prior clinic visit, blood pressure medication was decreased, encouraged to wear compression hose.  She has chronic diarrhea, started after GI surgery in 09-Sep-2002, worse after gallbladder surgery 2010-09-09 Husband passed away 01-09-2015  Rarely does she have nausea vomiting. Predominantly irritable bowel/dumping syndrome Denies any near syncope or syncope On past visits, she was taking one half of her amlodipine She does periodically have dizziness episodes, not frequent Periodically has headaches which she attributes to her eye medication Overall feels well with no complaints.  EKG on today's visit shows normal sinus rhythm with rate 64 bpm, T-wave abnormality anterolateral leads  Other past medical history Previously at the eye doctor. She had acute abdominal pain, nausea. She requested to a family member to get her a glass of water. When they came back, she was on the ground, had vomited all over herself. Further evaluation showed heart rate in the 40s, low blood pressure. Sugars were 110. She lay on the ground, had some fluids, recovered and then went home.   Prior episode of near syncope or syncope happened  when she was a passenger in a car and she acutely had lightheadedness, near syncope. Previously amlodipine was held in the setting of orthostasis and syncope.   dramatic weight loss after her cholecystectomy. She has chronic diarrhea.  Being managed by Dr. Ardis Hughs. She reports she has tried cholestyramine before, Imodium is not working.   bradycardia during the colonoscopy procedure. Records indicate heart rates in the 40s and 50s. Blood pressure dropped after anesthesia  Previous Event monitor was performed that showed APCs with normal rhythm. Previous event monitor in early 09-Sep-2010 did not show any significant arrhythmia  Previous admission to the hospital for abdominal pain found to have gallbladder disease. She scheduled for gallbladder surgery in several days' time.    She had a Lexiscan myoview in 5/11.  This showed breast attenuation artifact in the anterior region though they could not definitively exclude mild ischemia.    total knee replacement on the right in November 2011. She has been participating in physical therapy. 2 near syncopal episodes while performing aggressive physical therapy while in a standing position. Both episodes, she was hypotensive. First episode she had pressure of 90/50, second episode blood pressure was 79/50. IV fluid was started on a second episode. The second episode was on June 30 2011   Allergies  Allergen Reactions  . Iohexol      Code: HIVES, Desc: PTS GRANDDAUGHTER [name] CALLED 12/31/08 TO NOTIFY us THAT HER GRANDMOTHER HAD A REACTION TO THE IV DYE 8 HRS POST CT EXAM PT DEVELOPED HIVES/RASH.12/31/08/RM, Onset Date: [id number]   . Morphine     Outpatient Encounter Prescriptions as of 09/13/2015  Medication Sig  . amLODipine (NORVASC) 10 MG tablet Take 10 mg by mouth daily.  Marland Kitchen aspirin 81 MG EC tablet Take 81 mg by mouth daily.   . Brimonidine Tartrate-Timolol (COMBIGAN OP) Place 1 drop into both eyes 2 (two)  times daily.   . clonazePAM (KLONOPIN) 1 MG tablet Takes 1 to 1 1/2 tablets daily as needed.  . Cyanocobalamin (VITAMIN B 12 PO) Take 1,000 mcg by mouth daily.  . dorzolamide (TRUSOPT) 2 % ophthalmic solution Place 1 drop into both eyes 2 (two) times daily.  Marland Kitchen latanoprost  (XALATAN) 0.005 % ophthalmic solution Place 1 drop into both eyes at bedtime.  . Loperamide HCl (ANTI-DIARRHEAL PO) Take by mouth. As needed   . omeprazole (PRILOSEC) 40 MG capsule Take 40 mg by mouth daily.  . sertraline (ZOLOFT) 100 MG tablet Take 100 mg by mouth daily.   . simvastatin (ZOCOR) 20 MG tablet Take 20 mg by mouth daily.  . timolol (BETIMOL) 0.5 % ophthalmic solution Place 1 drop into both eyes 2 (two) times daily.   No facility-administered encounter medications on file as of 09/13/2015.    Past Medical History  Diagnosis Date  . Unspecified ectopic pregnancy without intrauterine pregnancy   . Headache(784.0)   . Depressive disorder, not elsewhere classified   . Arthropathy, unspecified, site unspecified   . Anxiety state, unspecified   . Hypertension   . Diarrhea   . Benign carcinoid tumor of the duodenum   . Hyperlipidemia   . OA (osteoarthritis) of knee     right  . GERD (gastroesophageal reflux disease)   . Syncope and collapse   . Cancer [facility])     breast  . Colon cancer ([place])   . Esophageal cancer ([place])   . Kidney stones   . Disease of gallbladder   . Blindness of one eye   . Breast cancer ([place]) 2009    Past Surgical History  Procedure Laterality Date  . Colon resection    . Breast surgery Right   . Colonoscopy    . Total knee arthroplasty Right   . Partial hysterectomy    . Cholecystectomy  05-27-12  . Breast biopsy Bilateral   . Breast excisional biopsy Right 2009    (+)    Social History  reports that she has never smoked. She has never used smokeless tobacco. She reports that she does not drink alcohol or use illicit drugs.  Family History family history includes Cancer in her mother; Diabetes in her daughter; Heart attack in her brother.       Review of Systems  Constitutional: Negative.   Respiratory: Negative.   Cardiovascular: Negative.   Gastrointestinal: Negative.   Musculoskeletal: Negative.   Neurological: Negative.    Hematological: Negative.   Psychiatric/Behavioral: Negative.   All other systems reviewed and are negative.   BP 112/86 mmHg  Pulse 65  Ht 5\' 7"  (1.702 m)  Wt 167 lb 12.8 oz (76.114 kg)  BMI 26.28 kg/m2  SpO2 96%  Physical Exam  Constitutional: She is oriented to person, place, and time. She appears well-developed and well-nourished.  HENT:  Head: Normocephalic.  Nose: Nose normal.  Mouth/Throat: Oropharynx is clear and moist.  Eyes: Conjunctivae are normal. Pupils are equal, round, and reactive to light.  Neck: Normal range of motion. Neck supple. No JVD present.  Cardiovascular: Normal rate, regular rhythm, S1 normal, S2 normal, normal heart sounds and intact distal pulses.  Exam reveals no gallop and no friction rub.   No murmur heard. Pulmonary/Chest: Effort normal and breath sounds normal. No respiratory distress. She has no wheezes. She has no rales. She exhibits no tenderness.  Abdominal: Soft. Bowel sounds are normal. She exhibits no distension. There is no tenderness.  Musculoskeletal: Normal  range of motion. She exhibits no edema or tenderness.  Lymphadenopathy:    She has no cervical adenopathy.  Neurological: She is alert and oriented to person, place, and time. Coordination normal.  Skin: Skin is warm and dry. No rash noted. No erythema.  Psychiatric: She has a normal mood and affect. Her behavior is normal. Judgment and thought content normal.    Assessment and Plan  Nursing note and vitals reviewed.

## 2015-09-13 NOTE — Patient Instructions (Signed)
You are doing well. No medication changes were made.  If your dizziness gets worse, Cut the amlodipine in 1/2   Please call us if you have new issues that need to be addressed before your next appt.  Your physician wants you to follow-up in: 12 months.  You will receive a reminder letter in the mail two months in advance. If you don't receive a letter, please call our office to schedule the follow-up appointment.

## 2015-09-13 NOTE — Assessment & Plan Note (Signed)
No further episodes of near syncope or syncope  Previous episodes in the setting of GI distress, irritable bowel /diarrhea,  Possibly vasovagal

## 2015-09-13 NOTE — Assessment & Plan Note (Signed)
Encouraged her to stay on her simvastatin  we will request most recent lab work

## 2016-01-04 ENCOUNTER — Other Ambulatory Visit: Payer: Self-pay | Admitting: Family Medicine

## 2016-01-04 DIAGNOSIS — R51 Headache: Principal | ICD-10-CM

## 2016-01-04 DIAGNOSIS — R519 Headache, unspecified: Secondary | ICD-10-CM

## 2016-01-21 ENCOUNTER — Ambulatory Visit: Payer: Medicare Other

## 2016-01-22 ENCOUNTER — Ambulatory Visit
Admission: RE | Admit: 2016-01-22 | Discharge: 2016-01-22 | Disposition: A | Discharge status: Home / Self Care | Payer: Medicare Other | Source: Ambulatory Visit | Attending: Family Medicine | Admitting: Family Medicine

## 2016-01-22 DIAGNOSIS — G319 Degenerative disease of nervous system, unspecified: Secondary | ICD-10-CM | POA: Insufficient documentation

## 2016-01-22 DIAGNOSIS — R51 Headache: Secondary | ICD-10-CM | POA: Diagnosis present

## 2016-01-22 DIAGNOSIS — I6782 Cerebral ischemia: Secondary | ICD-10-CM | POA: Diagnosis not present

## 2016-01-22 DIAGNOSIS — I618 Other nontraumatic intracerebral hemorrhage: Secondary | ICD-10-CM | POA: Insufficient documentation

## 2016-01-22 DIAGNOSIS — R413 Other amnesia: Secondary | ICD-10-CM | POA: Insufficient documentation

## 2016-01-22 DIAGNOSIS — R519 Headache, unspecified: Secondary | ICD-10-CM

## 2016-02-09 ENCOUNTER — Encounter: Payer: Self-pay | Admitting: Neurology

## 2016-02-09 ENCOUNTER — Ambulatory Visit (INDEPENDENT_AMBULATORY_CARE_PROVIDER_SITE_OTHER): Payer: Medicare Other | Admitting: Neurology

## 2016-02-09 VITALS — BP 140/78 | HR 68 | Ht 67.0 in | Wt 170.0 lb

## 2016-02-09 DIAGNOSIS — I619 Nontraumatic intracerebral hemorrhage, unspecified: Secondary | ICD-10-CM

## 2016-02-09 DIAGNOSIS — R51 Headache: Secondary | ICD-10-CM

## 2016-02-09 DIAGNOSIS — G8929 Other chronic pain: Secondary | ICD-10-CM

## 2016-02-09 DIAGNOSIS — G4710 Hypersomnia, unspecified: Secondary | ICD-10-CM | POA: Diagnosis not present

## 2016-02-09 DIAGNOSIS — R400 Somnolence: Secondary | ICD-10-CM

## 2016-02-09 DIAGNOSIS — R0683 Snoring: Secondary | ICD-10-CM | POA: Diagnosis not present

## 2016-02-09 DIAGNOSIS — R413 Other amnesia: Secondary | ICD-10-CM

## 2016-02-09 MED ORDER — DONEPEZIL HCL 10 MG PO TABS: 10.0000 mg | ORAL_TABLET | Freq: Every day | ORAL | 12 refills | Status: DC

## 2016-02-09 NOTE — Progress Notes (Signed)
[facility]    Provider:  Dr Jaynee Eagles Referring Provider: Denton Lank, MD Primary Care Physician:  Baltazar Apo, MD  CC:  Memory loss  HPI:  [name] is a 75 y.o. female here as a referral from Dr. Posey Pronto for memory loss. Past medical history of hypertension, high cholesterol, migraine, depression, anxiety, breast cancer and colon cancer. Here with daughter and granddaughter who provide most information. She loses things. She repeats herself. Started 2 years ago and worsening. She had a brother in his 16s maybe with memory loss but also had a stroke, no FHx of Alzheimer's dementia. More short-term memory loss. She forgets things and later remembers it like names. She forgets names and faces. She has forgotten things that they discussed multiple times over the years and patient does not remember. A lot of little things. She lives alone. She does not drive. Patient pays the bills but needs help now. She needs help getting her medications. She does not want to eat. Her husband just dies a year ago and they were married 50 years ago. She has had migraine headaches for years and she has had chronic headaches for years since daughter remembers and now they feel like daily pressure hedaches. Granddaughter thinks she is depressed. Says that patient will repeat things even if she just told them earlier. She hides things and can;t find them. Headaches are in the morning when she wakes up every day. She snores badly per family. Headaches are daily she wakes with them every morning.   Reviewed MRi brain images personally and with patient and family: Mild atrophy and chronic microvascular ischemic change. Tiny RIGHT thalamic focus chronic hemorrhage, likely sequelae of hypertensive cerebrovascular disease.  No acute intracranial findings are evident.   Reviewed notes, labs and imaging from outside physicians, which showed:   Labs drawn 12/30/2015: CMP was unremarkable with  creatinine 0.91, B12 was 1037, folate 13.2.    Her primary care notes, she has been depressed, flat affect sometimes tearful, doesn't want to talk to counselor may be in the future. Patient has headache on and off. She suspected tension headache. Tried stretching increasing fluids for hydration, may take Tylenol, MRI was ordered.  MRI of the brain (personally reviewed images 01/22/2016) and agree with the following: Mild atrophy and chronic microvascular ischemic change. Tiny RIGHT thalamic focus chronic hemorrhage, likely sequelae of hypertensive cerebrovascular disease.  No acute intracranial findings are evident. Review of Systems: Patient complains of symptoms per HPI as well as the following symptoms: Weight loss, blurred vision, double vision, palpitations, swelling in legs, shortness of breath, diarrhea, memory loss, headache, weakness, dizziness, insomnia, sleepiness, restless legs, depression, anxiety, decreased energy, change in appetite. Pertinent negatives per HPI. All others negative.   Social History   Social History  . Marital status: Widowed    Spouse name: N/A  . Number of children: N/A  . Years of education: N/A   Occupational History  . Retired    Social History Main Topics  . Smoking status: Never Smoker  . Smokeless tobacco: Never Used  . Alcohol use No  . Drug use: No  . Sexual activity: Not on file   Other Topics Concern  . Not on file   Social History Narrative   Lives alone   Caffeine use: coffee/soda    Family History  Problem Relation Age of Onset  . Cancer Mother     unknown type  . Heart attack Brother   . Diabetes Daughter  Past Medical History:  Diagnosis Date  . Anxiety state, unspecified   . Arthropathy, unspecified, site unspecified   . Benign carcinoid tumor of the duodenum   . Blindness of one eye   . Breast cancer ([place]) 2009  . Cancer [facility])    breast  . Colon cancer ([place])   . Depressive disorder, not elsewhere  classified   . Diarrhea   . Disease of gallbladder   . Esophageal cancer ([place])   . GERD (gastroesophageal reflux disease)   . Headache(784.0)   . Hyperlipidemia   . Hypertension   . Kidney stones   . OA (osteoarthritis) of knee    right  . Syncope and collapse   . Unspecified ectopic pregnancy without intrauterine pregnancy     Past Surgical History:  Procedure Laterality Date  . BREAST BIOPSY Bilateral   . BREAST EXCISIONAL BIOPSY Right 2009   (+)  . BREAST SURGERY Right   . CHOLECYSTECTOMY  05-27-12  . COLON RESECTION    . COLONOSCOPY    . PARTIAL HYSTERECTOMY    . TOTAL KNEE ARTHROPLASTY Right     Current Outpatient Prescriptions  Medication Sig Dispense Refill  . amLODipine (NORVASC) 10 MG tablet Take 10 mg by mouth daily.    Marland Kitchen aspirin 81 MG EC tablet Take 81 mg by mouth daily.     . Brimonidine Tartrate-Timolol (COMBIGAN OP) Place 1 drop into both eyes 2 (two) times daily.     . clonazePAM (KLONOPIN) 1 MG tablet Takes 1 to 1 1/2 tablets daily as needed.    . Cyanocobalamin (VITAMIN B 12 PO) Take 1,000 mcg by mouth daily.    . dorzolamide (TRUSOPT) 2 % ophthalmic solution Place 1 drop into both eyes 2 (two) times daily.    Marland Kitchen ibuprofen (ADVIL,MOTRIN) 200 MG tablet Take 200 mg by mouth every 6 (six) hours as needed.    . latanoprost (XALATAN) 0.005 % ophthalmic solution Place 1 drop into both eyes at bedtime.    . Loperamide HCl (ANTI-DIARRHEAL PO) Take by mouth. As needed     . omeprazole (PRILOSEC) 40 MG capsule Take 40 mg by mouth daily.    . sertraline (ZOLOFT) 100 MG tablet Take 100 mg by mouth daily.     . simvastatin (ZOCOR) 20 MG tablet Take 20 mg by mouth daily.    . timolol (BETIMOL) 0.5 % ophthalmic solution Place 1 drop into both eyes 2 (two) times daily.    Marland Kitchen donepezil (ARICEPT) 10 MG tablet Take 1 tablet (10 mg total) by mouth at bedtime. 30 tablet 12   No current facility-administered medications for this visit.     Allergies as of 02/09/2016 - Review  Complete 09/13/2015  Allergen Reaction Noted  . Iohexol  12/31/2008  . Morphine      Vitals: BP 140/78 (BP Location: Right Arm, Patient Position: Sitting, Cuff Size: Normal)   Pulse 68   Ht 5\' 7"  (1.702 m)   Wt 170 lb (77.1 kg)   BMI 26.63 kg/m  Last Weight:  Wt Readings from Last 1 Encounters:  02/09/16 170 lb (77.1 kg)   Last Height:   Ht Readings from Last 1 Encounters:  02/09/16 5\' 7"  (1.702 m)   Physical exam: Exam: Gen: NAD            CV: RRR, no MRG. No Carotid Bruits. No peripheral edema, warm, nontender Eyes: Conjunctivae clear without exudates or hemorrhage  Neuro: Detailed Neurologic Exam  Speech:    Speech is  normal; fluent and spontaneous with normal comprehension.  Cognition: MMSE - Mini Mental State Exam 02/09/2016  Orientation to time 4  Orientation to Place 4  Registration 3  Attention/ Calculation 0  Recall 2  Language- name 2 objects 2  Language- repeat 1  Language- follow 3 step command 2  Language- read & follow direction 1  Write a sentence 1  Copy design 0  Total score 20      The patient is oriented to person, place, and time;     recent and remote memory impaired;     language fluent;     Impaired attention, concentration,     fund of knowledge impaired  Cranial Nerves:    The pupils are equal, round, and reactive to light. Attempted funduscopic exam could not visualize due to small pupils. Visual fields are full to finger confrontation. Extraocular movements are intact. Trigeminal sensation is intact and the muscles of mastication are normal. The face is symmetric. The palate elevates in the midline. Hearing intact. Voice is normal. Shoulder shrug is normal. The tongue has normal motion without fasciculations.   Coordination:  No dysmetria  Gait:    Not ataxic, normal stride  Motor Observation:    No asymmetry, no atrophy, and no involuntary movements noted. Tone:    Normal muscle tone.    Posture:    Posture is normal. normal  erect    Strength:    Strength is V/V in the upper and lower limbs.      Sensation: intact to LT     Reflex Exam:  DTR's:    Deep tendon reflexes in the upper and lower extremities are brisk for age bilaterally.   Toes:    The toes are downgoing bilaterally.   Clonus:    Clonus is absent.     Assessment/Plan:  75 year old patient with memory loss, chronic headaches, thalamic hemorrhage.  Tiny RIGHT thalamic focus chronic hemorrhage, likely sequelae of hypertensive cerebrovascular disease. Monitor blood pressure  Snoring, memory loss, daytime somnolence, morning headache: sleep eval  As far as your medications are concerned, I would like to suggest: Aricept. Take 1/2 a pill for 2 weeks then increase to a whole pill. Sleep evaluation for sleep apnea.   I would like to see you back in 3 months, sooner if we need to. Please call us with any interim questions, concerns, problems, updates or refill requests.   Our phone number is [phone]. We also have an after hours call service for urgent matters and there is a physician on-call for urgent questions. For any emergencies you know to call 911 or go to the nearest emergency room  Chronic headaches: can address more at next appointment. Discussed the following:  To prevent or relieve headaches, try the following: Cool Compress. Lie down and place a cool compress on your head.  Avoid headache triggers. If certain foods or odors seem to have triggered your migraines in the past, avoid them. A headache diary might help you identify triggers.  Include physical activity in your daily routine. Try a daily walk or other moderate aerobic exercise.  Manage stress. Find healthy ways to cope with the stressors, such as delegating tasks on your to-do list.  Practice relaxation techniques. Try deep breathing, yoga, massage and visualization.  Eat regularly. Eating regularly scheduled meals and maintaining a healthy diet might help prevent  headaches. Also, drink plenty of fluids.  Follow a regular sleep schedule. Sleep deprivation might contribute to headaches Consider biofeedback. With  this mind-body technique, you learn to control certain bodily functions - such as muscle tension, heart rate and blood pressure - to prevent headaches or reduce headache pain.    Proceed to emergency room if you experience new or worsening symptoms or symptoms do not resolve, if you have new neurologic symptoms or if headache is severe, or for any concerning symptom.    Sarina Ill, MD  [facility] [address] [place], [place] [address]  Phone [phone] Fax [phone]

## 2016-02-09 NOTE — Patient Instructions (Addendum)
Remember to drink plenty of fluid, eat healthy meals and do not skip any meals. Try to eat protein with a every meal and eat a healthy snack such as fruit or nuts in between meals. Try to keep a regular sleep-wake schedule and try to exercise daily, particularly in the form of walking, 20-30 minutes a day, if you can.   As far as your medications are concerned, I would like to suggest: Aricept. Take 1/2 a pill for 2 weeks then increase to a whole pill. Sleep evaluation for sleep apnea.   I would like to see you back in 3 months, sooner if we need to. Please call us with any interim questions, concerns, problems, updates or refill requests.   Our phone number is [phone]. We also have an after hours call service for urgent matters and there is a physician on-call for urgent questions. For any emergencies you know to call 911 or go to the nearest emergency room  Donepezil tablets What is this medicine? DONEPEZIL (doe NEP e zil) is used to treat mild to moderate dementia caused by Alzheimer's disease. This medicine may be used for other purposes; ask your health care provider or pharmacist if you have questions. What should I tell my health care provider before I take this medicine? They need to know if you have any of these conditions: -asthma or other lung disease -difficulty passing urine -head injury -heart disease -history of irregular heartbeat -liver disease -seizures (convulsions) -stomach or intestinal disease, ulcers or stomach bleeding -an unusual or allergic reaction to donepezil, other medicines, foods, dyes, or preservatives -pregnant or trying to get pregnant -breast-feeding How should I use this medicine? Take this medicine by mouth with a glass of water. Follow the directions on the prescription label. You may take this medicine with or without food. Take this medicine at regular intervals. This medicine is usually taken before bedtime. Do not take it more often than  directed. Continue to take your medicine even if you feel better. Do not stop taking except on your doctor's advice. If you are taking the 23 mg donepezil tablet, swallow it whole; do not cut, crush, or chew it. Talk to your pediatrician regarding the use of this medicine in children. Special care may be needed. Overdosage: If you think you have taken too much of this medicine contact a poison control center or emergency room at once. NOTE: This medicine is only for you. Do not share this medicine with others. What if I miss a dose? If you miss a dose, take it as soon as you can. If it is almost time for your next dose, take only that dose, do not take double or extra doses. What may interact with this medicine? Do not take this medicine with any of the following medications: -certain medicines for fungal infections like itraconazole, fluconazole, posaconazole, and voriconazole -cisapride -dextromethorphan; quinidine -dofetilide -dronedarone -pimozide -quinidine -thioridazine -ziprasidone This medicine may also interact with the following medications: -antihistamines for allergy, cough and cold -atropine -bethanechol -carbamazepine -certain medicines for bladder problems like oxybutynin, tolterodine -certain medicines for Parkinson's disease like benztropine, trihexyphenidyl -certain medicines for stomach problems like dicyclomine, hyoscyamine -certain medicines for travel sickness like scopolamine -dexamethasone -ipratropium -NSAIDs, medicines for pain and inflammation, like ibuprofen or naproxen -other medicines for Alzheimer's disease -other medicines that prolong the QT interval (cause an abnormal heart rhythm) -phenobarbital -phenytoin -rifampin, rifabutin or rifapentine This list may not describe all possible interactions. Give your health care provider a list  of all the medicines, herbs, non-prescription drugs, or dietary supplements you use. Also tell them if you smoke,  drink alcohol, or use illegal drugs. Some items may interact with your medicine. What should I watch for while using this medicine? Visit your doctor or health care professional for regular checks on your progress. Check with your doctor or health care professional if your symptoms do not get better or if they get worse. You may get drowsy or dizzy. Do not drive, use machinery, or do anything that needs mental alertness until you know how this drug affects you. What side effects may I notice from receiving this medicine? Side effects that you should report to your doctor or health care professional as soon as possible: -allergic reactions like skin rash, itching or hives, swelling of the face, lips, or tongue -changes in vision -feeling faint or lightheaded, falls -problems with balance -redness, blistering, peeling or loosening of the skin, including inside the mouth -slow heartbeat, or palpitations -stomach pain -unusual bleeding or bruising, red or purple spots on the skin -vomiting -weight loss Side effects that usually do not require medical attention (report to your doctor or health care professional if they continue or are bothersome): -diarrhea, especially when starting treatment -headache -indigestion or heartburn -loss of appetite -muscle cramps -nausea This list may not describe all possible side effects. Call your doctor for medical advice about side effects. You may report side effects to FDA at 1-800-FDA-1088. Where should I keep my medicine? Keep out of reach of children. Store at room temperature between 15 and 30 degrees C (59 and 86 degrees F). Throw away any unused medicine after the expiration date. NOTE: This sheet is a summary. It may not cover all possible information. If you have questions about this medicine, talk to your doctor, pharmacist, or health care provider.    2016, Elsevier/Gold Standard. (2014-01-01 07:51:52)

## 2016-02-13 ENCOUNTER — Encounter: Payer: Self-pay | Admitting: Neurology

## 2016-02-13 DIAGNOSIS — G8929 Other chronic pain: Secondary | ICD-10-CM | POA: Insufficient documentation

## 2016-02-13 DIAGNOSIS — R51 Headache: Secondary | ICD-10-CM

## 2016-02-13 DIAGNOSIS — R519 Headache, unspecified: Secondary | ICD-10-CM | POA: Insufficient documentation

## 2016-02-13 DIAGNOSIS — R0683 Snoring: Secondary | ICD-10-CM | POA: Insufficient documentation

## 2016-02-13 DIAGNOSIS — R400 Somnolence: Secondary | ICD-10-CM | POA: Insufficient documentation

## 2016-02-13 DIAGNOSIS — R413 Other amnesia: Secondary | ICD-10-CM | POA: Insufficient documentation

## 2016-02-29 ENCOUNTER — Encounter: Payer: Self-pay | Admitting: Neurology

## 2016-02-29 ENCOUNTER — Ambulatory Visit (INDEPENDENT_AMBULATORY_CARE_PROVIDER_SITE_OTHER): Payer: Medicare Other | Admitting: Neurology

## 2016-02-29 VITALS — BP 110/68 | HR 80 | Resp 20 | Ht 67.0 in | Wt 169.0 lb

## 2016-02-29 DIAGNOSIS — S069X1S Unspecified intracranial injury with loss of consciousness of 30 minutes or less, sequela: Secondary | ICD-10-CM

## 2016-02-29 DIAGNOSIS — S069XAA Unspecified intracranial injury with loss of consciousness status unknown, initial encounter: Secondary | ICD-10-CM | POA: Insufficient documentation

## 2016-02-29 DIAGNOSIS — R0683 Snoring: Secondary | ICD-10-CM

## 2016-02-29 DIAGNOSIS — G4751 Confusional arousals: Secondary | ICD-10-CM | POA: Diagnosis not present

## 2016-02-29 DIAGNOSIS — G4720 Circadian rhythm sleep disorder, unspecified type: Secondary | ICD-10-CM | POA: Insufficient documentation

## 2016-02-29 DIAGNOSIS — S069X9A Unspecified intracranial injury with loss of consciousness of unspecified duration, initial encounter: Secondary | ICD-10-CM | POA: Insufficient documentation

## 2016-02-29 NOTE — Progress Notes (Signed)
[facility]   Provider:  Larey Seat, M D  Referring Provider: Denton Lank, MD Primary Care Physician:  Baltazar Apo, MD  Chief Complaint  Patient presents with  . New Patient (Initial Visit)    sleep, snores    HPI:  [name] is a 75 y.o. female , seen here as a referral/ revisit  from Dr. Jaynee Robertson, whom she has seen for memory loss.  [name] is a widow who lives alone but has daily visits from her daughter and grand- daughters. 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 She reports that when she takes her green drops for glaucoma at nighttime to help her to go to sleep and she wakes up with a much less intense headaches and she can have other days. Most headaches respond to ibuprofen by mouth. She reports that she is normally not woken by a headache but that the headache may be present at the time she wakes up anyway. She no longer is driving , but does many things in her household alone.  She sees friends from church. She had been for 30 years in [place]  As her husband struggled with alcoholism, and she is reluctant to join another 12 step program, spent a lot of time and tears in church basements.    Sleep habits are as follows: The patient is late to go to bed, her sleep periods have more shifted towards daytime while at night she seems to stay awake for long periods of time. She sleeps in the living room on the couch , she had not used the bedroom where her husband died.  Her grand-daughter fees that she watches TV - she can not read regular books any longer, she is sleeping on and off.  She sleeps in daytime for many many naps,  She witnessed her grandmother falling asleep , while on the phone, while having a cup in her hands, and she has no set rise time , either. Her granddaughter feels she is not getting up before noon. She sleeps on 2 pillows, and has the TV on all night. She feels lonely.  She sleeps best when a family member is with her.   She endorsed the Epworth score at 17 points.   Social history:  Widowed, here seen with her grand-daughter. Non drinker, non smoker,  Drinks caffeine in moderation. She is unwilling to join any activity groups.      Note by Dr [name], Referring Provider: Denton Lank, MD  CC:  Memory loss  HPI:  [name] is a 75 y.o. female here as a referral from Baltazar Apo, MD , for memory loss. Past medical history of hypertension, high cholesterol, migraine, depression, anxiety, breast cancer and colon cancer. Here with daughter and granddaughter who provide most information. She loses things. She repeats herself. Started 2 years ago and worsening. She had a brother in his 3s maybe with memory loss but also had a stroke, no FHx of Alzheimer's dementia. More short-term memory loss. She forgets things and later remembers it like names. She forgets names and faces. She has forgotten things that they discussed multiple times over the years and patient does not remember. A lot of little things. She lives alone. She does not drive. Patient pays the bills but needs help now. She needs help getting her medications. She does not want to eat. Her husband just dies a  year ago and they were married 50 years ago. She has had migraine headaches for years and she has had chronic headaches for years since daughter remembers and now they feel like daily pressure hedaches. Granddaughter thinks she is depressed. Says that patient will repeat things even if she just told them earlier. She hides things and can;t find them. Headaches are in the morning when she wakes up every day. She snores badly per family. Headaches are daily she wakes with them every morning.   Reviewed MRi brain images personally and with patient and family: Mild atrophy and chronic microvascular ischemic change. Tiny RIGHT thalamic focus chronic hemorrhage, likely sequelae of hypertensive  cerebrovascular disease.  No acute intracranial findings are evident.  Review of Systems: Out of a complete 14 system review, the patient complains of only the following symptoms, and all other reviewed systems are negative. Memory loss.  She snores, witnessed apnea per grand-daughter.   Epworth score 17 ,   Social History   Social History  . Marital status: Widowed    Spouse name: N/A  . Number of children: N/A  . Years of education: N/A   Occupational History  . Retired    Social History Main Topics  . Smoking status: Never Smoker  . Smokeless tobacco: Never Used  . Alcohol use No  . Drug use: No  . Sexual activity: Not on file   Other Topics Concern  . Not on file   Social History Narrative   Lives alone   Caffeine use: coffee/soda    Family History  Problem Relation Age of Onset  . Cancer Mother     unknown type  . Heart attack Brother   . Diabetes Daughter     Past Medical History:  Diagnosis Date  . Anxiety state, unspecified   . Arthropathy, unspecified, site unspecified   . Benign carcinoid tumor of the duodenum   . Blindness of one eye   . Breast cancer ([place]) 2009  . Cancer [facility])    breast  . Colon cancer ([place])   . Depressive disorder, not elsewhere classified   . Diarrhea   . Disease of gallbladder   . Esophageal cancer ([place])   . GERD (gastroesophageal reflux disease)   . Headache(784.0)   . Hyperlipidemia   . Hypertension   . Kidney stones   . OA (osteoarthritis) of knee    right  . Syncope and collapse   . Unspecified ectopic pregnancy without intrauterine pregnancy     Past Surgical History:  Procedure Laterality Date  . BREAST BIOPSY Bilateral   . BREAST EXCISIONAL BIOPSY Right 2009   (+)  . BREAST SURGERY Right   . CHOLECYSTECTOMY  05-27-12  . COLON RESECTION    . COLONOSCOPY    . PARTIAL HYSTERECTOMY    . TOTAL KNEE ARTHROPLASTY Right     Current Outpatient Prescriptions  Medication Sig Dispense Refill  . amLODipine  (NORVASC) 10 MG tablet Take 10 mg by mouth daily.    Marland Kitchen aspirin 81 MG EC tablet Take 81 mg by mouth daily.     . Brimonidine Tartrate-Timolol (COMBIGAN OP) Place 1 drop into both eyes 2 (two) times daily.     . clonazePAM (KLONOPIN) 1 MG tablet Takes 1 to 1 1/2 tablets daily as needed.    . Cyanocobalamin (VITAMIN B 12 PO) Take 1,000 mcg by mouth daily.    Marland Kitchen donepezil (ARICEPT) 10 MG tablet Take 1 tablet (10 mg total) by mouth at bedtime. [place]  tablet 12  . dorzolamide (TRUSOPT) 2 % ophthalmic solution Place 1 drop into both eyes 2 (two) times daily.    Marland Kitchen ibuprofen (ADVIL,MOTRIN) 200 MG tablet Take 200 mg by mouth every 6 (six) hours as needed.    . latanoprost (XALATAN) 0.005 % ophthalmic solution Place 1 drop into both eyes at bedtime.    . Loperamide HCl (ANTI-DIARRHEAL PO) Take by mouth. As needed     . omeprazole (PRILOSEC) 40 MG capsule Take 40 mg by mouth daily.    . sertraline (ZOLOFT) 100 MG tablet Take 100 mg by mouth daily.     . simvastatin (ZOCOR) 20 MG tablet Take 20 mg by mouth daily.    . timolol (BETIMOL) 0.5 % ophthalmic solution Place 1 drop into both eyes 2 (two) times daily.     No current facility-administered medications for this visit.     Allergies as of 02/29/2016 - Review Complete 02/29/2016  Allergen Reaction Noted  . Iohexol  12/31/2008  . Morphine      Vitals: There were no vitals taken for this visit. Last Weight:  Wt Readings from Last 1 Encounters:  02/09/16 170 lb (77.1 kg)   [id number] is no height or weight on file to calculate BMI.     Last Height:   Ht Readings from Last 1 Encounters:  02/09/16 5\' 7"  (1.702 m)    Physical exam:  General: The patient is awake, alert and appears not in acute distress. The patient is well groomed. Head: Normocephalic, atraumatic. Neck is supple. Mallampati 4  neck circumference:15 . Nasal airflow patent . Retrognathia is not seen.  Cardiovascular:  Regular rate and rhythm , without  murmurs or carotid bruit, and  without distended neck veins. Respiratory: Lungs are clear to auscultation. Skin:  Without evidence of edema, or rash Trunk: BMI is normal . The patient's posture is erect    Neurologic exam : The patient is awake and alert, oriented to place and time.    Memory testing revealed   MOCA:No flowsheet data found. MMSE: MMSE - Mini Mental State Exam 02/09/2016  Orientation to time 4  Orientation to Place 4  Registration 3  Attention/ Calculation 0  Recall 2  Language- name 2 objects 2  Language- repeat 1  Language- follow 3 step command 2  Language- read & follow direction 1  Write a sentence 1  Copy design 0  Total score 20    Attention span & concentration ability appears normal.  Speech is fluent,  without  dysarthria, dysphonia or aphasia.  Mood and affect are appropriate.  Cranial nerves: Pupils are equal and briskly reactive to light.  Visual fields blurring. Hearing to finger rub intact.   Facial sensation intact to fine touch.  Facial motor strength is symmetric and tongue and uvula move midline. Shoulder shrug was symmetrical.   Motor exam:   Normal tone, muscle bulk and symmetric strength in all extremities.   Frequent falls, Stance is stable and normal. Turning with 3  Steps. Deep tendon reflexes: in the  upper and lower extremities are symmetric and intact.   The patient was advised of the nature of the diagnosed sleep disorder , the treatment options and risks for general a health and wellness arising from not treating the condition.  I spent more than 45 minutes of face to face time with the patient. Greater than 50% of time was spent in counseling and coordination of care. We have discussed the diagnosis and differential and I  answered the patient's questions.     Assessment:  After physical and neurologic examination, review of laboratory studies,  Personal review of imaging studies, reports of other /same  Imaging studies ,  Results of polysomnography/  neurophysiology testing and pre-existing records as far as provided in visit., my assessment is   1)  [name] has been witnessed to snore but it is not quite clear how severe apnea may be or if apnea is present at all. 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 Daytime activities are interrupted by sleepiness periods and nocturnal activities have displaced sleep. This seems to have been beginning with the death of her husband last year, to whom  she was main caretaker .   2) what I would like for [name] is to try over-the-counter melatonin 3 mg and to take it regularly with her nighttime eyedrops at around 10 PM. This should help her to fall asleep quicker and to stay asleep longer. I'm happy that the family readings are in the morning to wake her up and I would like for her to start some activities that she doesn't just continue to nod off.  3)  I think that she uses the TV to produce some background noise, feeling safer and less lonely. The observations of her granddaughter that when she is in family company her sleep is uninterrupted and sound at night indicate this. I would much refer for her not to have the TV running at night as it further interrupts her sleep cycle. It may be easier for her we'll can no longer read well to use an audio bulk or something that she can do some to without having additional light infiltrating her bed space. The association of the blind has been mailed her a device and she can listen to books on tape.  I would just like to order an overnight pulse oximetry to see if [name] suffers from low oxygen levels or irregular heartbeats at night this is meant as a screening test if apnea is present. We know already that snoring is present but without the coexisting apnea she would not be a CPAP candidate. Snoring can be treated this dental devices and sometimes just this changing sleep habits such as not sleeping on the back.  I would  very much recommend for [name] to join a senior citizen organization for daytime activities and also to expand her social cycle. I do think it will help her with grieving.    Plan:  Treatment plan and additional workup : ONO,  Senior day care facility.      Allison Partridge Kaylum Shrum MD  02/29/2016   CC: Denton Lank, Md [address],  [address]

## 2016-03-13 ENCOUNTER — Telehealth: Payer: Self-pay

## 2016-03-13 NOTE — Telephone Encounter (Signed)
Received a call from [place], pt's granddaughter (per DPR). She advised me that she has not been contacted to schedule the ONO. I advised her that I was not aware Dr. Brett Fairy ordered an ONO, but I will send the order to [facility] to schedule today. Pt's granddaughter verbalized understanding.  ONO order sent to [facility].

## 2016-03-22 NOTE — Telephone Encounter (Signed)
I spoke to pt's granddaughter, Aniceto Boss, per DPR. I advised her that Dr. Brett Fairy reviewed ONO results for this pt and said that pt is not qualified for oxygen. Jinny Blossom, NP can discuss these results further with pt tomorrow. Pt's graddaughter verbalized understanding of results. Pt's granddaughter had no questions at this time but was encouraged to call back if questions arise.

## 2016-03-23 ENCOUNTER — Encounter: Payer: Self-pay | Admitting: Adult Health

## 2016-03-23 ENCOUNTER — Ambulatory Visit (INDEPENDENT_AMBULATORY_CARE_PROVIDER_SITE_OTHER): Payer: Medicare Other | Admitting: Adult Health

## 2016-03-23 VITALS — BP 138/81 | HR 58 | Resp 16 | Ht 67.0 in | Wt 170.0 lb

## 2016-03-23 DIAGNOSIS — G479 Sleep disorder, unspecified: Secondary | ICD-10-CM

## 2016-03-23 DIAGNOSIS — R0683 Snoring: Secondary | ICD-10-CM | POA: Diagnosis not present

## 2016-03-23 NOTE — Progress Notes (Signed)
I agree with the assessment and plan as directed by NP .WID   Ilya Neely, MD  

## 2016-03-23 NOTE — Patient Instructions (Signed)
Try Melatonin 3 mg at bedtime Overnight oxygen test was norma. Supplemental oxygen is not needed at this time. If your symptoms worsen or you develop new symptoms please let us know.

## 2016-03-23 NOTE — Progress Notes (Signed)
PATIENT: [name] DOB: August 22, 1940  REASON FOR VISIT: follow up HISTORY FROM: patient  HISTORY OF PRESENT ILLNESS: [name] is a 75 year old female with a history of confusional arousals, snoring and sleep disturbance. She returns today to discuss her ONO results. I explained to the patient that Dr. Brett Robertson has reviewed her ONO study and reports that it is normal. She does not qualify for supplemental oxygen at night. Patient verbalized understanding. At the last visit Dr. Brett Robertson encouraged the patient to use melatonin before bedtime. Her granddaughters with her and reports that they forgot to pick this up. They are willing to give this a try. She was also encouraged to participate in a senior citizen's group. She states that she's been going with a friend to a church group. At this group they do things such as watching movies and eating. Patient overall feels that she is doing okay. In the past she has been given Aricept however this caused vivid dreams. She is no longer on Aricept.  HISTORY 02/29/16: [name] is a 75 y.o. female , seen here as a referral/ revisit  from Dr. Jaynee Robertson, whom she has seen for memory loss.  [name] is a widow who lives alone but has daily visits from her daughter and grand- daughters. 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 She reports that when she takes her green drops for glaucoma at nighttime to help her to go to sleep and she wakes up with a much less intense headaches and she can have other days. Most headaches respond to ibuprofen by mouth. She reports that she is normally not woken by a headache but that the headache may be present at the time she wakes up anyway. She no longer is driving , but does many things in her household alone.  She sees friends from church. She had been for 30 years in [place]  As her husband struggled with alcoholism, and she is reluctant to join  another 12 step program, spent a lot of time and tears in church basements.    Sleep habits are as follows: The patient is late to go to bed, her sleep periods have more shifted towards daytime while at night she seems to stay awake for long periods of time. She sleeps in the living room on the couch , she had not used the bedroom where her husband died.  Her grand-daughter fees that she watches TV - she can not read regular books any longer, she is sleeping on and off.  She sleeps in daytime for many many naps,  She witnessed her grandmother falling asleep , while on the phone, while having a cup in her hands, and she has no set rise time , either. Her granddaughter feels she is not getting up before noon. She sleeps on 2 pillows, and has the TV on all night. She feels lonely. She sleeps best when a family member is with her.   She endorsed the Epworth score at 17 points.   Social history:  Widowed, here seen with her grand-daughter. Non drinker, non smoker,  Drinks caffeine in moderation. She is unwilling to join any activity groups.       REVIEW OF SYSTEMS: Out of a complete 14 system review of symptoms, the patient complains only of the following symptoms, and all other reviewed systems are negative.  Loss of vision, blurred vision, diarrhea, restless leg,  daytime sleepiness, snoring, joint pain, dizziness, headache, weakness, depression, nervous/anxious  ALLERGIES: Allergies  Allergen Reactions  . Iohexol      Code: HIVES, Desc: PTS GRANDDAUGHTER [name] CALLED 12/31/08 TO NOTIFY us THAT HER GRANDMOTHER HAD A REACTION TO THE IV DYE 8 HRS POST CT EXAM PT DEVELOPED HIVES/RASH.12/31/08/RM, Onset Date: [id number]   . Morphine     HOME MEDICATIONS: Outpatient Medications Prior to Visit  Medication Sig Dispense Refill  . amLODipine (NORVASC) 10 MG tablet Take 10 mg by mouth daily.    Marland Kitchen aspirin 81 MG EC tablet Take 81 mg by mouth daily.     . Brimonidine Tartrate-Timolol (COMBIGAN OP)  Place 1 drop into both eyes 2 (two) times daily.     . clonazePAM (KLONOPIN) 1 MG tablet Takes 1 to 1 1/2 tablets daily as needed.    . Cyanocobalamin (VITAMIN B 12 PO) Take 1,000 mcg by mouth daily.    Marland Kitchen donepezil (ARICEPT) 10 MG tablet Take 1 tablet (10 mg total) by mouth at bedtime. 30 tablet 12  . dorzolamide (TRUSOPT) 2 % ophthalmic solution Place 1 drop into both eyes 2 (two) times daily.    Marland Kitchen ibuprofen (ADVIL,MOTRIN) 200 MG tablet Take 200 mg by mouth every 6 (six) hours as needed.    . latanoprost (XALATAN) 0.005 % ophthalmic solution Place 1 drop into both eyes at bedtime.    . Loperamide HCl (ANTI-DIARRHEAL PO) Take by mouth. As needed     . omeprazole (PRILOSEC) 40 MG capsule Take 40 mg by mouth daily.    . sertraline (ZOLOFT) 100 MG tablet Take 100 mg by mouth daily.     . simvastatin (ZOCOR) 20 MG tablet Take 20 mg by mouth daily.    . timolol (BETIMOL) 0.5 % ophthalmic solution Place 1 drop into both eyes 2 (two) times daily.     No facility-administered medications prior to visit.     PAST MEDICAL HISTORY: Past Medical History:  Diagnosis Date  . Anxiety state, unspecified   . Arthropathy, unspecified, site unspecified   . Benign carcinoid tumor of the duodenum   . Blindness of one eye   . Breast cancer ([place]) 2009  . Cancer [facility])    breast  . Colon cancer ([place])   . Depressive disorder, not elsewhere classified   . Diarrhea   . Disease of gallbladder   . Esophageal cancer ([place])   . GERD (gastroesophageal reflux disease)   . Headache(784.0)   . Hyperlipidemia   . Hypertension   . Kidney stones   . OA (osteoarthritis) of knee    right  . Syncope and collapse   . Unspecified ectopic pregnancy without intrauterine pregnancy     PAST SURGICAL HISTORY: Past Surgical History:  Procedure Laterality Date  . BREAST BIOPSY Bilateral   . BREAST EXCISIONAL BIOPSY Right 2009   (+)  . BREAST SURGERY Right   . CHOLECYSTECTOMY  05-27-12  . COLON RESECTION    .  COLONOSCOPY    . PARTIAL HYSTERECTOMY    . TOTAL KNEE ARTHROPLASTY Right     FAMILY HISTORY: Family History  Problem Relation Age of Onset  . Cancer Mother     unknown type  . Heart attack Brother   . Diabetes Daughter     SOCIAL HISTORY: Social History   Social History  . Marital status: Widowed    Spouse name: N/A  . Number of children: N/A  . Years of education: N/A   Occupational History  .  Retired    Social History Main Topics  . Smoking status: Never Smoker  . Smokeless tobacco: Never Used  . Alcohol use No  . Drug use: No  . Sexual activity: Not on file   Other Topics Concern  . Not on file   Social History Narrative   Lives alone   Caffeine use: coffee/soda      PHYSICAL EXAM  Vitals:   03/23/16 0724  BP: 138/81  Pulse: (!) 58  Resp: 16  Weight: 170 lb (77.1 kg)  Height: 5\' 7"  (1.702 m)   Body mass index is 26.63 kg/m.  Generalized: Well developed, in no acute distress   Neurological examination  Mentation: Alert oriented to time, place, history taking. Follows all commands speech and language fluent Cranial nerve II-XII: Pupils were equal round reactive to light. Extraocular movements were full, visual field were full on confrontational test. Facial sensation and strength were normal. Uvula tongue midline. Head turning and shoulder shrug  were normal and symmetric. Motor: The motor testing reveals 5 over 5 strength of all 4 extremities. Good symmetric motor tone is noted throughout.  Sensory: Sensory testing is intact to soft touch on all 4 extremities. No evidence of extinction is noted.  Coordination: Cerebellar testing reveals good finger-nose-finger and heel-to-shin bilaterally.  Gait and station: Gait is normal. Tandem gait is unsteady. Romberg is negative. No drift is seen.  Reflexes: Deep tendon reflexes are symmetric and normal bilaterally.   DIAGNOSTIC DATA (LABS, IMAGING, TESTING) - I reviewed patient records, labs, notes, testing  and imaging myself where available.      ASSESSMENT AND PLAN 75 y.o. year old female  has a past medical history of Anxiety state, unspecified; Arthropathy, unspecified, site unspecified; Benign carcinoid tumor of the duodenum; Blindness of one eye; Breast cancer ([place]) (2009); Cancer ([place]); Colon cancer ([place]); Depressive disorder, not elsewhere classified; Diarrhea; Disease of gallbladder; Esophageal cancer ([place]); GERD (gastroesophageal reflux disease); Headache(784.0); Hyperlipidemia; Hypertension; Kidney stones; OA (osteoarthritis) of knee; Syncope and collapse; and Unspecified ectopic pregnancy without intrauterine pregnancy. here with:  1. Sleep disturbance 2. Snoring  The patient's ONO study reveals that she does not qualify for some minimal oxygen. I have encouraged the patient to pick up melatonin 3 mg to take before bedtime. Since low oxygen and irregular heart beats were not found on the ONO patient does not qualify for CPAP. This was explained to the patient. She verbalized understanding. She will follow-up on an as-needed basis. She will continue with follow-ups with Dr. Jaynee Robertson for headaches.    Allison Givens, MSN, NP-C 03/23/2016, 7:57 AM [facility] [address], [place], [place] [address] [phone]

## 2016-03-30 ENCOUNTER — Other Ambulatory Visit: Payer: Self-pay | Admitting: Family Medicine

## 2016-03-30 DIAGNOSIS — Z1231 Encounter for screening mammogram for malignant neoplasm of breast: Secondary | ICD-10-CM

## 2016-04-04 ENCOUNTER — Encounter: Payer: Self-pay | Admitting: Neurology

## 2016-05-04 ENCOUNTER — Ambulatory Visit
Admission: RE | Admit: 2016-05-04 | Discharge: 2016-05-04 | Disposition: A | Discharge status: Home / Self Care | Payer: Medicare Other | Source: Ambulatory Visit | Attending: Family Medicine | Admitting: Family Medicine

## 2016-05-04 DIAGNOSIS — Z1231 Encounter for screening mammogram for malignant neoplasm of breast: Secondary | ICD-10-CM | POA: Diagnosis present

## 2016-05-04 HISTORY — DX: Personal history of irradiation: Z92.3

## 2016-05-10 ENCOUNTER — Ambulatory Visit: Payer: Medicare Other | Admitting: Neurology

## 2016-05-10 ENCOUNTER — Telehealth: Payer: Self-pay | Admitting: *Deleted

## 2016-05-10 NOTE — Telephone Encounter (Signed)
called and cx same day for f/u appt

## 2016-05-11 ENCOUNTER — Encounter: Payer: Self-pay | Admitting: Neurology

## 2016-07-06 ENCOUNTER — Ambulatory Visit (INDEPENDENT_AMBULATORY_CARE_PROVIDER_SITE_OTHER): Payer: Medicare Other | Admitting: Neurology

## 2016-07-06 VITALS — BP 110/79 | HR 68 | Ht 67.0 in | Wt 169.0 lb

## 2016-07-06 DIAGNOSIS — G44229 Chronic tension-type headache, not intractable: Secondary | ICD-10-CM

## 2016-07-06 DIAGNOSIS — F0391 Unspecified dementia with behavioral disturbance: Secondary | ICD-10-CM

## 2016-07-06 DIAGNOSIS — W19XXXA Unspecified fall, initial encounter: Secondary | ICD-10-CM

## 2016-07-06 DIAGNOSIS — R269 Unspecified abnormalities of gait and mobility: Secondary | ICD-10-CM

## 2016-07-06 NOTE — Progress Notes (Signed)
[id number]    Provider:  Dr Jaynee Eagles Referring Provider: Denton Lank, MD Primary Care Physician:  Baltazar Apo, MD   CC:  Memory loss  Interval history 07/06/2016: Patient returns today. MMSE improved from 20/30 to 25/30. She wants to discuss a new problem headaches. She has had a few falls in her home. A toy was on the floor and she just fell on the floor but didn;t hit anything. Sleep study was negative. She has headaches. She has "high pressures in my eyes" and that is being treated. Headaches are in the frontal areas. She has them every day. Ibuprofen helps. She takes it every once in a while. She has been complaining of the headaches and dizziness more frequently. Daughter feels something is different. They are going to see the eye doctor and her primary care she feels she is lightheaded.  She has headaches when she gets up and the headaches are there tea helps. She has always had headaches and sometimes they are worse. She has had headaches all her life. She used to have migraines.She gets agitated. She does drinka  Lot of fluids.   Ward Givens 03/2016: HISTORY OF PRESENT ILLNESS: Ms. Woode is a 76 year old female with a history of confusional arousals, snoring and sleep disturbance. She returns today to discuss her ONO results. I explained to the patient that Dr. Brett Fairy has reviewed her ONO study and reports that it is normal. She does not qualify for supplemental oxygen at night. Patient verbalized understanding. At the last visit Dr. Brett Fairy encouraged the patient to use melatonin before bedtime. Her granddaughters with her and reports that they forgot to pick this up. They are willing to give this a try. She was also encouraged to participate in a senior citizen's group. She states that she's been going with a friend to a church group. At this group they do things such as watching movies and eating. Patient overall feels that she is doing okay. In the past she has been  given Aricept however this caused vivid dreams. She is no longer on Aricept.   HPI 02/2016:  FARRIE USREY is a 76 y.o. female here as a referral from Dr. Posey Pronto for memory loss. Past medical history of hypertension, high cholesterol, migraine, depression, anxiety, breast cancer and colon cancer. Here with daughter and granddaughter who provide most information. She loses things. She repeats herself. Started 2 years ago and worsening. She had a brother in his 25s maybe with memory loss but also had a stroke, no FHx of Alzheimer's dementia. More short-term memory loss. She forgets things and later remembers it like names. She forgets names and faces. She has forgotten things that they discussed multiple times over the years and patient does not remember. A lot of little things. She lives alone. She does not drive. Patient pays the bills but needs help now. She needs help getting her medications. She does not want to eat. Her husband just dies a year ago and they were married 50 years ago. She has had migraine headaches for years and she has had chronic headaches for years since daughter remembers and now they feel like daily pressure hedaches. Granddaughter thinks she is depressed. Says that patient will repeat things even if she just told them earlier. She hides things and can;t find them. Headaches are in the morning when she wakes up every day. She snores badly per family. Headaches are daily she wakes with them every morning.   Reviewed MRi brain images  personally and with patient and family: Mild atrophy and chronic microvascular ischemic change. Tiny RIGHT thalamic focus chronic hemorrhage, likely sequelae of hypertensive cerebrovascular disease.  No acute intracranial findings are evident.   Reviewed notes, labs and imaging from outside physicians, which showed:   Labs drawn 12/30/2015: CMP was unremarkable with creatinine 0.91, B12 was 1037, folate 13.2.    Her primary care notes,  she has been depressed, flat affect sometimes tearful, doesn't want to talk to counselor may be in the future. Patient has headache on and off. She suspected tension headache. Tried stretching increasing fluids for hydration, may take Tylenol, MRI was ordered.  MRI of the brain (personally reviewed images 01/22/2016) and agree with the following: Mild atrophy and chronic microvascular ischemic change. Tiny RIGHT thalamic focus chronic hemorrhage, likely sequelae of hypertensive cerebrovascular disease.  No acute intracranial findings are evident. Review of Systems: Patient complains of symptoms per HPI as well as the following symptoms: Loss of vision, blurred vision, diarrhea, insomnia, daytime sleepiness, joint pain, aching muscles, walking difficulty, memory loss, dizziness, headache, weakness, agitation, depression, nervousness Pertinent negatives per HPI. All others negative.  Social History   Social History  . Marital status: Widowed    Spouse name: N/A  . Number of children: N/A  . Years of education: N/A   Occupational History  . Retired    Social History Main Topics  . Smoking status: Never Smoker  . Smokeless tobacco: Never Used  . Alcohol use No  . Drug use: No  . Sexual activity: Not on file   Other Topics Concern  . Not on file   Social History Narrative   Lives alone   Caffeine use: coffee/soda    Family History  Problem Relation Age of Onset  . Cancer Mother     unknown type  . Heart attack Brother   . Diabetes Daughter   . Breast cancer Neg Hx     Past Medical History:  Diagnosis Date  . Anxiety state, unspecified   . Arthropathy, unspecified, site unspecified   . Benign carcinoid tumor of the duodenum   . Blindness of one eye   . Breast cancer ([place]) 2009  . Cancer [facility])    breast  . Colon cancer ([place])   . Depressive disorder, not elsewhere classified   . Diarrhea   . Disease of gallbladder   . Esophageal cancer ([place])   . GERD  (gastroesophageal reflux disease)   . Headache(784.0)   . Hyperlipidemia   . Hypertension   . Kidney stones   . OA (osteoarthritis) of knee    right  . Personal history of radiation therapy   . Syncope and collapse   . Unspecified ectopic pregnancy without intrauterine pregnancy     Past Surgical History:  Procedure Laterality Date  . BREAST BIOPSY Bilateral   . BREAST EXCISIONAL BIOPSY Right 2009   (+)  . BREAST SURGERY Right   . CHOLECYSTECTOMY  05-27-12  . COLON RESECTION    . COLONOSCOPY    . PARTIAL HYSTERECTOMY    . TOTAL KNEE ARTHROPLASTY Right     Current Outpatient Prescriptions  Medication Sig Dispense Refill  . amLODipine (NORVASC) 10 MG tablet Take 10 mg by mouth daily.    Marland Kitchen aspirin 81 MG EC tablet Take 81 mg by mouth daily.     . Brimonidine Tartrate-Timolol (COMBIGAN OP) Place 1 drop into both eyes 2 (two) times daily.     . clonazePAM (KLONOPIN) 1 MG tablet Take  1 mg by mouth 2 (two) times daily as needed. Takes 1 to 1 1/2 tablets daily as needed.    . Cyanocobalamin (VITAMIN B 12 PO) Take 1,000 mcg by mouth daily.    . dorzolamide (TRUSOPT) 2 % ophthalmic solution Place 1 drop into both eyes 2 (two) times daily.    Marland Kitchen ibuprofen (ADVIL,MOTRIN) 200 MG tablet Take 200 mg by mouth every 6 (six) hours as needed.    . latanoprost (XALATAN) 0.005 % ophthalmic solution Place 1 drop into both eyes at bedtime.    . Loperamide HCl (ANTI-DIARRHEAL PO) Take by mouth. As needed     . omeprazole (PRILOSEC) 40 MG capsule Take 40 mg by mouth daily.    . sertraline (ZOLOFT) 100 MG tablet Take 100 mg by mouth daily. 1.5 tablets po daily for depression    . simvastatin (ZOCOR) 20 MG tablet Take 20 mg by mouth daily.     No current facility-administered medications for this visit.     Allergies as of 07/06/2016 - Review Complete 03/23/2016  Allergen Reaction Noted  . Iohexol  12/31/2008  . Morphine      Vitals: BP 110/79 (BP Location: Right Arm, Patient Position: Sitting,  Cuff Size: Large)   Pulse 68   Ht 5\' 7"  (1.702 m)   Wt 169 lb (76.7 kg)   BMI 26.47 kg/m  Last Weight:  Wt Readings from Last 1 Encounters:  07/06/16 169 lb (76.7 kg)   Last Height:   Ht Readings from Last 1 Encounters:  07/06/16 5\' 7"  (1.702 m)   MMSE - Mini Mental State Exam 07/06/2016 02/09/2016  Orientation to time 5 4  Orientation to Place 4 4  Registration 3 3  Attention/ Calculation 2 0  Recall 3 2  Language- name 2 objects 2 2  Language- repeat 1 1  Language- follow 3 step command 3 2  Language- read & follow direction 1 1  Write a sentence 1 1  Copy design 0 0  Total score 25 20    Cranial Nerves:    The pupils are equal, round, and reactive to light. Attempted funduscopic exam could not visualize due to small pupils. Visual fields are full to finger confrontation. Extraocular movements are intact. Trigeminal sensation is intact and the muscles of mastication are normal. The face is symmetric. The palate elevates in the midline. Hearing intact. Voice is normal. Shoulder shrug is normal. The tongue has normal motion without fasciculations.   Coordination:  No dysmetria  Gait:    slow, imbalance  Motor Observation:    No asymmetry, no atrophy, and no involuntary movements noted. Tone:    Normal muscle tone.    Posture:    Posture is normal. normal erect    Strength:    Strength is 4/V in the upper and lower limbs.      Sensation: intact to LT     Reflex Exam:  DTR's:    Deep tendon reflexes in the upper and lower extremities are brisk for age bilaterally.   Toes:    The toes are downgoing bilaterally.   Clonus:    Clonus is absent.     Assessment/Plan:  76 year old patient with memory loss, chronic headaches, thalamic hemorrhage.  Tiny RIGHT thalamic focus chronic hemorrhage, likely sequelae of hypertensive cerebrovascular disease. Monitor blood pressure  Snoring, memory loss, daytime somnolence, morning headache: sleep eval was  negative  As far as your medications are concerned, I would like to suggest: Suggest Depakote for headaches  and can also help with agitation, Home physical therapy with nursing and a home safety study  [place] for nursing for medication management, PT for gait and safety and a home safety inspection. Here with [place] [facility]. [name]'s number is [phone].   As far as your medications are concerned, I would like to suggest: Continue Aricept I would like to see you back in 3 months, sooner if we need to. Please call us with any interim questions, concerns, problems, updates or refill requests.   Our phone number is [phone]. We also have an after hours call service for urgent matters and there is a physician on-call for urgent questions. For any emergencies you know to call 911 or go to the nearest emergency room  Chronic headaches: can address more at next appointment. Discussed the following:  To prevent or relieve headaches, try the following:  Cool Compress. Lie down and place a cool compress on your head.   Avoid headache triggers. If certain foods or odors seem to have triggered your migraines in the past, avoid them. A headache diary might help you identify triggers.   Include physical activity in your daily routine. Try a daily walk or other moderate aerobic exercise.   Manage stress. Find healthy ways to cope with the stressors, such as delegating tasks on your to-do list.   Practice relaxation techniques. Try deep breathing, yoga, massage and visualization.   Eat regularly. Eating regularly scheduled meals and maintaining a healthy diet might help prevent headaches. Also, drink plenty of fluids.   Follow a regular sleep schedule. Sleep deprivation might contribute to headaches  Consider biofeedback. With this mind-body technique, you learn to control certain bodily functions - such as muscle tension, heart rate and blood pressure - to prevent headaches or  reduce headache pain.    Proceed to emergency room if you experience new or worsening symptoms or symptoms do not resolve, if you have new neurologic symptoms or if headache is severe, or for any concerning symptom.    Sarina Ill, MD  [facility] [address] [place], [place] [address]  Phone [phone] Fax [phone] A total of 30 minutes was spent face-to-face with this patient. Over half this time was spent on counseling patient on the gait abnormality, falls, dementia, headache diagnosis and different diagnostic and therapeutic options available.

## 2016-07-06 NOTE — Patient Instructions (Addendum)
Remember to drink plenty of fluid, eat healthy meals and do not skip any meals. Try to eat protein with a every meal and eat a healthy snack such as fruit or nuts in between meals. Try to keep a regular sleep-wake schedule and try to exercise daily, particularly in the form of walking, 20-30 minutes a day, if you can.   As far as your medications are concerned, I would like to suggest: Suggest Depakote for headaches and can also help with agitation, Home physical therapy with nursing and a home safety study  I would like to see you back in 3 months, sooner if we need to. Please call us with any interim questions, concerns, problems, updates or refill requests.   Our phone number is [phone]. We also have an after hours call service for urgent matters and there is a physician on-call for urgent questions. For any emergencies you know to call 911 or go to the nearest emergency room

## 2016-07-14 ENCOUNTER — Telehealth: Payer: Self-pay | Admitting: Neurology

## 2016-07-14 NOTE — Telephone Encounter (Signed)
Jessie/[facility] [phone] called said he saw the pt yesterday, needs orders for nursing, PT & OT eval and medical social worker. He said sodium naproxen 500mg  1 tab bid is not on the medication list from the clinic, pt is taking this med for left shoulder pain.

## 2016-07-14 NOTE — Telephone Encounter (Signed)
Returned call to [facility]. Gave verbal order for SN, PT, OT and SW for pt. Also verified that pt was not taking both naproxen and ibuprofen due to increased risk for bleeding. Verbalized understanding and appreciation for call. Reports that pt is taking naproxen scheduled for 10 days and then prn. Says that she's not really using ibuprofen at this time.

## 2016-07-17 ENCOUNTER — Telehealth: Payer: Self-pay | Admitting: Neurology

## 2016-07-17 NOTE — Telephone Encounter (Signed)
[name] with [name] called in reference to getting a verbal for 1 week 1 & 2 week 3.  Working on strengthening, balance, gait balance, homes exercises, and safety/fall prevention.  Please call

## 2016-07-17 NOTE — Telephone Encounter (Signed)
Returned call to [place], PT w/ [facility]. Gave verbal OK for therapy scheduled as requested.

## 2016-08-10 NOTE — Telephone Encounter (Signed)
Home health cert and POC signed and faxed back to [facility]. Pt goals: be more outgoing, feel more independent. Plan for SN 1 wk 1, 2 wk 1, 1 wk 1; PT 1 wk 1, 2 wk 3; OT 1 wk 1; MSW 2 wk 2.

## 2016-08-15 NOTE — Telephone Encounter (Signed)
Received Episode Detail Report from [facility]. Pt d/c'd 08/11/16 to home/self-care.

## 2016-08-16 ENCOUNTER — Observation Stay: Payer: Medicare Other

## 2016-08-16 ENCOUNTER — Emergency Department: Payer: Medicare Other

## 2016-08-16 ENCOUNTER — Observation Stay
Admission: EM | Admit: 2016-08-16 | Discharge: 2016-08-17 | Disposition: A | Discharge status: Home / Self Care | Payer: Medicare Other | Attending: Internal Medicine | Admitting: Internal Medicine

## 2016-08-16 ENCOUNTER — Encounter: Payer: Self-pay | Admitting: Emergency Medicine

## 2016-08-16 DIAGNOSIS — Z888 Allergy status to other drugs, medicaments and biological substances status: Secondary | ICD-10-CM | POA: Diagnosis not present

## 2016-08-16 DIAGNOSIS — I517 Cardiomegaly: Secondary | ICD-10-CM | POA: Diagnosis not present

## 2016-08-16 DIAGNOSIS — Z90710 Acquired absence of both cervix and uterus: Secondary | ICD-10-CM | POA: Insufficient documentation

## 2016-08-16 DIAGNOSIS — H5440 Blindness, one eye, unspecified eye: Secondary | ICD-10-CM | POA: Diagnosis not present

## 2016-08-16 DIAGNOSIS — E785 Hyperlipidemia, unspecified: Secondary | ICD-10-CM | POA: Insufficient documentation

## 2016-08-16 DIAGNOSIS — F329 Major depressive disorder, single episode, unspecified: Secondary | ICD-10-CM | POA: Insufficient documentation

## 2016-08-16 DIAGNOSIS — F419 Anxiety disorder, unspecified: Secondary | ICD-10-CM | POA: Insufficient documentation

## 2016-08-16 DIAGNOSIS — Z885 Allergy status to narcotic agent status: Secondary | ICD-10-CM | POA: Insufficient documentation

## 2016-08-16 DIAGNOSIS — Z87442 Personal history of urinary calculi: Secondary | ICD-10-CM | POA: Insufficient documentation

## 2016-08-16 DIAGNOSIS — R079 Chest pain, unspecified: Secondary | ICD-10-CM | POA: Diagnosis present

## 2016-08-16 DIAGNOSIS — I119 Hypertensive heart disease without heart failure: Secondary | ICD-10-CM | POA: Diagnosis not present

## 2016-08-16 DIAGNOSIS — X58XXXA Exposure to other specified factors, initial encounter: Secondary | ICD-10-CM | POA: Insufficient documentation

## 2016-08-16 DIAGNOSIS — G8929 Other chronic pain: Secondary | ICD-10-CM | POA: Diagnosis not present

## 2016-08-16 DIAGNOSIS — Z8249 Family history of ischemic heart disease and other diseases of the circulatory system: Secondary | ICD-10-CM | POA: Insufficient documentation

## 2016-08-16 DIAGNOSIS — Y939 Activity, unspecified: Secondary | ICD-10-CM | POA: Diagnosis not present

## 2016-08-16 DIAGNOSIS — M129 Arthropathy, unspecified: Secondary | ICD-10-CM | POA: Insufficient documentation

## 2016-08-16 DIAGNOSIS — Z79899 Other long term (current) drug therapy: Secondary | ICD-10-CM | POA: Insufficient documentation

## 2016-08-16 DIAGNOSIS — R0789 Other chest pain: Secondary | ICD-10-CM | POA: Diagnosis not present

## 2016-08-16 DIAGNOSIS — J479 Bronchiectasis, uncomplicated: Secondary | ICD-10-CM | POA: Insufficient documentation

## 2016-08-16 DIAGNOSIS — Z923 Personal history of irradiation: Secondary | ICD-10-CM | POA: Insufficient documentation

## 2016-08-16 DIAGNOSIS — K449 Diaphragmatic hernia without obstruction or gangrene: Secondary | ICD-10-CM | POA: Insufficient documentation

## 2016-08-16 DIAGNOSIS — S069X9A Unspecified intracranial injury with loss of consciousness of unspecified duration, initial encounter: Secondary | ICD-10-CM | POA: Diagnosis not present

## 2016-08-16 DIAGNOSIS — Z9049 Acquired absence of other specified parts of digestive tract: Secondary | ICD-10-CM | POA: Insufficient documentation

## 2016-08-16 DIAGNOSIS — Z85038 Personal history of other malignant neoplasm of large intestine: Secondary | ICD-10-CM | POA: Insufficient documentation

## 2016-08-16 DIAGNOSIS — R197 Diarrhea, unspecified: Secondary | ICD-10-CM | POA: Insufficient documentation

## 2016-08-16 DIAGNOSIS — M1711 Unilateral primary osteoarthritis, right knee: Secondary | ICD-10-CM | POA: Diagnosis not present

## 2016-08-16 DIAGNOSIS — Z8501 Personal history of malignant neoplasm of esophagus: Secondary | ICD-10-CM | POA: Insufficient documentation

## 2016-08-16 DIAGNOSIS — K219 Gastro-esophageal reflux disease without esophagitis: Secondary | ICD-10-CM | POA: Diagnosis not present

## 2016-08-16 DIAGNOSIS — Z833 Family history of diabetes mellitus: Secondary | ICD-10-CM | POA: Insufficient documentation

## 2016-08-16 DIAGNOSIS — M25512 Pain in left shoulder: Secondary | ICD-10-CM | POA: Diagnosis not present

## 2016-08-16 DIAGNOSIS — Z853 Personal history of malignant neoplasm of breast: Secondary | ICD-10-CM | POA: Insufficient documentation

## 2016-08-16 DIAGNOSIS — R55 Syncope and collapse: Secondary | ICD-10-CM | POA: Diagnosis not present

## 2016-08-16 DIAGNOSIS — Z809 Family history of malignant neoplasm, unspecified: Secondary | ICD-10-CM | POA: Insufficient documentation

## 2016-08-16 DIAGNOSIS — Z7982 Long term (current) use of aspirin: Secondary | ICD-10-CM | POA: Insufficient documentation

## 2016-08-16 LAB — TROPONIN I
Troponin I: <0.03 ng/mL (ref <0.03)
Troponin I: <0.03 ng/mL (ref <0.03)

## 2016-08-16 LAB — BASIC METABOLIC PANEL
ANION GAP: 7 (ref 5–15)
BUN: 10 mg/dL (ref 6–20)
CALCIUM: 9.3 mg/dL (ref 8.9–10.3)
CO2: 25 mmol/L (ref 22–32)
Chloride: 107 mmol/L (ref 101–111)
Creatinine, Ser: 0.7 mg/dL (ref 0.44–1.00)
GFR calc Af Amer: >60 mL/min (ref >60)
Glucose, Bld: 108 mg/dL — ABNORMAL HIGH (ref 65–99)
POTASSIUM: 3.3 mmol/L — AB (ref 3.5–5.1)
Sodium: 139 mmol/L (ref 135–145)

## 2016-08-16 LAB — CBC
HEMATOCRIT: 38.5 % (ref 35.0–47.0)
Hemoglobin: 13.1 g/dL (ref 12.0–16.0)
MCH: 30.5 pg (ref 26.0–34.0)
MCHC: 34.1 g/dL (ref 32.0–36.0)
MCV: 89.4 fL (ref 80.0–100.0)
Platelets: 208 10*3/uL (ref 150–440)
RBC: 4.3 MIL/uL (ref 3.80–5.20)
RDW: 15.7 % — AB (ref 11.5–14.5)
WBC: 5 10*3/uL (ref 3.6–11.0)

## 2016-08-16 LAB — MAGNESIUM: MAGNESIUM: 1.8 mg/dL (ref 1.7–2.4)

## 2016-08-16 LAB — BRAIN NATRIURETIC PEPTIDE: B NATRIURETIC PEPTIDE 5: 76 pg/mL (ref 0.0–100.0)

## 2016-08-16 LAB — FIBRIN DERIVATIVES D-DIMER (ARMC ONLY): Fibrin derivatives D-dimer (ARMC): 752.23 — ABNORMAL HIGH (ref 0.00–499.00)

## 2016-08-16 MED ORDER — SODIUM CHLORIDE 0.9 % IV SOLN: 250.0000 mL | INTRAVENOUS | Status: DC | PRN

## 2016-08-16 MED ORDER — ENOXAPARIN SODIUM 40 MG/0.4ML ~~LOC~~ SOLN
40.0000 mg | SUBCUTANEOUS | Status: DC
  Administered 2016-08-17: 40 mg via SUBCUTANEOUS
  Filled 2016-08-16: qty 0.4

## 2016-08-16 MED ORDER — ASPIRIN EC 81 MG PO TBEC
81.0000 mg | DELAYED_RELEASE_TABLET | Freq: Every day | ORAL | Status: DC
  Filled 2016-08-16: qty 1

## 2016-08-16 MED ORDER — HYDROCORTISONE NA SUCCINATE PF 100 MG IJ SOLR
200.0000 mg | Freq: Once | INTRAMUSCULAR | Status: AC
  Administered 2016-08-16: 200 mg via INTRAVENOUS
  Filled 2016-08-16: qty 4

## 2016-08-16 MED ORDER — ONDANSETRON HCL 4 MG/2ML IJ SOLN: 4.0000 mg | Freq: Four times a day (QID) | INTRAMUSCULAR | Status: DC | PRN

## 2016-08-16 MED ORDER — ACETAMINOPHEN 325 MG PO TABS: 650.0000 mg | ORAL_TABLET | Freq: Four times a day (QID) | ORAL | Status: DC | PRN

## 2016-08-16 MED ORDER — LATANOPROST 0.005 % OP SOLN
1.0000 [drp] | Freq: Every day | OPHTHALMIC | Status: DC
  Administered 2016-08-17: 1 [drp] via OPHTHALMIC
  Filled 2016-08-16: qty 2.5

## 2016-08-16 MED ORDER — ACETAMINOPHEN 650 MG RE SUPP: 650.0000 mg | Freq: Four times a day (QID) | RECTAL | Status: DC | PRN

## 2016-08-16 MED ORDER — SODIUM CHLORIDE 0.9% FLUSH
3.0000 mL | Freq: Two times a day (BID) | INTRAVENOUS | Status: DC
  Administered 2016-08-17 (×2): 3 mL via INTRAVENOUS

## 2016-08-16 MED ORDER — HYDROCORTISONE NA SUCCINATE PF 100 MG IJ SOLR
INTRAMUSCULAR | Status: AC
  Filled 2016-08-16: qty 2

## 2016-08-16 MED ORDER — IOPAMIDOL (ISOVUE-370) INJECTION 76%
75.0000 mL | Freq: Once | INTRAVENOUS | Status: AC | PRN
Start: 2016-08-16 — End: 2016-08-16
  Administered 2016-08-16: 75 mL via INTRAVENOUS

## 2016-08-16 MED ORDER — SODIUM CHLORIDE 0.9 % IV BOLUS (SEPSIS)
500.0000 mL | Freq: Once | INTRAVENOUS | Status: AC
  Administered 2016-08-16: 500 mL via INTRAVENOUS

## 2016-08-16 MED ORDER — ONDANSETRON HCL 4 MG PO TABS: 4.0000 mg | ORAL_TABLET | Freq: Four times a day (QID) | ORAL | Status: DC | PRN

## 2016-08-16 MED ORDER — NITROGLYCERIN 0.4 MG SL SUBL
0.4000 mg | SUBLINGUAL_TABLET | SUBLINGUAL | Status: DC | PRN
  Administered 2016-08-16: 0.4 mg via SUBLINGUAL
  Filled 2016-08-16: qty 1

## 2016-08-16 MED ORDER — IBUPROFEN 400 MG PO TABS
600.0000 mg | ORAL_TABLET | Freq: Once | ORAL | Status: AC
  Administered 2016-08-17: 600 mg via ORAL
  Filled 2016-08-16: qty 2

## 2016-08-16 MED ORDER — SODIUM CHLORIDE 0.9% FLUSH: 3.0000 mL | INTRAVENOUS | Status: DC | PRN

## 2016-08-16 MED ORDER — DIPHENHYDRAMINE HCL 50 MG/ML IJ SOLN
50.0000 mg | Freq: Once | INTRAMUSCULAR | Status: AC
  Administered 2016-08-16: 50 mg via INTRAVENOUS
  Filled 2016-08-16: qty 1

## 2016-08-16 NOTE — H&P (Signed)
[facility] NAME: [name]    MR#:  [id number]  DATE OF BIRTH:  1941-04-01  DATE OF ADMISSION:  08/16/2016  PRIMARY CARE PHYSICIAN: Baltazar Apo, MD   REQUESTING/REFERRING PHYSICIAN: Dr. Quentin Cornwall  CHIEF COMPLAINT:   Chief Complaint  Patient presents with  . Chest Pain    HISTORY OF PRESENT ILLNESS:  [name]  is a 76 y.o. female with a known history of Hypertension, anxiety, chronic diarrhea presents to the emergency room with acute onset cough midsternal chest tightness early in the morning. Patient has chronic left shoulder pain which is unchanged. This chest tightness radiated into the left arm. She is not sure if the pain radiating in the left arm is her chronic pain or from the chest tightness. She received nitroglycerin with no response. But slowly the pain improved while she was in the emergency room. Here she has been found to have elevated d-dimer and a CTA of the chest has been ordered. But she does have contrast allergy has received hydrocortisone and is waiting for her Benadryl shot prior to getting the CT scan. Here her troponin and EKG are normal. She follows with Dr. Rockey Situ as outpatient. Last saw him in 2017.  PAST MEDICAL HISTORY:   Past Medical History:  Diagnosis Date  . Anxiety state, unspecified   . Arthropathy, unspecified, site unspecified   . Benign carcinoid tumor of the duodenum   . Blindness of one eye   . Breast cancer ([place]) 2009  . Cancer [facility])    breast  . Colon cancer ([place])   . Depressive disorder, not elsewhere classified   . Diarrhea   . Disease of gallbladder   . Esophageal cancer ([place])   . GERD (gastroesophageal reflux disease)   . Headache(784.0)   . Hyperlipidemia   . Hypertension   . Kidney stones   . OA (osteoarthritis) of knee    right  . Personal history of radiation therapy   . Syncope and collapse   . Unspecified ectopic pregnancy without intrauterine pregnancy      PAST SURGICAL HISTORY:   Past Surgical History:  Procedure Laterality Date  . BREAST BIOPSY Bilateral   . BREAST EXCISIONAL BIOPSY Right 2009   (+)  . BREAST SURGERY Right   . CHOLECYSTECTOMY  05-27-12  . COLON RESECTION    . COLONOSCOPY    . PARTIAL HYSTERECTOMY    . TOTAL KNEE ARTHROPLASTY Right     SOCIAL HISTORY:   Social History  Substance Use Topics  . Smoking status: Never Smoker  . Smokeless tobacco: Never Used  . Alcohol use No    FAMILY HISTORY:   Family History  Problem Relation Age of Onset  . Cancer Mother     unknown type  . Heart attack Brother   . Diabetes Daughter   . Breast cancer Neg Hx     DRUG ALLERGIES:   Allergies  Allergen Reactions  . Iohexol      Code: HIVES, Desc: PTS GRANDDAUGHTER [name] CALLED 12/31/08 TO NOTIFY us THAT HER GRANDMOTHER HAD A REACTION TO THE IV DYE 8 HRS POST CT EXAM PT DEVELOPED HIVES/RASH.12/31/08/RM, Onset Date: [id number]   . Morphine     REVIEW OF SYSTEMS:   Review of Systems  Constitutional: Positive for malaise/fatigue. Negative for chills, fever and weight loss.  HENT: Negative for hearing loss and nosebleeds.   Eyes: Negative for blurred vision, double vision and pain.  Respiratory: Negative for  cough, hemoptysis, sputum production, shortness of breath and wheezing.   Cardiovascular: Positive for chest pain. Negative for palpitations, orthopnea and leg swelling.  Gastrointestinal: Positive for diarrhea (chronic). Negative for abdominal pain, constipation, nausea and vomiting.  Genitourinary: Negative for dysuria and hematuria.  Musculoskeletal: Negative for back pain, falls and myalgias.  Skin: Negative for rash.  Neurological: Positive for weakness. Negative for dizziness, tremors, sensory change, speech change, focal weakness, seizures and headaches.  Endo/Heme/Allergies: Does not bruise/bleed easily.  Psychiatric/Behavioral: Negative for depression and memory loss. The patient is not nervous/anxious.      MEDICATIONS AT HOME:   Prior to Admission medications   Medication Sig Start Date End Date Taking? Authorizing Provider  amLODipine (NORVASC) 10 MG tablet Take 10 mg by mouth daily.    Historical Provider, MD  aspirin 81 MG EC tablet Take 81 mg by mouth daily.     Historical Provider, MD  Brimonidine Tartrate-Timolol (COMBIGAN OP) Place 1 drop into both eyes 2 (two) times daily.     Historical Provider, MD  clonazePAM (KLONOPIN) 1 MG tablet Take 1 mg by mouth 2 (two) times daily as needed. Takes 1 to 1 1/2 tablets daily as needed.    Historical Provider, MD  Cyanocobalamin (VITAMIN B 12 PO) Take 1,000 mcg by mouth daily.    Historical Provider, MD  dorzolamide (TRUSOPT) 2 % ophthalmic solution Place 1 drop into both eyes 2 (two) times daily.    Historical Provider, MD  ibuprofen (ADVIL,MOTRIN) 200 MG tablet Take 200 mg by mouth every 6 (six) hours as needed.    Historical Provider, MD  latanoprost (XALATAN) 0.005 % ophthalmic solution Place 1 drop into both eyes at bedtime.    Historical Provider, MD  Loperamide HCl (ANTI-DIARRHEAL PO) Take by mouth. As needed     Historical Provider, MD  naproxen (NAPROSYN) 500 MG tablet Take 500 mg by mouth 2 (two) times daily with a meal.    Historical Provider, MD  omeprazole (PRILOSEC) 40 MG capsule Take 40 mg by mouth daily.    Historical Provider, MD  sertraline (ZOLOFT) 100 MG tablet Take 100 mg by mouth daily. 1.5 tablets po daily for depression    Historical Provider, MD  simvastatin (ZOCOR) 20 MG tablet Take 20 mg by mouth daily.    Historical Provider, MD     VITAL SIGNS:  Blood pressure (!) 160/88, pulse 62, temperature 98.7 F (37.1 C), temperature source Oral, resp. rate 19, height 5\' 7"  (1.702 m), weight 77.6 kg (171 lb), SpO2 100 %.  PHYSICAL EXAMINATION:  Physical Exam  GENERAL:  76 y.o.-year-old patient lying in the bed with no acute distress.  EYES: Pupils equal, round, reactive to light and accommodation. No scleral icterus.  Extraocular muscles intact.  HEENT: Head atraumatic, normocephalic. Oropharynx and nasopharynx clear. No oropharyngeal erythema, moist oral mucosa  NECK:  Supple, no jugular venous distention. No thyroid enlargement, no tenderness.  LUNGS: Normal breath sounds bilaterally, no wheezing, rales, rhonchi. No use of accessory muscles of respiration.  CARDIOVASCULAR: S1, S2 normal. No murmurs, rubs, or gallops. Chest wall tenderness ABDOMEN: Soft, nontender, nondistended. Bowel sounds present. No organomegaly or mass.  EXTREMITIES: No pedal edema, cyanosis, or clubbing. + 2 pedal & radial pulses b/l.   NEUROLOGIC: Cranial nerves II through XII are intact. No focal Motor or sensory deficits appreciated b/l PSYCHIATRIC: The patient is alert and oriented x 3. Good affect.  SKIN: No obvious rash, lesion, or ulcer.   LABORATORY PANEL:   CBC  Recent Labs Lab 08/16/16 1151  WBC 5.0  HGB 13.1  HCT 38.5  PLT 208   ------------------------------------------------------------------------------------------------------------------  Chemistries   Recent Labs Lab 08/16/16 1151  NA 139  K 3.3*  CL 107  CO2 25  GLUCOSE 108*  BUN 10  CREATININE 0.70  CALCIUM 9.3  MG 1.8   ------------------------------------------------------------------------------------------------------------------  Cardiac Enzymes  Recent Labs Lab 08/16/16 1601  TROPONINI <0.03   ------------------------------------------------------------------------------------------------------------------  RADIOLOGY:  Dg Chest 2 View  Result Date: 08/16/2016 CLINICAL DATA:  Chest pain.  Shoulder pain. EXAM: CHEST  2 VIEW COMPARISON:  05/17/2012. FINDINGS: Mediastinum hilar structures normal. Cardiomegaly with tortuous thoracic aorta again noted. No focal alveolar infiltrate. Basilar subsegmental atelectasis and/or scarring. Prominence sliding hiatal hernia. Thoracic spine scoliosis degenerative change. IMPRESSION: 1.  Stable  cardiomegaly.  Stable tortuosity of the thoracic aorta. 2.  Stable bibasilar atelectasis and/or scarring. 3. Sliding hiatal hernia again noted . Electronically Signed   By: Marcello Moores  Register   On: 08/16/2016 15:08     IMPRESSION AND PLAN:   * Chest pain, atypical Elevation in d-dimer. Waiting for a CT scan of the chest. Allergy to contrast and patient received Solu-Cortef and Benadryl in the ER. Will need to rule out acute coronary syndrome. Check 2 more sets of troponin. Telemetry monitoring. Troponin and CT scan are negative could be muscular skeletal pain. Will need cardiology follow-up as outpatient. If positive for PE will need anticoagulation. No recent bleeding.  * Hypertension. Continue home medications.  * Anxiety  * DVT prophylaxis with Lovenox   All the records are reviewed and case discussed with ED provider. Management plans discussed with the patient, family and they are in agreement.  CODE STATUS: FULL CODE  TOTAL TIME TAKING CARE OF THIS PATIENT: 40 minutes.   Hillary Bow R M.D on 08/16/2016 at 6:51 PM  Between 7am to 6pm - Pager - [phone]  After 6pm go to [email or web address] - password [email or web address] [facility]  Office  [phone]  CC: Primary care physician; Baltazar Apo, MD  Note: This dictation was prepared with Dragon dictation along with smaller phrase technology. Any transcriptional errors that result from this process are unintentional.

## 2016-08-16 NOTE — ED Notes (Signed)
Pt resting with eyes closed after meds.  Family with pt.  Pt waiting on scan.

## 2016-08-16 NOTE — ED Triage Notes (Signed)
Says she has chest pain since this am.  Has had left shoulder pain for about 1 month.

## 2016-08-16 NOTE — ED Notes (Signed)
Will have benadryl at 1945 1 hour before scan. Pt aware. NAD. Family remains in room.

## 2016-08-16 NOTE — ED Notes (Signed)
Report called to Sharla Kidney

## 2016-08-16 NOTE — ED Notes (Signed)
Patient transported to X-ray 

## 2016-08-16 NOTE — ED Provider Notes (Signed)
[facility] Emergency Department Provider Note    First MD Initiated Contact with Patient 08/16/16 1411     (approximate)  I have reviewed the triage vital signs and the nursing notes.   HISTORY  Chief Complaint Chest Pain    HPI KADIJAH SHAMOON is a 76 y.o. female multiple comorbidities presents with chief complaint of midsternal chest pain that started this morning when she woke from sleep last roughly 10-15 minutes. States the pain did radiate down her left arm but states that the left arm pain has been chronic for the past several months. Denies any nausea or vomiting. States that since then she still feels that her chest is "tight."  She denies any worsening lower extremity swelling. Denies any numbness or tingling. No pain radiating through to her back. No fevers. Coronary family members at bedside the patient has not been eating well.   Past Medical History:  Diagnosis Date  . Anxiety state, unspecified   . Arthropathy, unspecified, site unspecified   . Benign carcinoid tumor of the duodenum   . Blindness of one eye   . Breast cancer ([place]) 2009  . Cancer [facility])    breast  . Colon cancer ([place])   . Depressive disorder, not elsewhere classified   . Diarrhea   . Disease of gallbladder   . Esophageal cancer ([place])   . GERD (gastroesophageal reflux disease)   . Headache(784.0)   . Hyperlipidemia   . Hypertension   . Kidney stones   . OA (osteoarthritis) of knee    right  . Personal history of radiation therapy   . Syncope and collapse   . Unspecified ectopic pregnancy without intrauterine pregnancy    Family History  Problem Relation Age of Onset  . Cancer Mother     unknown type  . Heart attack Brother   . Diabetes Daughter   . Breast cancer Neg Hx    Past Surgical History:  Procedure Laterality Date  . BREAST BIOPSY Bilateral   . BREAST EXCISIONAL BIOPSY Right 2009   (+)  . BREAST SURGERY Right   . CHOLECYSTECTOMY  05-27-12  .  COLON RESECTION    . COLONOSCOPY    . PARTIAL HYSTERECTOMY    . TOTAL KNEE ARTHROPLASTY Right    Patient Active Problem List   Diagnosis Date Noted  . TBI (traumatic brain injury) ([place]) 02/29/2016  . 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 Memory loss 02/13/2016  . Chronic headache 02/13/2016  . Snoring 02/13/2016  . Daytime somnolence 02/13/2016  . Bradycardia 03/11/2014  . Preop cardiovascular exam 05/22/2012  . SYNCOPE AND COLLAPSE 07/13/2010  . Hyperlipidemia 03/29/2010  . Chest pain 10/20/2009  . Nonspecific (abnormal) findings on radiological and other examination of gastrointestinal tract 11/12/2007  . BENIGN CARCINOID TUMOR OF THE DUODENUM 11/11/2007  . ANXIETY 11/11/2007  . DEPRESSION 11/11/2007  . Essential hypertension 11/11/2007  . ECTOPIC PREGNANCY 11/11/2007  . ARTHRITIS 11/11/2007  . HEADACHE 11/11/2007  . DIARRHEA, CHRONIC 11/11/2007      Prior to Admission medications   Medication Sig Start Date End Date Taking? Authorizing Provider  amLODipine (NORVASC) 10 MG tablet Take 10 mg by mouth daily.    Historical Provider, MD  aspirin 81 MG EC tablet Take 81 mg by mouth daily.     Historical Provider, MD  Brimonidine Tartrate-Timolol (COMBIGAN OP) Place 1 drop into both eyes 2 (two) times daily.     Historical Provider, MD  clonazePAM (KLONOPIN) 1 MG tablet Take 1 mg by mouth 2 (two) times daily as needed. Takes 1 to 1 1/2 tablets daily as needed.    Historical Provider, MD  Cyanocobalamin (VITAMIN B 12 PO) Take 1,000 mcg by mouth daily.    Historical Provider, MD  dorzolamide (TRUSOPT) 2 % ophthalmic solution Place 1 drop into both eyes 2 (two) times daily.    Historical Provider, MD  ibuprofen (ADVIL,MOTRIN) 200 MG tablet Take 200 mg by mouth every 6 (six) hours as needed.    Historical Provider, MD  latanoprost (XALATAN) 0.005 % ophthalmic solution Place 1 drop into both eyes at bedtime.    Historical Provider, MD    Loperamide HCl (ANTI-DIARRHEAL PO) Take by mouth. As needed     Historical Provider, MD  naproxen (NAPROSYN) 500 MG tablet Take 500 mg by mouth 2 (two) times daily with a meal.    Historical Provider, MD  omeprazole (PRILOSEC) 40 MG capsule Take 40 mg by mouth daily.    Historical Provider, MD  sertraline (ZOLOFT) 100 MG tablet Take 100 mg by mouth daily. 1.5 tablets po daily for depression    Historical Provider, MD  simvastatin (ZOCOR) 20 MG tablet Take 20 mg by mouth daily.    Historical Provider, MD    Allergies Iohexol and Morphine    Social History Social History  Substance Use Topics  . Smoking status: Never Smoker  . Smokeless tobacco: Never Used  . Alcohol use No    Review of Systems Patient denies headaches, rhinorrhea, blurry vision, numbness, shortness of breath, chest pain, edema, cough, abdominal pain, nausea, vomiting, diarrhea, dysuria, fevers, rashes or hallucinations unless otherwise stated above in HPI. ____________________________________________   PHYSICAL EXAM:  VITAL SIGNS: Vitals:   08/16/16 1144  BP: (!) 133/95  Pulse: 69  Resp: 16  Temp: 98.7 F (37.1 C)    Constitutional: Alert and oriented. Well appearing and in no acute distress. Eyes: Conjunctivae are normal. PERRL. EOMI. Head: Atraumatic. Nose: No congestion/rhinnorhea. Mouth/Throat: Mucous membranes are moist.  Oropharynx non-erythematous. Neck: No stridor. Painless ROM. No cervical spine tenderness to palpation Hematological/Lymphatic/Immunilogical: No cervical lymphadenopathy. Cardiovascular: Normal rate, regular rhythm. Grossly normal heart sounds.  Good peripheral circulation. Respiratory: Normal respiratory effort.  No retractions. Lungs CTAB. Gastrointestinal: Soft and nontender. No distention. No abdominal bruits. No CVA tenderness. Genitourinary:  Musculoskeletal: No lower extremity tenderness nor edema.  No joint effusions. Neurologic:  Normal speech and language. No gross  focal neurologic deficits are appreciated. No gait instability. Skin:  Skin is warm, dry and intact. No rash noted. Psychiatric: Mood and affect are normal. Speech and behavior are normal.  ____________________________________________   LABS (all labs ordered are listed, but only abnormal results are displayed)  Results for orders placed or performed during the hospital encounter of 08/16/16 (from the past 24 hour(s))  Basic metabolic panel     Status: Abnormal   Collection Time: 08/16/16 11:51 AM  Result Value Ref Range   Sodium 139 135 - 145 mmol/L   Potassium 3.3 (L) 3.5 - 5.1 mmol/L   Chloride 107 101 - 111 mmol/L   CO2 25 22 - 32 mmol/L   Glucose, Bld 108 (H) 65 - 99 mg/dL   BUN 10 6 - 20 mg/dL   Creatinine, Ser 0.70 0.44 - 1.00 mg/dL   Calcium 9.3 8.9 - 10.3 mg/dL   GFR calc non Af Amer >60 >60 mL/min   GFR calc Af Amer >60 >60 mL/min   Anion gap  7 5 - 15  CBC     Status: Abnormal   Collection Time: 08/16/16 11:51 AM  Result Value Ref Range   WBC 5.0 3.6 - 11.0 K/uL   RBC 4.30 3.80 - 5.20 MIL/uL   Hemoglobin 13.1 12.0 - 16.0 g/dL   HCT 38.5 35.0 - 47.0 %   MCV 89.4 80.0 - 100.0 fL   MCH 30.5 26.0 - 34.0 pg   MCHC 34.1 32.0 - 36.0 g/dL   RDW 15.7 (H) 11.5 - 14.5 %   Platelets 208 150 - 440 K/uL  Troponin I     Status: None   Collection Time: 08/16/16 11:51 AM  Result Value Ref Range   Troponin I <0.03 <0.03 ng/mL   ____________________________________________  EKG My review and personal interpretation at Time: 10:39   Indication: chest pain  Rate: 65  Rhythm: sinus Axis: normal Other: no stemi, no depresions ____________________________________________  RADIOLOGY  I personally reviewed all radiographic images ordered to evaluate for the above acute complaints and reviewed radiology reports and findings.  These findings were personally discussed with the patient.  Please see medical record for radiology  report.  ____________________________________________   PROCEDURES  Procedure(s) performed:  Procedures    Critical Care performed: no ____________________________________________   INITIAL IMPRESSION / ASSESSMENT AND PLAN / ED COURSE  Pertinent labs & imaging results that were available during my care of the patient were reviewed by me and considered in my medical decision making (see chart for details).  DDX: ACS, pericarditis, esophagitis, boerhaaves, pe, dissection, pna, bronchitis, costochondritis   Jailene Cupit Ury is a 76 y.o. who presents to the ED with chest pain as described above. Possible component of unstable angina, EKG and initial troponin without any significant evidence of ischemia. No evidence of heart failure on exam. Patient is low risk by well's. We'll further risk stratify with d-dimer. Patient did have some improvement in symptoms after nitroglycerin which is further concerning for anginal component.  Clinical Course as of Aug 17 2222  Wed Aug 16, 2016  1629 D-dimer elevated.  Will order CT chest to further risk stratify.  Patient will require pre-medication prior to contrast load for CT scan. Based on her high risk presentation do feel patient will then if it from expedited cardiac workup in the hospital. I discussed case with Dr. Darvin Neighbours who agrees to admit patient for further eval and management.    [PR]    Clinical Course User Index [PR] Merlyn Lot, MD     ____________________________________________   FINAL CLINICAL IMPRESSION(S) / ED DIAGNOSES  Final diagnoses:  Chest pain, unspecified type      NEW MEDICATIONS STARTED DURING THIS VISIT:  New Prescriptions   No medications on file     Note:  This document was prepared using Dragon voice recognition software and may include unintentional dictation errors.    Merlyn Lot, MD 08/16/16 2226

## 2016-08-16 NOTE — ED Notes (Signed)
Blue top drawn from iv and sent to lab.  Pt denies any pain now.   Family at bedside.

## 2016-08-17 LAB — TROPONIN I: Troponin I: <0.03 ng/mL (ref <0.03)

## 2016-08-17 MED ORDER — SIMVASTATIN 20 MG PO TABS
20.0000 mg | ORAL_TABLET | Freq: Every day | ORAL | Status: DC
  Administered 2016-08-17: 20 mg via ORAL
  Filled 2016-08-17: qty 1

## 2016-08-17 MED ORDER — SERTRALINE HCL 50 MG PO TABS
150.0000 mg | ORAL_TABLET | Freq: Every day | ORAL | Status: DC
  Administered 2016-08-17: 150 mg via ORAL
  Filled 2016-08-17: qty 1

## 2016-08-17 MED ORDER — BRIMONIDINE TARTRATE-TIMOLOL 0.2-0.5 % OP SOLN: 1.0000 [drp] | Freq: Two times a day (BID) | OPHTHALMIC | Status: DC

## 2016-08-17 MED ORDER — AMLODIPINE BESYLATE 10 MG PO TABS
10.0000 mg | ORAL_TABLET | Freq: Every day | ORAL | Status: DC
  Administered 2016-08-17: 10 mg via ORAL
  Filled 2016-08-17: qty 1

## 2016-08-17 MED ORDER — OMEPRAZOLE 40 MG PO CPDR: 40.0000 mg | DELAYED_RELEASE_CAPSULE | Freq: Every day | ORAL | 0 refills | Status: DC

## 2016-08-17 MED ORDER — NAPROXEN 250 MG PO TABS
500.0000 mg | ORAL_TABLET | Freq: Every day | ORAL | Status: DC
  Administered 2016-08-17: 500 mg via ORAL
  Filled 2016-08-17: qty 2

## 2016-08-17 MED ORDER — ASPIRIN EC 81 MG PO TBEC
81.0000 mg | DELAYED_RELEASE_TABLET | Freq: Every day | ORAL | Status: DC
  Administered 2016-08-17: 81 mg via ORAL

## 2016-08-17 MED ORDER — DORZOLAMIDE HCL 2 % OP SOLN
1.0000 [drp] | Freq: Two times a day (BID) | OPHTHALMIC | Status: DC
  Administered 2016-08-17: 1 [drp] via OPHTHALMIC
  Filled 2016-08-17: qty 10

## 2016-08-17 MED ORDER — TIMOLOL MALEATE 0.5 % OP SOLN
1.0000 [drp] | Freq: Two times a day (BID) | OPHTHALMIC | Status: DC
  Filled 2016-08-17: qty 5

## 2016-08-17 MED ORDER — PANTOPRAZOLE SODIUM 40 MG PO TBEC
40.0000 mg | DELAYED_RELEASE_TABLET | Freq: Every day | ORAL | Status: DC
  Administered 2016-08-17: 40 mg via ORAL
  Filled 2016-08-17: qty 1

## 2016-08-17 MED ORDER — CLONAZEPAM 1 MG PO TABS: 1.0000 mg | ORAL_TABLET | Freq: Two times a day (BID) | ORAL | Status: DC | PRN

## 2016-08-17 MED ORDER — LOPERAMIDE HCL 2 MG PO CAPS
2.0000 mg | ORAL_CAPSULE | ORAL | Status: DC | PRN
Start: 2016-08-17 — End: 2016-08-17

## 2016-08-17 MED ORDER — POTASSIUM CHLORIDE CRYS ER 10 MEQ PO TBCR
10.0000 meq | EXTENDED_RELEASE_TABLET | Freq: Every day | ORAL | Status: DC
  Administered 2016-08-17: 10 meq via ORAL
  Filled 2016-08-17: qty 1

## 2016-08-17 MED ORDER — BRIMONIDINE TARTRATE 0.2 % OP SOLN
1.0000 [drp] | Freq: Two times a day (BID) | OPHTHALMIC | Status: DC
  Administered 2016-08-17: 1 [drp] via OPHTHALMIC
  Filled 2016-08-17: qty 5

## 2016-08-17 NOTE — Progress Notes (Signed)
Patient being discharged to home with assistance from her granddaughter.  She has been taking Prilosec only when she has Gerd symptoms.  Stressed to her that she needs to take it daily to prevent damage to her esophagus.

## 2016-08-17 NOTE — Discharge Summary (Signed)
[facility] [facility] [place] NAME: [name]    MR#:  [id number]  DATE OF BIRTH:  [birth year]  DATE OF ADMISSION:  08/16/2016 ADMITTING PHYSICIAN: Hillary Bow, MD  DATE OF DISCHARGE: 08/17/2016 12:00 PM  PRIMARY CARE PHYSICIAN: Baltazar Apo, MD   ADMISSION DIAGNOSIS:  Chest pain, unspecified type [R07.9]  DISCHARGE DIAGNOSIS:  Active Problems:   Chest pain   SECONDARY DIAGNOSIS:   Past Medical History:  Diagnosis Date  . Anxiety state, unspecified   . Arthropathy, unspecified, site unspecified   . Benign carcinoid tumor of the duodenum   . Blindness of one eye   . Breast cancer ([place]) 2009  . Cancer [facility])    breast  . Colon cancer ([place])   . Depressive disorder, not elsewhere classified   . Diarrhea   . Disease of gallbladder   . Esophageal cancer ([place])   . GERD (gastroesophageal reflux disease)   . Headache(784.0)   . Hyperlipidemia   . Hypertension   . Kidney stones   . OA (osteoarthritis) of knee    right  . Personal history of radiation therapy   . Syncope and collapse   . Unspecified ectopic pregnancy without intrauterine pregnancy      ADMITTING HISTORY  HISTORY OF PRESENT ILLNESS:  [name]  is a 76 y.o. female with a known history of Hypertension, anxiety, chronic diarrhea presents to the emergency room with acute onset cough midsternal chest tightness early in the morning. Patient has chronic left shoulder pain which is unchanged. This chest tightness radiated into the left arm. She is not sure if the pain radiating in the left arm is her chronic pain or from the chest tightness. She received nitroglycerin with no response. But slowly the pain improved while she was in the emergency room. Here she has been found to have elevated d-dimer and a CTA of the chest has been ordered. But she does have contrast allergy has received hydrocortisone and is waiting for her Benadryl shot prior to getting the CT scan. Here  her troponin and EKG are normal. She follows with Dr. Rockey Situ as outpatient. Last saw him in 2017.    HOSPITAL COURSE:   * Chest pain Patient had atypical symptoms on presentation. But due to her risk factors patient was admitted onto telemetry floor. Troponin 3 remained normal. EKG showed no acute changes. No arrhythmias on telemetry. Due to elevation in d-dimer and a CT of the chest was done which showed no pulmonary embolism. It showed a large hiatal hernia. 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 Patient's chest pain is most likely due to reflux from her hiatal hernia. She was using her Prilosec only as needed. I have given her a prescription and asked her to take this twice a day for 15 days and then daily.  Stable for discharge home.  CONSULTS OBTAINED:    DRUG ALLERGIES:   Allergies  Allergen Reactions  . Iohexol      Code: HIVES, Desc: PTS GRANDDAUGHTER [name] CALLED 12/31/08 TO NOTIFY us THAT HER GRANDMOTHER HAD A REACTION TO THE IV DYE 8 HRS POST CT EXAM PT DEVELOPED HIVES/RASH.12/31/08/RM, Onset Date: [id number]   . Morphine     DISCHARGE MEDICATIONS:   Discharge Medication List as of 08/17/2016 11:38 AM    CONTINUE these medications which have CHANGED   Details  omeprazole (PRILOSEC) 40 MG capsule Take 1 capsule (40 mg total) by mouth daily.,  Starting Thu 08/17/2016, Normal      CONTINUE these medications which have NOT CHANGED   Details  amLODipine (NORVASC) 10 MG tablet Take 10 mg by mouth daily., Historical Med    aspirin 81 MG EC tablet Take 81 mg by mouth daily. , Historical Med    Brimonidine Tartrate-Timolol (COMBIGAN OP) Place 1 drop into both eyes 2 (two) times daily. , Historical Med    clonazePAM (KLONOPIN) 1 MG tablet Take 1 mg by mouth 2 (two) times daily as needed. Takes 1 to 1 1/2 tablets daily as needed., Historical Med    Cyanocobalamin (VITAMIN B 12 PO) Take 1,000 mcg by mouth daily., Historical Med    dorzolamide  (TRUSOPT) 2 % ophthalmic solution Place 1 drop into both eyes 2 (two) times daily., Historical Med    latanoprost (XALATAN) 0.005 % ophthalmic solution Place 1 drop into both eyes at bedtime., Historical Med    Loperamide HCl (ANTI-DIARRHEAL PO) Take by mouth. As needed , Historical Med    naproxen (NAPROSYN) 500 MG tablet Take 500 mg by mouth daily. , Historical Med    potassium chloride (K-DUR,KLOR-CON) 10 MEQ tablet Take 10 mEq by mouth daily., Historical Med    sertraline (ZOLOFT) 100 MG tablet Take 150 mg by mouth daily. , Historical Med    simvastatin (ZOCOR) 20 MG tablet Take 20 mg by mouth daily., Historical Med      STOP taking these medications     ibuprofen (ADVIL,MOTRIN) 200 MG tablet         Today   VITAL SIGNS:  Blood pressure 130/80, pulse 63, temperature 98.4 F (36.9 C), temperature source Oral, resp. rate (!) 22, height 5\' 7"  (1.702 m), weight 75.6 kg (166 lb 11.2 oz), SpO2 97 %.  I/O:   Intake/Output Summary (Last 24 hours) at 08/17/16 1328 Last data filed at 08/17/16 1009  Gross per 24 hour  Intake              240 ml  Output              200 ml  Net               40 ml    PHYSICAL EXAMINATION:  Physical Exam  GENERAL:  76 y.o.-year-old patient lying in the bed with no acute distress.  LUNGS: Normal breath sounds bilaterally, no wheezing, rales,rhonchi or crepitation. No use of accessory muscles of respiration.  CARDIOVASCULAR: S1, S2 normal. No murmurs, rubs, or gallops.  ABDOMEN: Soft, non-tender, non-distended. Bowel sounds present. No organomegaly or mass.  NEUROLOGIC: Moves all 4 extremities. PSYCHIATRIC: The patient is alert and oriented x 3.  SKIN: No obvious rash, lesion, or ulcer.   DATA REVIEW:   CBC  Recent Labs Lab 08/16/16 1151  WBC 5.0  HGB 13.1  HCT 38.5  PLT 208    Chemistries   Recent Labs Lab 08/16/16 1151  NA 139  K 3.3*  CL 107  CO2 25  GLUCOSE 108*  BUN 10  CREATININE 0.70  CALCIUM 9.3  MG 1.8     Cardiac Enzymes  Recent Labs Lab 08/17/16 0153  TROPONINI <0.03    Microbiology Results  Results for orders placed or performed in visit on 11/30/10  Ova and parasite screen     Status: None   Collection Time: 11/30/10  8:38 AM  Result Value Ref Range Status   OP No Ova or Parasites Seen   Final  Stool culture  Status: None   Collection Time: 11/30/10  8:38 AM  Result Value Ref Range Status   Organism ID, Bacteria No Salmonella,Shigella,Campylobacter or Yersinia  Final   Organism ID, Bacteria isolated.  Final    RADIOLOGY:  Dg Chest 2 View  Result Date: 08/16/2016 CLINICAL DATA:  Chest pain.  Shoulder pain. EXAM: CHEST  2 VIEW COMPARISON:  05/17/2012. FINDINGS: Mediastinum hilar structures normal. Cardiomegaly with tortuous thoracic aorta again noted. No focal alveolar infiltrate. Basilar subsegmental atelectasis and/or scarring. Prominence sliding hiatal hernia. Thoracic spine scoliosis degenerative change. IMPRESSION: 1.  Stable cardiomegaly.  Stable tortuosity of the thoracic aorta. 2.  Stable bibasilar atelectasis and/or scarring. 3. Sliding hiatal hernia again noted . Electronically Signed   By: Marcello Moores  Register   On: 08/16/2016 15:08   Ct Angio Chest Pe W And/or Wo Contrast  Result Date: 08/16/2016 CLINICAL DATA:  76 year old female with acute pleuritic chest pain. EXAM: CT ANGIOGRAPHY CHEST WITH CONTRAST TECHNIQUE: Multidetector CT imaging of the chest was performed using the standard protocol during bolus administration of intravenous contrast. Multiplanar CT image reconstructions and MIPs were obtained to evaluate the vascular anatomy. CONTRAST:  75 cc Isovue 370 COMPARISON:  Chest radiograph dated 08/16/2016 and CT dated 03/25/2011 FINDINGS: Cardiovascular: There is stable cardiomegaly. No pericardial effusion. There is mild dilatation of the aortic arch measuring up to 4.2 cm similar to prior CT. The thoracic aorta is tortuous. There is mild calcified atherosclerotic  plaque. Evaluation of the aortic lumen is limited due to suboptimal opacification and timing of the contrast. There is no CT evidence of pulmonary embolism. Mediastinum/Nodes: No hilar or mediastinal adenopathy. There is a moderate size hiatal hernia. The esophagus is grossly unremarkable. No thyroid nodules identified. Lungs/Pleura: There are minimal bibasilar linear atelectasis/ scarring with mild bilateral lower lobe bronchiectatic changes. There is no focal consolidation, pleural effusion, or pneumothorax. The central airways are patent. Upper Abdomen: Cholecystectomy clips. Partially visualized structure at the cholecystectomy bed containing air may represent a loop of bowel versus a complex collection. Correlation with clinical exam is recommended. CT of the abdomen pelvis may provide better evaluation of the cholecystectomy bed. A diverticulum noted in the transverse colon. Partially visualized right adrenal low attenuating nodule as seen on the prior CT, possibly an adenoma. Musculoskeletal: Degenerative changes of the spine. No acute fracture. Review of the MIP images confirms the above findings. IMPRESSION: 1. No acute intrathoracic pathology. No CT evidence of pulmonary embolism. 2. 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 Recommend semi-annual imaging followup by CTA or MRA and referral to cardiothoracic surgery if not already obtained. This recommendation follows 2010 ACCF/AHA/AATS/ACR/ASA/SCA/SCAI/SIR/STS/SVM Guidelines for the Diagnosis and Management of Patients With Thoracic Aortic Disease. Circulation. 2010; 121: e266-e36 3. Stable cardiomegaly. 4. Moderate hiatal hernia. 5. Postsurgical changes of cholecystectomy. Partially visualized structure at the cholecystectomy bed may represent a portion of bowel or a complex collection. This area is only partially visualized and not well evaluated. CT of the abdomen pelvis may provide better evaluation if clinically  indicated. Electronically Signed   By: Anner Crete M.D.   On: 08/16/2016 21:21    Follow up with PCP in 1 week.  Management plans discussed with the patient, family and they are in agreement.  CODE STATUS:     Code Status Orders        Start     Ordered   08/16/16 1849  Full code  Continuous     08/16/16 1850  Code Status History    Date Active Date Inactive Code Status Order ID Comments User Context   This patient has a current code status but no historical code status.      TOTAL TIME TAKING CARE OF THIS PATIENT ON DAY OF DISCHARGE: more than 30 minutes.   Hillary Bow R M.D on 08/17/2016 at 1:28 PM  Between 7am to 6pm - Pager - [phone]  After 6pm go to [email or web address] - password [email or web address] [facility]  Office  [phone]  CC: Primary care physician; Baltazar Apo, MD  Note: This dictation was prepared with Dragon dictation along with smaller phrase technology. Any transcriptional errors that result from this process are unintentional.

## 2016-08-17 NOTE — Care Management Obs Status (Signed)
[place] NOTIFICATION   Patient Details  Name: [name] MRN: [id number] Date of Birth: [birth year]   Medicare Observation Status Notification Given:  Yes    Katrina Stack, RN 08/17/2016, 12:23 PM

## 2016-10-05 ENCOUNTER — Encounter (INDEPENDENT_AMBULATORY_CARE_PROVIDER_SITE_OTHER): Payer: Self-pay

## 2016-10-05 ENCOUNTER — Encounter: Payer: Self-pay | Admitting: Neurology

## 2016-10-05 ENCOUNTER — Ambulatory Visit (INDEPENDENT_AMBULATORY_CARE_PROVIDER_SITE_OTHER): Payer: Medicare Other | Admitting: Neurology

## 2016-10-05 VITALS — BP 135/89 | HR 71 | Wt 179.4 lb

## 2016-10-05 DIAGNOSIS — R531 Weakness: Secondary | ICD-10-CM | POA: Diagnosis not present

## 2016-10-05 DIAGNOSIS — W19XXXD Unspecified fall, subsequent encounter: Secondary | ICD-10-CM

## 2016-10-05 DIAGNOSIS — R269 Unspecified abnormalities of gait and mobility: Secondary | ICD-10-CM

## 2016-10-05 MED ORDER — MEMANTINE HCL 5 MG PO TABS
5.0000 mg | ORAL_TABLET | Freq: Every day | ORAL | 6 refills | Status: DC
Start: 2016-10-05 — End: 2017-12-03

## 2016-10-05 NOTE — Progress Notes (Signed)
[facility]    Provider:  Dr Jaynee Eagles Referring Provider: Denton Lank, MD Primary Care Physician:  Baltazar Apo, MD  CC: Memory loss  Interval history 10/05/2016: She still has headaches sometimes. She have headaches at least 3x a week.  Here with her granddaughter Aniceto Boss. She could not tolerate the Aricept. She refuses anymore medications. Could benefit from continued PT. Will send [facility] back. Wants [facility] same people that were there before.   Interval history 07/06/2016: Patient returns today. MMSE improved from 20/30 to 25/30. She wants to discuss a new problem headaches. She has had a few falls in her home. A toy was on the floor and she just fell on the floor but didn;t hit anything. Sleep study was negative. She has headaches. She has "high pressures in my eyes" and that is being treated. Headaches are in the frontal areas. She has them every day. Ibuprofen helps. She takes it every once in a while. She has been complaining of the headaches and dizziness more frequently. Daughter feels something is different. They are going to see the eye doctor and her primary care she feels she is lightheaded.  She has headaches when she gets up and the headaches are there tea helps. She has always had headaches and sometimes they are worse. She has had headaches all her life. She used to have migraines.She gets agitated. She does drinka  Lot of fluids.   Ward Givens 03/2016: HISTORY OF PRESENT ILLNESS: Ms. Pearlman is a 76 year old female with a history of confusional arousals, snoring and sleep disturbance. She returns today to discuss her ONOresults. I explained to the patient that Dr. Precious Reel reviewed her ONO study and reports that it is normal. She does not qualify for supplemental oxygen at night. Patient verbalized understanding. At the last visit Dr. Brett Fairy encouraged the patient to use melatonin before bedtime. Her granddaughters with her and reports that they forgot to pick  this up. They are willing to give this a try. She was also encouraged to participate in a senior citizen's group. She states that she's been going with a friend to a church group. At this group they do things such as watching movies and eating. Patient overall feels that she is doing okay. In the past she has been given Aricept however this caused vivid dreams. She is no longer on Aricept.   HPI 02/2016:Capri N Loveis a 76 y.o.femalehere as a referral from Dr. Ruthine Dose memory loss. Past medical history of hypertension, high cholesterol, migraine, depression, anxiety, breast cancer and colon cancer. Here with daughter and granddaughter who provide mostinformation. She loses things. She repeats herself. Started 2 years ago and worsening. She had a brother in his 63s maybe with memory loss but also had a stroke, no FHx of Alzheimer's dementia. More short-term memory loss. She forgets things and later remembers it like names. She forgets names and faces. She has forgotten things that they discussed multiple times over the years and patient does not remember. A lot of little things. She lives alone. She does not drive. Patient pays the bills but needs help now. She needs help getting her medications. She does not want to eat. Her husband just dies a year ago and they were married 50 years ago. She has had migraine headaches for years and she has had chronic headaches for years since daughter remembers and now they feel like daily pressure hedaches. Granddaughter thinks she is depressed. Says that patient will repeat things even if she just told  them earlier. She hides things and can;t find them. Headaches are in the morning when she wakes up every day. She snores badly per family. Headaches are daily she wakes with them every morning.   Reviewed MRi brainimages personally and with patient and family: Mild atrophy and chronic microvascular ischemic change. Tiny RIGHT thalamic focus chronic hemorrhage,  likely sequelae of hypertensive cerebrovascular disease.  No acute intracranial findings are evident.   Reviewed notes, labs and imaging from outside physicians, which showed:   Labs drawn 12/30/2015: CMP was unremarkable with creatinine 0.91, B12 was 1037, folate 13.2.   Her primary care notes, she has been depressed, flat affect sometimes tearful, doesn't want to talk to counselor may be in the future. Patient has headache on and off. She suspected tension headache. Tried stretching increasing fluids for hydration, may take Tylenol, MRI was ordered.  MRI of the brain (personally reviewed images 01/22/2016) and agree with the following: Mild atrophy and chronic microvascular ischemic change. Tiny RIGHT thalamic focus chronic hemorrhage, likely sequelae of hypertensive cerebrovascular disease.  No acute intracranial findings are evident. Review of Systems: Patient complains of symptoms per HPI as well as the following symptoms: Loss of vision, blurred vision, diarrhea, insomnia, daytime sleepiness, joint pain, aching muscles, walking difficulty, memory loss, dizziness, headache, weakness, agitation, depression, nervousness Pertinent negatives per HPI. All others negative.   Social History   Social History  . Marital status: Widowed    Spouse name: N/A  . Number of children: N/A  . Years of education: N/A   Occupational History  . Retired    Social History Main Topics  . Smoking status: Never Smoker  . Smokeless tobacco: Never Used  . Alcohol use No  . Drug use: No  . Sexual activity: Not on file   Other Topics Concern  . Not on file   Social History Narrative   Lives alone   Caffeine use: coffee/soda    Family History  Problem Relation Age of Onset  . Cancer Mother     unknown type  . Heart attack Brother   . Diabetes Daughter   . Breast cancer Neg Hx     Past Medical History:  Diagnosis Date  . Anxiety state, unspecified   . Arthropathy,  unspecified, site unspecified   . Benign carcinoid tumor of the duodenum   . Blindness of one eye   . Breast cancer ([place]) 2009  . Cancer [facility])    breast  . Colon cancer ([place])   . Depressive disorder, not elsewhere classified   . Diarrhea   . Disease of gallbladder   . Esophageal cancer ([place])   . GERD (gastroesophageal reflux disease)   . Headache(784.0)   . Hyperlipidemia   . Hypertension   . Kidney stones   . OA (osteoarthritis) of knee    right  . Personal history of radiation therapy   . Syncope and collapse   . Unspecified ectopic pregnancy without intrauterine pregnancy     Past Surgical History:  Procedure Laterality Date  . BREAST BIOPSY Bilateral   . BREAST EXCISIONAL BIOPSY Right 2009   (+)  . BREAST SURGERY Right   . CHOLECYSTECTOMY  05-27-12  . COLON RESECTION    . COLONOSCOPY    . PARTIAL HYSTERECTOMY    . TOTAL KNEE ARTHROPLASTY Right     Current Outpatient Prescriptions  Medication Sig Dispense Refill  . amLODipine (NORVASC) 10 MG tablet Take 10 mg by mouth daily.    Marland Kitchen aspirin 81  MG EC tablet Take 81 mg by mouth daily.     . Brimonidine Tartrate-Timolol (COMBIGAN OP) Place 1 drop into both eyes 2 (two) times daily.     . clonazePAM (KLONOPIN) 1 MG tablet Take 1 mg by mouth 2 (two) times daily as needed. Takes 1 to 1 1/2 tablets daily as needed.    . Cyanocobalamin (VITAMIN B 12 PO) Take 1,000 mcg by mouth daily.    . dorzolamide (TRUSOPT) 2 % ophthalmic solution Place 1 drop into both eyes 2 (two) times daily.    Marland Kitchen latanoprost (XALATAN) 0.005 % ophthalmic solution Place 1 drop into both eyes at bedtime.    . Loperamide HCl (ANTI-DIARRHEAL PO) Take by mouth. As needed     . naproxen (NAPROSYN) 500 MG tablet Take 500 mg by mouth daily.     Marland Kitchen omeprazole (PRILOSEC) 40 MG capsule Take 1 capsule (40 mg total) by mouth daily. 60 capsule 0  . potassium chloride (K-DUR,KLOR-CON) 10 MEQ tablet Take 10 mEq by mouth daily.    . sertraline (ZOLOFT) 100 MG tablet Take  150 mg by mouth daily.     . simvastatin (ZOCOR) 20 MG tablet Take 20 mg by mouth daily.     No current facility-administered medications for this visit.     Allergies as of 10/05/2016 - Review Complete 10/05/2016  Allergen Reaction Noted  . Iohexol  12/31/2008  . Morphine      Vitals: BP 135/89   Pulse 71   Wt 179 lb 6.4 oz (81.4 kg)   BMI 28.10 kg/m  Last Weight:  Wt Readings from Last 1 Encounters:  10/05/16 179 lb 6.4 oz (81.4 kg)   Last Height:   Ht Readings from Last 1 Encounters:  08/16/16 5\' 7"  (1.702 m)   MMSE - Mini Mental State Exam 07/06/2016 02/09/2016  Orientation to time 5 4  Orientation to Place 4 4  Registration 3 3  Attention/ Calculation 2 0  Recall 3 2  Language- name 2 objects 2 2  Language- repeat 1 1  Language- follow 3 step command 3 2  Language- read & follow direction 1 1  Write a sentence 1 1  Copy design 0 0  Total score 25 20    Cranial Nerves: The pupils are equal, round, and reactive to light. Attempted funduscopic exam could not visualize due to small pupils.Visual fields are full to finger confrontation. Extraocular movements are intact. Trigeminal sensation is intact and the muscles of mastication are normal. The face is symmetric. The palate elevates in the midline. Hearing intact. Voice is normal. Shoulder shrug is normal. The tongue has normal motion without fasciculations.   Coordination: No dysmetria  Gait: slow, imbalance  Motor Observation: No asymmetry, no atrophy, and no involuntary movements noted. Tone: Normal muscle tone.   Posture: Posture is normal. normal erect  Strength: Strength is 4/V in the upper and lower limbs.   Sensation: intact to LT  Reflex Exam:  DTR's: Deep tendon reflexes in the upper and lower extremities are brisk for age bilaterally.  Toes: The toes are downgoing bilaterally.  Clonus: Clonus is absent.    Assessment/Plan:76 year old  patient with memory loss, chronic headaches, thalamic hemorrhage.  Tiny RIGHT thalamic focus chronic hemorrhage, likely sequelae of hypertensive cerebrovascular disease. Monitor blood pressure  Snoring, memory loss, daytime somnolence, morning headache: sleep eval was negative  As far as your medications are concerned, I would like to suggest: Declines any further medications. Home physical therapy with nursing and  a home safety study  [place] for nursing for medication management, PT for gait and safety for repeated falls and weakness. Here with Campbell Riches. [name]'s number is [phone].   As far as your medications are concerned, I would like to suggest: Start Namenda I would like to see you back in 6 months, sooner if we need to. Please call us with any interim questions, concerns, problems, updates or refill requests.   Our phone number is [phone]. We also have an after hours call service for urgent matters and there is a physician on-call for urgent questions. For any emergencies you know to call 911 or go to the nearest emergency room  Chronic headaches: Namenda may help, it is used in migraine and headache management. Discussed the following:  To prevent or relieve headaches, try the following:  Cool Compress.Lie down and place a cool compress on your head.   Avoid headache triggers.If certain foods or odors seem to have triggered your migraines in the past, avoid them. A headache diary might help you identify triggers.   Include physical activity in your daily routine.Try a daily walk or other moderate aerobic exercise.   Manage stress.Find healthy ways to cope with the stressors, such as delegating tasks on your to-do list.   Practice relaxation techniques.Try deep breathing, yoga, massage and visualization.   Eat regularly.Eating regularly scheduled meals and maintaining a healthy diet might help prevent headaches. Also, drink plenty of fluids.     Follow a regular sleep schedule.Sleep deprivation might contribute to headaches  Consider biofeedback.With this mind-body technique, you learn to control certain bodily functions -such as muscle tension, heart rate and blood pressure -to prevent headaches or reduce headache pain.    Proceed to emergency room if you experience new or worsening symptoms or symptoms do not resolve, if you have new neurologic symptoms or if headache is severe, or for any concerning symptom.  Sarina Ill, MD  [facility] [address] [place], [place] [address]  Phone [phone] Fax [phone]  A total of 25 minutes was spent face-to-face with this patient. Over half this time was spent on counseling patient on the MCI, gait abnormality diagnosis and different diagnostic and therapeutic options available.

## 2016-10-05 NOTE — Patient Instructions (Signed)
Remember to drink plenty of fluid, eat healthy meals and do not skip any meals. Try to eat protein with a every meal and eat a healthy snack such as fruit or nuts in between meals. Try to keep a regular sleep-wake schedule and try to exercise daily, particularly in the form of walking, 20-30 minutes a day, if you can.   As far as your medications are concerned, I would like to suggest: Memantine(Namenda) 5mg  at bedtime  As far as diagnostic testing: [name]  I would like to see you back in 6-9 months, sooner if we need to. Please call us with any interim questions, concerns, problems, updates or refill requests.   Our phone number is [phone]. We also have an after hours call service for urgent matters and there is a physician on-call for urgent questions. For any emergencies you know to call 911 or go to the nearest emergency room  Memantine Tablets What is this medicine? MEMANTINE (MEM an teen) is used to treat dementia caused by Alzheimer's disease. This medicine may be used for other purposes; ask your health care provider or pharmacist if you have questions. COMMON BRAND NAME(S): Namenda What should I tell my health care provider before I take this medicine? They need to know if you have any of these conditions: -difficulty passing urine -kidney disease -liver disease -seizures -an unusual or allergic reaction to memantine, other medicines, foods, dyes, or preservatives -pregnant or trying to get pregnant -breast-feeding How should I use this medicine? Take this medicine by mouth with a glass of water. Follow the directions on the prescription label. You may take this medicine with or without food. Take your doses at regular intervals. Do not take your medicine more often than directed. Continue to take your medicine even if you feel better. Do not stop taking except on the advice of your doctor or health care professional. Talk to your pediatrician regarding the use of this  medicine in children. Special care may be needed. Overdosage: If you think you have taken too much of this medicine contact a poison control center or emergency room at once. NOTE: This medicine is only for you. Do not share this medicine with others. What if I miss a dose? If you miss a dose, take it as soon as you can. If it is almost time for your next dose, take only that dose. Do not take double or extra doses. If you do not take your medicine for several days, contact your health care provider. Your dose may need to be changed. What may interact with this medicine? -acetazolamide -amantadine -cimetidine -dextromethorphan -dofetilide -hydrochlorothiazide -ketamine -metformin -methazolamide -quinidine -ranitidine -sodium bicarbonate -triamterene This list may not describe all possible interactions. Give your health care provider a list of all the medicines, herbs, non-prescription drugs, or dietary supplements you use. Also tell them if you smoke, drink alcohol, or use illegal drugs. Some items may interact with your medicine. What should I watch for while using this medicine? Visit your doctor or health care professional for regular checks on your progress. Check with your doctor or health care professional if there is no improvement in your symptoms or if they get worse. You may get drowsy or dizzy. Do not drive, use machinery, or do anything that needs mental alertness until you know how this drug affects you. Do not stand or sit up quickly, especially if you are an older patient. This reduces the risk of dizzy or fainting spells. Alcohol can make you  more drowsy and dizzy. Avoid alcoholic drinks. What side effects may I notice from receiving this medicine? Side effects that you should report to your doctor or health care professional as soon as possible: -allergic reactions like skin rash, itching or hives, swelling of the face, lips, or tongue -agitation or a feeling of  restlessness -depressed mood -dizziness -hallucinations -redness, blistering, peeling or loosening of the skin, including inside the mouth -seizures -vomiting Side effects that usually do not require medical attention (report to your doctor or health care professional if they continue or are bothersome): -constipation -diarrhea -headache -nausea -trouble sleeping This list may not describe all possible side effects. Call your doctor for medical advice about side effects. You may report side effects to FDA at 1-800-FDA-1088. Where should I keep my medicine? Keep out of the reach of children. Store at room temperature between 15 degrees and 30 degrees C (59 degrees and 86 degrees F). Throw away any unused medicine after the expiration date. NOTE: This sheet is a summary. It may not cover all possible information. If you have questions about this medicine, talk to your doctor, pharmacist, or health care provider.  2018 Elsevier/Gold Standard (2013-03-10 14:10:42)

## 2016-10-12 ENCOUNTER — Telehealth: Payer: Self-pay | Admitting: Neurology

## 2016-10-12 NOTE — Telephone Encounter (Signed)
Returned call to [place], PT w/ [facility]. VO given for visits x 6. Verbalized understanding and appreciation for call.

## 2016-10-12 NOTE — Telephone Encounter (Signed)
Lemuel Physical Therapist from [facility] [place] [phone] is asking for a call back

## 2016-10-18 NOTE — Telephone Encounter (Signed)
Lemuel with [facility] called office stating he was to do visit today with patient, but patient refused due to not feeling well due to migraines.

## 2016-11-20 NOTE — Telephone Encounter (Signed)
Received PT notes from [facility]. Pt missed visit 10/18/16 d/t migraine. Pt last seen by PT on 11/02/16. Sent to med records for scanning, copy to Dr. Jaynee Eagles for review.

## 2017-01-31 ENCOUNTER — Encounter: Payer: Self-pay | Admitting: Neurology

## 2017-02-06 ENCOUNTER — Encounter: Payer: Self-pay | Admitting: Adult Health

## 2017-02-06 ENCOUNTER — Ambulatory Visit (INDEPENDENT_AMBULATORY_CARE_PROVIDER_SITE_OTHER): Payer: Medicare Other | Admitting: Adult Health

## 2017-02-06 DIAGNOSIS — R51 Headache: Secondary | ICD-10-CM | POA: Diagnosis not present

## 2017-02-06 DIAGNOSIS — R519 Headache, unspecified: Secondary | ICD-10-CM

## 2017-02-06 NOTE — Patient Instructions (Signed)
Your Plan:  MRI brain Pending those results we will consider medication for the headaches     Thank you for coming to see [place] at [facility]. I hope we have been able to provide you high quality care today.  You may receive a patient satisfaction survey over the next few weeks. We would appreciate your feedback and comments so that we may continue to improve ourselves and the health of our patients.

## 2017-02-06 NOTE — Progress Notes (Signed)
PATIENT: [name] DOB: 1941-03-21  REASON FOR VISIT: follow up HISTORY FROM: patient  HISTORY OF PRESENT ILLNESS: Today 02/06/17 Ms. [name] is a 76 year old female with a history of headaches. She returns today for follow-up. She states that over the last several weeks her headaches have become more severe. She states that she essentially has a daily headache though there are points in the day that her headache improves. She states at times she will feel dizzy. She reports occasionally she will have photophobia and phonophobia. If the headache becomes severe she may feel slightly nauseous. She felt that her headaches were coming from her eyedrops and potentially the pressure in her eyes due to glaucoma. However her granddaughter reports that her last visit her pressure was normal. Patient denies any additional symptoms such as numbness weakness in the arms and legs. No change with bowels or bladder. She returns today for an evaluation.  HISTORY Interval history 10/05/2016: She still has headaches sometimes. She have headaches at least 3x a week.  Here with her granddaughter Aniceto Boss. She could not tolerate the Aricept. She refuses anymore medications. Could benefit from continued PT. Will send [facility] back. Wants [facility] same people that were there before.   Interval history 07/06/2016: Patient returns today. MMSE improved from 20/30 to 25/30. She wants to discuss a new problem headaches. She has had a few falls in her home. A toy was on the floor and she just fell on the floor but didn;t hit anything. Sleep study was negative. She has headaches. She has "high pressures in my eyes" and that is being treated. Headaches are in the frontal areas. She has them every day. Ibuprofen helps. She takes it every once in a while. She has been complaining of the headaches and dizziness more frequently. Daughter feels something is different. They are going to see the eye doctor and her primary care she feels she  is lightheaded. She has headaches when she gets up and the headaches are there tea helps. She has always had headaches and sometimes they are worse. She has had headaches all her life. She used to have migraines.She gets agitated. She does drinka Lot of fluids.   Ward Givens 03/2016: HISTORY OF PRESENT ILLNESS: [name] is a 76 year old female with a history of confusional arousals, snoring and sleep disturbance. She returns today to discuss her ONOresults. I explained to the patient that Dr. Precious Reel reviewed her ONO study and reports that it is normal. She does not qualify for supplemental oxygen at night. Patient verbalized understanding. At the last visit Dr. Brett Fairy encouraged the patient to use melatonin before bedtime. Her granddaughters with her and reports that they forgot to pick this up. They are willing to give this a try. She was also encouraged to participate in a senior citizen's group. She states that she's been going with a friend to a church group. At this group they do things such as watching movies and eating. Patient overall feels that she is doing okay. In the past she has been given Aricept however this caused vivid dreams. She is no longer on Aricept.   HPI 02/2016:Allison N Loveis a 76 y.o.femalehere as a referral from Dr. Ruthine Dose memory loss. Past medical history of hypertension, high cholesterol, migraine, depression, anxiety, breast cancer and colon cancer. Here with daughter and granddaughter who provide mostinformation. She loses things. She repeats herself. Started 2 years ago and worsening. She had a brother in his 28s maybe with memory loss but  also had a stroke, no FHx of Alzheimer's dementia. More short-term memory loss. She forgets things and later remembers it like names. She forgets names and faces. She has forgotten things that they discussed multiple times over the years and patient does not remember. A lot of little things. She lives alone. She  does not drive. Patient pays the bills but needs help now. She needs help getting her medications. She does not want to eat. Her husband just dies a year ago and they were married 50 years ago. She has had migraine headaches for years and she has had chronic headaches for years since daughter remembers and now they feel like daily pressure hedaches. Granddaughter thinks she is depressed. Says that patient will repeat things even if she just told them earlier. She hides things and can;t find them. Headaches are in the morning when she wakes up every day. She snores badly per family. Headaches are daily she wakes with them every morning.   Reviewed MRi brainimages personally and with patient and family: Mild atrophy and chronic microvascular ischemic change. Tiny RIGHT thalamic focus chronic hemorrhage, likely sequelae of hypertensive cerebrovascular disease.  No acute intracranial findings are evident.   Reviewed notes, labs and imaging from outside physicians, which showed:   Labs drawn 12/30/2015: CMP was unremarkable with creatinine 0.91, B12 was 1037, folate 13.2.   Her primary care notes, she has been depressed, flat affect sometimes tearful, doesn't want to talk to counselor may be in the future. Patient has headache on and off. She suspected tension headache. Tried stretching increasing fluids for hydration, may take Tylenol, MRI was ordered.  MRI of the brain (personally reviewed images 01/22/2016) and agree with the following: Mild atrophy and chronic microvascular ischemic change. Tiny RIGHT thalamic focus chronic hemorrhage, likely sequelae of hypertensive cerebrovascular disease.  No acute intracranial findings are evident.  REVIEW OF SYSTEMS: Out of a complete 14 system review of symptoms, the patient complains only of the following symptoms, and all other reviewed systems are negative.  ALLERGIES: Allergies  Allergen Reactions  . Iohexol      Code: HIVES, Desc: PTS  GRANDDAUGHTER [name] CALLED 12/31/08 TO NOTIFY us THAT HER GRANDMOTHER HAD A REACTION TO THE IV DYE 8 HRS POST CT EXAM PT DEVELOPED HIVES/RASH.12/31/08/RM, Onset Date: [id number]   . Morphine Other (See Comments)    confusion    HOME MEDICATIONS: Outpatient Medications Prior to Visit  Medication Sig Dispense Refill  . amLODipine (NORVASC) 10 MG tablet Take 10 mg by mouth daily.    . Brimonidine Tartrate-Timolol (COMBIGAN OP) Place 1 drop into both eyes 2 (two) times daily.     . clonazePAM (KLONOPIN) 1 MG tablet Take 1 mg by mouth 2 (two) times daily as needed. Takes 1 to 1 1/2 tablets daily as needed.    . Cyanocobalamin (VITAMIN B 12 PO) Take 1,000 mcg by mouth daily.    . dorzolamide (TRUSOPT) 2 % ophthalmic solution Place 1 drop into both eyes 2 (two) times daily.    Marland Kitchen latanoprost (XALATAN) 0.005 % ophthalmic solution Place 1 drop into both eyes at bedtime.    . memantine (NAMENDA) 5 MG tablet Take 1 tablet (5 mg total) by mouth daily. 30 tablet 6  . omeprazole (PRILOSEC) 40 MG capsule Take 1 capsule (40 mg total) by mouth daily. 60 capsule 0  . potassium chloride (K-DUR,KLOR-CON) 10 MEQ tablet Take 10 mEq by mouth daily.    . sertraline (ZOLOFT) 100 MG tablet Take 150 mg  by mouth daily.     . simvastatin (ZOCOR) 20 MG tablet Take 20 mg by mouth daily.    Marland Kitchen aspirin 81 MG EC tablet Take 81 mg by mouth daily.     . Loperamide HCl (ANTI-DIARRHEAL PO) Take by mouth. As needed     . naproxen (NAPROSYN) 500 MG tablet Take 500 mg by mouth daily.      No facility-administered medications prior to visit.     PAST MEDICAL HISTORY: Past Medical History:  Diagnosis Date  . Anxiety state, unspecified   . Arthropathy, unspecified, site unspecified   . Benign carcinoid tumor of the duodenum   . Blindness of one eye   . Breast cancer ([place]) 2009  . Cancer [facility])    breast  . Colon cancer ([place])   . Depressive disorder, not elsewhere classified   . Diarrhea   . Disease of gallbladder   .  Esophageal cancer ([place])   . GERD (gastroesophageal reflux disease)   . Headache(784.0)   . Hyperlipidemia   . Hypertension   . Kidney stones   . OA (osteoarthritis) of knee    right  . Personal history of radiation therapy   . Syncope and collapse   . Unspecified ectopic pregnancy without intrauterine pregnancy     PAST SURGICAL HISTORY: Past Surgical History:  Procedure Laterality Date  . BREAST BIOPSY Bilateral   . BREAST EXCISIONAL BIOPSY Right 2009   (+)  . BREAST SURGERY Right   . CHOLECYSTECTOMY  05-27-12  . COLON RESECTION    . COLONOSCOPY    . PARTIAL HYSTERECTOMY    . TOTAL KNEE ARTHROPLASTY Right     FAMILY HISTORY: Family History  Problem Relation Age of Onset  . Cancer Mother        unknown type  . Heart attack Brother   . Diabetes Daughter   . Breast cancer Neg Hx     SOCIAL HISTORY: Social History   Social History  . Marital status: Widowed    Spouse name: N/A  . Number of children: N/A  . Years of education: N/A   Occupational History  . Retired    Social History Main Topics  . Smoking status: Never Smoker  . Smokeless tobacco: Never Used  . Alcohol use No  . Drug use: No  . Sexual activity: Not on file   Other Topics Concern  . Not on file   Social History Narrative   Lives alone   Caffeine use: coffee/soda      PHYSICAL EXAM  Vitals:   02/06/17 0919  BP: 137/84  Pulse: (!) 56  Weight: 162 lb 6.4 oz (73.7 kg)   Body mass index is 25.44 kg/m.  Generalized: Well developed, in no acute distress   Neurological examination  Mentation: Alert oriented to time, place, history taking. Follows all commands speech and language fluent Cranial nerve II-XII: Pupils were equal round reactive to light. Extraocular movements were full, visual field were full on confrontational test. Facial sensation and strength were normal. Uvula tongue midline. Head turning and shoulder shrug  were normal and symmetric. Motor: The motor testing  reveals 5 over 5 strength of all 4 extremities. Good symmetric motor tone is noted throughout.  Sensory: Sensory testing is intact to soft touch on all 4 extremities. No evidence of extinction is noted.  Coordination: Cerebellar testing reveals good finger-nose-finger and heel-to-shin bilaterally.  Gait and station: Gait is normal. Tandem gait is normal. Romberg is negative. No drift is seen.  Reflexes: Deep tendon reflexes are symmetric and normal bilaterally.   DIAGNOSTIC DATA (LABS, IMAGING, TESTING) - I reviewed patient records, labs, notes, testing and imaging myself where available.  Lab Results  Component Value Date   WBC 5.0 08/16/2016   HGB 13.1 08/16/2016   HCT 38.5 08/16/2016   MCV 89.4 08/16/2016   PLT 208 08/16/2016      Component Value Date/Time   NA 139 08/16/2016 1151   NA 139 05/28/2012 0438   K 3.3 (L) 08/16/2016 1151   K 4.2 05/28/2012 0438   CL 107 08/16/2016 1151   CL 107 05/28/2012 0438   CO2 25 08/16/2016 1151   CO2 25 05/28/2012 0438   GLUCOSE 108 (H) 08/16/2016 1151   GLUCOSE 144 (H) 05/28/2012 0438   BUN 10 08/16/2016 1151   BUN 10 05/28/2012 0438   CREATININE 0.70 08/16/2016 1151   CREATININE 0.77 05/28/2012 0438   CALCIUM 9.3 08/16/2016 1151   CALCIUM 9.2 05/28/2012 0438   PROT 7.5 01/12/2014 1007   PROT 7.0 05/08/2012 1759   ALBUMIN 3.9 01/12/2014 1007   ALBUMIN 3.6 05/08/2012 1759   AST 24 01/12/2014 1007   AST 23 05/08/2012 1759   ALT 18 01/12/2014 1007   ALT 18 05/08/2012 1759   ALKPHOS 67 01/12/2014 1007   ALKPHOS 111 05/08/2012 1759   BILITOT 0.5 01/12/2014 1007   BILITOT 0.4 05/08/2012 1759   GFRNONAA >60 08/16/2016 1151   GFRNONAA >60 05/28/2012 0438   GFRAA >60 08/16/2016 1151   GFRAA >60 05/28/2012 0438       ASSESSMENT AND PLAN 76 y.o. year old female  has a past medical history of Anxiety state, unspecified; Arthropathy, unspecified, site unspecified; Benign carcinoid tumor of the duodenum; Blindness of one eye; Breast  cancer ([place]) (2009); Cancer ([place]); Colon cancer ([place]); Depressive disorder, not elsewhere classified; Diarrhea; Disease of gallbladder; Esophageal cancer ([place]); GERD (gastroesophageal reflux disease); Headache(784.0); Hyperlipidemia; Hypertension; Kidney stones; OA (osteoarthritis) of knee; Personal history of radiation therapy; Syncope and collapse; and Unspecified ectopic pregnancy without intrauterine pregnancy. here with:  1. Headache daily   Patient continues to have a daily headache. She does report that the severity has gotten worse in the last 3 weeks. She is currently not on any medication for her headaches. In August 2017 she had an abnormal MRI. Because her headaches are worsening we will repeat MRI of the brain to look for any acute changes. She is advised that if her symptoms worsen or she develops any new symptoms she should let us know. Of course I advised patient that if her headache becomes severe and is accompanied with other symptoms such as slurred speech, weakness in arms or legs or numbness she should call 911. She voiced understanding. She should keep follow-up in November with Dr. Jaynee Eagles.    Ward Givens, MSN, NP-C 02/06/2017, 9:41 AM [facility] [address] [place], [place] [address] [phone]

## 2017-02-09 ENCOUNTER — Ambulatory Visit
Admission: RE | Admit: 2017-02-09 | Discharge: 2017-02-09 | Disposition: A | Discharge status: Home / Self Care | Payer: Medicare Other | Source: Ambulatory Visit | Attending: Adult Health | Admitting: Adult Health

## 2017-02-09 ENCOUNTER — Telehealth: Payer: Self-pay | Admitting: *Deleted

## 2017-02-09 DIAGNOSIS — R51 Headache: Secondary | ICD-10-CM | POA: Insufficient documentation

## 2017-02-09 DIAGNOSIS — G8929 Other chronic pain: Secondary | ICD-10-CM

## 2017-02-09 MED ORDER — GADOBENATE DIMEGLUMINE 529 MG/ML IV SOLN
15.0000 mL | Freq: Once | INTRAVENOUS | Status: AC | PRN
  Administered 2017-02-09: 15 mL via INTRAVENOUS

## 2017-02-09 NOTE — Telephone Encounter (Signed)
LVM for [name], on DPR and informed her the patient's MRI brain is normal. Advised the office is now closed, left number for any questions.

## 2017-02-10 LAB — POCT I-STAT CREATININE: Creatinine, Ser: 0.9 mg/dL (ref 0.44–1.00)

## 2017-02-10 NOTE — Progress Notes (Signed)
Personally  participated in, made any corrections needed, and agree with history, physical, neuro exam,assessment and plan as stated.     Jamyah Folk, MD [facility]     

## 2017-04-18 ENCOUNTER — Other Ambulatory Visit (HOSPITAL_COMMUNITY): Payer: Self-pay | Admitting: Occupational Therapy

## 2017-04-18 ENCOUNTER — Other Ambulatory Visit: Payer: Self-pay | Admitting: Family Medicine

## 2017-04-18 DIAGNOSIS — Z1231 Encounter for screening mammogram for malignant neoplasm of breast: Secondary | ICD-10-CM

## 2017-04-19 ENCOUNTER — Telehealth: Payer: Self-pay | Admitting: *Deleted

## 2017-04-19 NOTE — Telephone Encounter (Signed)
Called [name] (on DPR) and LVM asking for call back.

## 2017-04-23 ENCOUNTER — Ambulatory Visit: Payer: Medicare Other | Admitting: Neurology

## 2017-05-23 ENCOUNTER — Ambulatory Visit
Admission: RE | Admit: 2017-05-23 | Discharge: 2017-05-23 | Disposition: A | Discharge status: Home / Self Care | Payer: Medicare Other | Source: Ambulatory Visit | Attending: Family Medicine | Admitting: Family Medicine

## 2017-05-23 DIAGNOSIS — Z1231 Encounter for screening mammogram for malignant neoplasm of breast: Secondary | ICD-10-CM | POA: Diagnosis present

## 2017-10-11 HISTORY — PX: OTHER SURGICAL HISTORY: SHX169

## 2017-12-03 ENCOUNTER — Inpatient Hospital Stay
Admission: EM | Admit: 2017-12-03 | Discharge: 2017-12-08 | DRG: 390 | Disposition: A | Discharge status: Home / Self Care | Payer: Medicare Other | Attending: Family Medicine | Admitting: Family Medicine

## 2017-12-03 ENCOUNTER — Emergency Department: Payer: Medicare Other

## 2017-12-03 ENCOUNTER — Encounter: Payer: Self-pay | Admitting: Emergency Medicine

## 2017-12-03 ENCOUNTER — Other Ambulatory Visit: Payer: Self-pay

## 2017-12-03 DIAGNOSIS — Z923 Personal history of irradiation: Secondary | ICD-10-CM | POA: Diagnosis not present

## 2017-12-03 DIAGNOSIS — F411 Generalized anxiety disorder: Secondary | ICD-10-CM | POA: Diagnosis present

## 2017-12-03 DIAGNOSIS — R51 Headache: Secondary | ICD-10-CM | POA: Diagnosis present

## 2017-12-03 DIAGNOSIS — K5652 Intestinal adhesions [bands] with complete obstruction: Secondary | ICD-10-CM | POA: Diagnosis not present

## 2017-12-03 DIAGNOSIS — Z853 Personal history of malignant neoplasm of breast: Secondary | ICD-10-CM

## 2017-12-03 DIAGNOSIS — H5440 Blindness, one eye, unspecified eye: Secondary | ICD-10-CM | POA: Diagnosis present

## 2017-12-03 DIAGNOSIS — E876 Hypokalemia: Secondary | ICD-10-CM | POA: Diagnosis present

## 2017-12-03 DIAGNOSIS — K219 Gastro-esophageal reflux disease without esophagitis: Secondary | ICD-10-CM | POA: Diagnosis present

## 2017-12-03 DIAGNOSIS — Z8501 Personal history of malignant neoplasm of esophagus: Secondary | ICD-10-CM | POA: Diagnosis not present

## 2017-12-03 DIAGNOSIS — Z91041 Radiographic dye allergy status: Secondary | ICD-10-CM

## 2017-12-03 DIAGNOSIS — Z85038 Personal history of other malignant neoplasm of large intestine: Secondary | ICD-10-CM

## 2017-12-03 DIAGNOSIS — E785 Hyperlipidemia, unspecified: Secondary | ICD-10-CM | POA: Diagnosis present

## 2017-12-03 DIAGNOSIS — Z96651 Presence of right artificial knee joint: Secondary | ICD-10-CM | POA: Diagnosis present

## 2017-12-03 DIAGNOSIS — I10 Essential (primary) hypertension: Secondary | ICD-10-CM | POA: Diagnosis present

## 2017-12-03 DIAGNOSIS — Z885 Allergy status to narcotic agent status: Secondary | ICD-10-CM

## 2017-12-03 DIAGNOSIS — K56609 Unspecified intestinal obstruction, unspecified as to partial versus complete obstruction: Secondary | ICD-10-CM | POA: Diagnosis present

## 2017-12-03 DIAGNOSIS — Z79899 Other long term (current) drug therapy: Secondary | ICD-10-CM | POA: Diagnosis not present

## 2017-12-03 DIAGNOSIS — F329 Major depressive disorder, single episode, unspecified: Secondary | ICD-10-CM | POA: Diagnosis present

## 2017-12-03 DIAGNOSIS — F32A Depression, unspecified: Secondary | ICD-10-CM | POA: Diagnosis present

## 2017-12-03 DIAGNOSIS — F419 Anxiety disorder, unspecified: Secondary | ICD-10-CM | POA: Diagnosis present

## 2017-12-03 DIAGNOSIS — Z4659 Encounter for fitting and adjustment of other gastrointestinal appliance and device: Secondary | ICD-10-CM

## 2017-12-03 DIAGNOSIS — Z888 Allergy status to other drugs, medicaments and biological substances status: Secondary | ICD-10-CM

## 2017-12-03 DIAGNOSIS — Z7982 Long term (current) use of aspirin: Secondary | ICD-10-CM | POA: Diagnosis not present

## 2017-12-03 LAB — COMPREHENSIVE METABOLIC PANEL
ALBUMIN: 4.2 g/dL (ref 3.5–5.0)
ALT: 18 U/L (ref 0–44)
AST: 28 U/L (ref 15–41)
Alkaline Phosphatase: 76 U/L (ref 38–126)
Anion gap: 12 (ref 5–15)
BUN: 13 mg/dL (ref 8–23)
CO2: 25 mmol/L (ref 22–32)
CREATININE: 1.07 mg/dL — AB (ref 0.44–1.00)
Calcium: 9.6 mg/dL (ref 8.9–10.3)
Chloride: 106 mmol/L (ref 98–111)
GFR calc Af Amer: 57 mL/min — ABNORMAL LOW (ref >60)
GFR calc non Af Amer: 49 mL/min — ABNORMAL LOW (ref >60)
GLUCOSE: 190 mg/dL — AB (ref 70–99)
POTASSIUM: 3 mmol/L — AB (ref 3.5–5.1)
Sodium: 143 mmol/L (ref 135–145)
TOTAL PROTEIN: 7.7 g/dL (ref 6.5–8.1)
Total Bilirubin: 1.2 mg/dL (ref 0.3–1.2)

## 2017-12-03 LAB — CBC
HEMATOCRIT: 43.1 % (ref 35.0–47.0)
Hemoglobin: 14.5 g/dL (ref 12.0–16.0)
MCH: 30.1 pg (ref 26.0–34.0)
MCHC: 33.6 g/dL (ref 32.0–36.0)
MCV: 89.6 fL (ref 80.0–100.0)
PLATELETS: 203 10*3/uL (ref 150–440)
RBC: 4.81 MIL/uL (ref 3.80–5.20)
RDW: 15 % — AB (ref 11.5–14.5)
WBC: 9.3 10*3/uL (ref 3.6–11.0)

## 2017-12-03 LAB — LIPASE, BLOOD: Lipase: 22 U/L (ref 11–51)

## 2017-12-03 LAB — TROPONIN I: Troponin I: <0.03 ng/mL (ref <0.03)

## 2017-12-03 MED ORDER — DORZOLAMIDE HCL 2 % OP SOLN
1.0000 [drp] | Freq: Two times a day (BID) | OPHTHALMIC | Status: DC
  Administered 2017-12-04 – 2017-12-08 (×7): 1 [drp] via OPHTHALMIC
  Filled 2017-12-03: qty 10

## 2017-12-03 MED ORDER — FENTANYL CITRATE (PF) 100 MCG/2ML IJ SOLN
25.0000 ug | Freq: Once | INTRAMUSCULAR | Status: AC
  Administered 2017-12-03: 25 ug via INTRAVENOUS
  Filled 2017-12-03: qty 2

## 2017-12-03 MED ORDER — ASPIRIN EC 81 MG PO TBEC
81.0000 mg | DELAYED_RELEASE_TABLET | Freq: Every day | ORAL | Status: DC
  Administered 2017-12-04: 81 mg via ORAL
  Filled 2017-12-03: qty 1

## 2017-12-03 MED ORDER — SERTRALINE HCL 50 MG PO TABS
150.0000 mg | ORAL_TABLET | Freq: Every day | ORAL | Status: DC
  Administered 2017-12-04: 150 mg via ORAL
  Filled 2017-12-03: qty 3

## 2017-12-03 MED ORDER — CLONAZEPAM 1 MG PO TABS: 1.0000 mg | ORAL_TABLET | Freq: Two times a day (BID) | ORAL | Status: DC | PRN

## 2017-12-03 MED ORDER — AMLODIPINE BESYLATE 10 MG PO TABS
10.0000 mg | ORAL_TABLET | Freq: Every day | ORAL | Status: DC
  Administered 2017-12-04 – 2017-12-08 (×3): 10 mg via ORAL
  Filled 2017-12-03 (×3): qty 1

## 2017-12-03 MED ORDER — ONDANSETRON HCL 4 MG/2ML IJ SOLN
4.0000 mg | Freq: Four times a day (QID) | INTRAMUSCULAR | Status: DC | PRN
  Administered 2017-12-05: 4 mg via INTRAVENOUS
  Filled 2017-12-03: qty 2

## 2017-12-03 MED ORDER — LATANOPROST 0.005 % OP SOLN
1.0000 [drp] | Freq: Every day | OPHTHALMIC | Status: DC
  Administered 2017-12-04 – 2017-12-07 (×5): 1 [drp] via OPHTHALMIC
  Filled 2017-12-03: qty 2.5

## 2017-12-03 MED ORDER — ACETAMINOPHEN 650 MG RE SUPP: 650.0000 mg | Freq: Four times a day (QID) | RECTAL | Status: DC | PRN

## 2017-12-03 MED ORDER — ENOXAPARIN SODIUM 40 MG/0.4ML ~~LOC~~ SOLN
40.0000 mg | SUBCUTANEOUS | Status: DC
  Administered 2017-12-04 – 2017-12-08 (×5): 40 mg via SUBCUTANEOUS
  Filled 2017-12-03 (×5): qty 0.4

## 2017-12-03 MED ORDER — ONDANSETRON HCL 4 MG/2ML IJ SOLN
4.0000 mg | Freq: Once | INTRAMUSCULAR | Status: AC
  Administered 2017-12-03: 4 mg via INTRAVENOUS
  Filled 2017-12-03: qty 2

## 2017-12-03 MED ORDER — SIMVASTATIN 20 MG PO TABS
20.0000 mg | ORAL_TABLET | Freq: Every day | ORAL | Status: DC
  Filled 2017-12-03 (×2): qty 1

## 2017-12-03 MED ORDER — PANTOPRAZOLE SODIUM 40 MG PO TBEC
40.0000 mg | DELAYED_RELEASE_TABLET | Freq: Every day | ORAL | Status: DC
  Administered 2017-12-04: 40 mg via ORAL
  Filled 2017-12-03: qty 1

## 2017-12-03 MED ORDER — SODIUM CHLORIDE 0.9 % IV BOLUS
1000.0000 mL | Freq: Once | INTRAVENOUS | Status: AC
  Administered 2017-12-03: 1000 mL via INTRAVENOUS

## 2017-12-03 MED ORDER — OXYCODONE HCL 5 MG PO TABS: 5.0000 mg | ORAL_TABLET | ORAL | Status: DC | PRN

## 2017-12-03 MED ORDER — SODIUM CHLORIDE 0.9 % IV SOLN
INTRAVENOUS | Status: DC
  Administered 2017-12-04: via INTRAVENOUS

## 2017-12-03 MED ORDER — ONDANSETRON HCL 4 MG PO TABS: 4.0000 mg | ORAL_TABLET | Freq: Four times a day (QID) | ORAL | Status: DC | PRN

## 2017-12-03 MED ORDER — MEMANTINE HCL 5 MG PO TABS
5.0000 mg | ORAL_TABLET | Freq: Every day | ORAL | Status: DC
  Administered 2017-12-04: 5 mg via ORAL
  Filled 2017-12-03: qty 1

## 2017-12-03 MED ORDER — ACETAMINOPHEN 325 MG PO TABS: 650.0000 mg | ORAL_TABLET | Freq: Four times a day (QID) | ORAL | Status: DC | PRN

## 2017-12-03 MED ORDER — HYDROMORPHONE HCL 1 MG/ML IJ SOLN
0.2500 mg | INTRAMUSCULAR | Status: DC | PRN
  Administered 2017-12-04 – 2017-12-07 (×7): 0.25 mg via INTRAVENOUS
  Filled 2017-12-03 (×7): qty 0.5

## 2017-12-03 NOTE — ED Notes (Signed)
Report to Stacey, RN.

## 2017-12-03 NOTE — ED Provider Notes (Signed)
[facility] Emergency Department Provider Note ____________________________________________   First MD Initiated Contact with Patient 12/03/17 2055     (approximate)  I have reviewed the triage vital signs and the nursing notes.   HISTORY  Chief Complaint Abdominal Pain; Nausea; Emesis; and Fever  HPI [name] is a 77 y.o. female with a history of partial hysterectomy, colectomy as well as cholecystectomy who is presenting with diffuse abdominal pain over the past 24 hours.  The patient says the pain is constant and associated with vomiting.  She usually has diarrhea and gas throughout the day but has not passed any gas or stool throughout the past 24 hours.  Denies any radiation of pain to the back.  Denies any chest pain or shortness of breath.   Past Medical History:  Diagnosis Date  . Anxiety state, unspecified   . Arthropathy, unspecified, site unspecified   . Benign carcinoid tumor of the duodenum   . Blindness of one eye   . Breast cancer ([place]) 2009  . Cancer [facility])    breast  . Colon cancer ([place])   . Depressive disorder, not elsewhere classified   . Diarrhea   . Disease of gallbladder   . Esophageal cancer ([place])   . GERD (gastroesophageal reflux disease)   . Headache(784.0)   . Hyperlipidemia   . Hypertension   . Kidney stones   . OA (osteoarthritis) of knee    right  . Personal history of radiation therapy   . Syncope and collapse   . Unspecified ectopic pregnancy without intrauterine pregnancy     Patient Active Problem List   Diagnosis Date Noted  . TBI (traumatic brain injury) ([place]) 02/29/2016  . 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 Memory loss 02/13/2016  . Chronic headache 02/13/2016  . Snoring 02/13/2016  . Daytime somnolence 02/13/2016  . Bradycardia 03/11/2014  . Preop cardiovascular exam 05/22/2012  . SYNCOPE AND COLLAPSE 07/13/2010  . Hyperlipidemia 03/29/2010  .  Chest pain 10/20/2009  . Nonspecific (abnormal) findings on radiological and other examination of gastrointestinal tract 11/12/2007  . BENIGN CARCINOID TUMOR OF THE DUODENUM 11/11/2007  . ANXIETY 11/11/2007  . DEPRESSION 11/11/2007  . Essential hypertension 11/11/2007  . ECTOPIC PREGNANCY 11/11/2007  . ARTHRITIS 11/11/2007  . HEADACHE 11/11/2007  . DIARRHEA, CHRONIC 11/11/2007    Past Surgical History:  Procedure Laterality Date  . BREAST BIOPSY Bilateral   . BREAST EXCISIONAL BIOPSY Right 2009   (+)  . BREAST SURGERY Right   . CHOLECYSTECTOMY  05-27-12  . COLON RESECTION    . COLONOSCOPY    . PARTIAL HYSTERECTOMY    . TOTAL KNEE ARTHROPLASTY Right     Prior to Admission medications   Medication Sig Start Date End Date Taking? Authorizing Provider  amLODipine (NORVASC) 10 MG tablet Take 10 mg by mouth daily.    [provider]  aspirin 81 MG EC tablet Take 81 mg by mouth daily.     [provider]  Brimonidine Tartrate-Timolol (COMBIGAN OP) Place 1 drop into both eyes 2 (two) times daily.     [provider]  clonazePAM (KLONOPIN) 1 MG tablet Take 1 mg by mouth 2 (two) times daily as needed. Takes 1 to 1 1/2 tablets daily as needed.    [provider]  Cyanocobalamin (VITAMIN B 12 PO) Take 1,000 mcg by mouth daily.    [provider]  dorzolamide (TRUSOPT) 2 % ophthalmic solution Place  1 drop into both eyes 2 (two) times daily.    [provider]  latanoprost (XALATAN) 0.005 % ophthalmic solution Place 1 drop into both eyes at bedtime.    [provider]  Loperamide HCl (ANTI-DIARRHEAL PO) Take by mouth. As needed     [provider]  memantine (NAMENDA) 5 MG tablet Take 1 tablet (5 mg total) by mouth daily. 10/05/16   Melvenia Beam, MD  naproxen (NAPROSYN) 500 MG tablet Take 500 mg by mouth daily.     [provider]  omeprazole (PRILOSEC) 40 MG capsule Take 1 capsule (40 mg total) by mouth daily.  08/17/16   Hillary Bow, MD  potassium chloride (K-DUR,KLOR-CON) 10 MEQ tablet Take 10 mEq by mouth daily.    [provider]  sertraline (ZOLOFT) 100 MG tablet Take 150 mg by mouth daily.     [provider]  simvastatin (ZOCOR) 20 MG tablet Take 20 mg by mouth daily.    [provider]    Allergies Iohexol and Morphine  Family History  Problem Relation Age of Onset  . Cancer Mother        unknown type  . Heart attack Brother   . Diabetes Daughter   . Breast cancer Neg Hx     Social History Social History   Tobacco Use  . Smoking status: Never Smoker  . Smokeless tobacco: Never Used  Substance Use Topics  . Alcohol use: No  . Drug use: No    Review of Systems  Constitutional: No fever/chills Eyes:  patient with baseline visual deficits ENT: No sore throat. Cardiovascular: Denies chest pain. Respiratory: Denies shortness of breath. Gastrointestinal:   No diarrhea.  No constipation. Genitourinary: Negative for dysuria. Musculoskeletal: Negative for back pain. Skin: Negative for rash. Neurological: Negative for headaches, focal weakness or numbness.   ____________________________________________   PHYSICAL EXAM:  VITAL SIGNS: ED Triage Vitals  Enc Vitals Group     BP 12/03/17 2015 (!) 152/107     Pulse --      Resp 12/03/17 2015 18     Temp 12/03/17 2015 99.7 F (37.6 C)     Temp Source 12/03/17 2015 Oral     SpO2 12/03/17 2015 97 %     Weight 12/03/17 2016 160 lb (72.6 kg)     Height 12/03/17 2016 5\' 7"  (1.702 m)     Head Circumference --      Peak Flow --      Pain Score --      Pain Loc --      Pain Edu? --      Excl. in [place]? --     Constitutional: Alert and oriented. Well appearing and in no acute distress. Eyes: Conjunctivae are normal.  Head: Atraumatic. Nose: No congestion/rhinnorhea. Mouth/Throat: Mucous membranes are moist.  Neck: No stridor.   Cardiovascular: Normal rate, regular rhythm. Grossly normal heart  sounds.  Respiratory: Normal respiratory effort.  No retractions. Lungs CTAB. Gastrointestinal: Soft moderate and diffuse tenderness to palpation without any rebound or guarding. No distention. No CVA tenderness. Musculoskeletal: No lower extremity tenderness nor edema.  No joint effusions. Neurologic:  Normal speech and language. No gross focal neurologic deficits are appreciated. Skin:  Skin is warm, dry and intact. No rash noted. Psychiatric: Mood and affect are normal. Speech and behavior are normal.  ____________________________________________   LABS (all labs ordered are listed, but only abnormal results are displayed)  Labs Reviewed  COMPREHENSIVE METABOLIC PANEL - Abnormal;  Notable for the following components:      Result Value   Potassium 3.0 (*)    Glucose, Bld 190 (*)    Creatinine, Ser 1.07 (*)    GFR calc non Af Amer 49 (*)    GFR calc Af Amer 57 (*)    All other components within normal limits  CBC - Abnormal; Notable for the following components:   RDW 15.0 (*)    All other components within normal limits  LIPASE, BLOOD  TROPONIN I  URINALYSIS, COMPLETE (UACMP) WITH MICROSCOPIC   ____________________________________________  EKG  ED ECG REPORT I, Doran Stabler, the attending physician, personally viewed and interpreted this ECG.   Date: 12/03/2017  EKG Time: 2020  Rate: 129  Rhythm: sinus tachycardia  Axis: Left  Intervals:none  ST&T Change: No ST segment elevation or depression.  Biphasic T waves in V3 through 5. No significant change from previous. ____________________________________________  RADIOLOGY  CAT scan positive for small bowel obstruction likely due to adhesions. ____________________________________________   PROCEDURES  Procedure(s) performed:   Procedures  Critical Care performed:   ____________________________________________   INITIAL IMPRESSION / ASSESSMENT AND PLAN / ED COURSE  Pertinent labs & imaging results  that were available during my care of the patient were reviewed by me and considered in my medical decision making (see chart for details).  Differential diagnosis includes, but is not limited to, ovarian cyst, ovarian torsion, acute appendicitis, diverticulitis, urinary tract infection/pyelonephritis, endometriosis, bowel obstruction, colitis, renal colic, gastroenteritis, hernia, fibroids, endometriosis, pregnancy related pain including ectopic pregnancy, etc. As part of my medical decision making, I reviewed the following data within the electronic MEDICAL RECORD NUMBER Notes from prior ED visits  ----------------------------------------- 10:00 PM on 12/03/2017 -----------------------------------------  Patient not currently vomiting.  Patient and family updated about imaging results.  To be admitted to the surgical service. ____________________________________________   FINAL CLINICAL IMPRESSION(S) / ED DIAGNOSES  Final diagnoses:  SBO (small bowel obstruction) ([place])      NEW MEDICATIONS STARTED DURING THIS VISIT:  New Prescriptions   No medications on file     Note:  This document was prepared using Dragon voice recognition software and may include unintentional dictation errors.     Orbie Pyo, MD 12/03/17 2201

## 2017-12-03 NOTE — ED Notes (Signed)
PT could not urinate. Gave PT urine cup and was sent back to lobby with family.

## 2017-12-03 NOTE — ED Triage Notes (Signed)
Pt c/o umbilical abdominal pain, N/V and fever since last night. Pt has hx/o colon cancer.

## 2017-12-03 NOTE — H&P (Signed)
[facility] [place] NAME: [name]    MR#:  [id number]  DATE OF BIRTH:  June 23, 1940  DATE OF ADMISSION:  12/03/2017  PRIMARY CARE PHYSICIAN: Denton Lank, MD   REQUESTING/REFERRING PHYSICIAN: Clearnce Hasten  CHIEF COMPLAINT:   Chief Complaint  Patient presents with  . Abdominal Pain  . Nausea  . Emesis  . Fever    HISTORY OF PRESENT ILLNESS:  [name]  is a 77 y.o. female who presents with abdominal pain, nausea vomiting.  Here in the ED the patient was found to have small bowel obstruction.  Hospitalist were called for admission  PAST MEDICAL HISTORY:   Past Medical History:  Diagnosis Date  . Anxiety state, unspecified   . Arthropathy, unspecified, site unspecified   . Benign carcinoid tumor of the duodenum   . Blindness of one eye   . Breast cancer ([place]) 2009  . Cancer [facility]    breast  . Colon cancer ([place])   . Depressive disorder, not elsewhere classified   . Diarrhea   . Disease of gallbladder   . Esophageal cancer ([place])   . GERD (gastroesophageal reflux disease)   . Headache(784.0)   . Hyperlipidemia   . Hypertension   . Kidney stones   . OA (osteoarthritis) of knee    right  . Personal history of radiation therapy   . Syncope and collapse   . Unspecified ectopic pregnancy without intrauterine pregnancy      PAST SURGICAL HISTORY:   Past Surgical History:  Procedure Laterality Date  . BREAST BIOPSY Bilateral   . BREAST EXCISIONAL BIOPSY Right 2009   (+)  . BREAST SURGERY Right   . CHOLECYSTECTOMY  05-27-12  . COLON RESECTION    . COLONOSCOPY    . PARTIAL HYSTERECTOMY    . TOTAL KNEE ARTHROPLASTY Right      SOCIAL HISTORY:   Social History   Tobacco Use  . Smoking status: Never Smoker  . Smokeless tobacco: Never Used  Substance Use Topics  . Alcohol use: No     FAMILY HISTORY:   Family History  Problem Relation Age of Onset  . Cancer Mother        unknown type  . Heart  attack Brother   . Diabetes Daughter   . Breast cancer Neg Hx      DRUG ALLERGIES:   Allergies  Allergen Reactions  . Iohexol Hives     Code: HIVES, Desc: PTS GRANDDAUGHTER [name] CALLED 12/31/08 TO NOTIFY us THAT HER GRANDMOTHER HAD A REACTION TO THE IV DYE 8 HRS POST CT EXAM PT DEVELOPED HIVES/RASH.12/31/08/RM, Onset Date: [id number]   . Morphine Other (See Comments)    confusion    MEDICATIONS AT HOME:   Prior to Admission medications   Medication Sig Start Date End Date Taking? Authorizing Provider  amLODipine (NORVASC) 10 MG tablet Take 10 mg by mouth daily.    [provider]  aspirin 81 MG EC tablet Take 81 mg by mouth daily.     [provider]  Brimonidine Tartrate-Timolol (COMBIGAN OP) Place 1 drop into both eyes 2 (two) times daily.     [provider]  clonazePAM (KLONOPIN) 1 MG tablet Take 1 mg by mouth 2 (two) times daily as needed. Takes 1 to 1 1/2 tablets daily as needed.    [provider]  Cyanocobalamin (VITAMIN B 12 PO) Take 1,000 mcg by mouth daily.    [provider]  dorzolamide (TRUSOPT) 2 % ophthalmic solution Place 1 drop into both eyes 2 (two) times daily.    [provider]  latanoprost (XALATAN) 0.005 % ophthalmic solution Place 1 drop into both eyes at bedtime.    [provider]  Loperamide HCl (ANTI-DIARRHEAL PO) Take by mouth. As needed     [provider]  memantine (NAMENDA) 5 MG tablet Take 1 tablet (5 mg total) by mouth daily. 10/05/16   Melvenia Beam, MD  naproxen (NAPROSYN) 500 MG tablet Take 500 mg by mouth daily.     [provider]  omeprazole (PRILOSEC) 40 MG capsule Take 1 capsule (40 mg total) by mouth daily. 08/17/16   Hillary Bow, MD  potassium chloride (K-DUR,KLOR-CON) 10 MEQ tablet Take 10 mEq by mouth daily.    [provider]  sertraline (ZOLOFT) 100 MG tablet Take 150 mg by mouth daily.     [provider]  simvastatin (ZOCOR) 20 MG  tablet Take 20 mg by mouth daily.    [provider]    REVIEW OF SYSTEMS:  Review of Systems  Constitutional: Negative for chills, fever, malaise/fatigue and weight loss.  HENT: Negative for ear pain, hearing loss and tinnitus.   Eyes: Negative for blurred vision, double vision, pain and redness.  Respiratory: Negative for cough, hemoptysis and shortness of breath.   Cardiovascular: Negative for chest pain, palpitations, orthopnea and leg swelling.  Gastrointestinal: Positive for abdominal pain, nausea and vomiting. Negative for constipation and diarrhea.  Genitourinary: Negative for dysuria, frequency and hematuria.  Musculoskeletal: Negative for back pain, joint pain and neck pain.  Skin:       No acne, rash, or lesions  Neurological: Negative for dizziness, tremors, focal weakness and weakness.  Endo/Heme/Allergies: Negative for polydipsia. Does not bruise/bleed easily.  Psychiatric/Behavioral: Negative for depression. The patient is not nervous/anxious and does not have insomnia.      VITAL SIGNS:   Vitals:   12/03/17 2015 12/03/17 2016  BP: (!) 152/107   Resp: 18   Temp: 99.7 F (37.6 C)   TempSrc: Oral   SpO2: 97%   Weight:  72.6 kg (160 lb)  Height:  5\' 7"  (1.702 m)   Wt Readings from Last 3 Encounters:  12/03/17 72.6 kg (160 lb)  02/06/17 73.7 kg (162 lb 6.4 oz)  10/05/16 81.4 kg (179 lb 6.4 oz)    PHYSICAL EXAMINATION:  Physical Exam  Vitals reviewed. Constitutional: She is oriented to person, place, and time. She appears well-developed and well-nourished. No distress.  HENT:  Head: Normocephalic and atraumatic.  Mouth/Throat: Oropharynx is clear and moist.  Eyes: Pupils are equal, round, and reactive to light. Conjunctivae and EOM are normal. No scleral icterus.  Neck: Normal range of motion. Neck supple. No JVD present. No thyromegaly present.  Cardiovascular: Normal rate, regular rhythm and intact distal pulses. Exam reveals no gallop and no  friction rub.  No murmur heard. Respiratory: Effort normal and breath sounds normal. No respiratory distress. She has no wheezes. She has no rales.  GI: Soft. Bowel sounds are normal. She exhibits no distension. There is tenderness.  Musculoskeletal: Normal range of motion. She exhibits no edema.  No arthritis, no gout  Lymphadenopathy:    She has no cervical adenopathy.  Neurological: She is alert and oriented to person, place, and time. No cranial nerve deficit.  No dysarthria, no aphasia  Skin: Skin is warm and dry. No rash noted. No erythema.  Psychiatric: She has a normal mood  and affect. Her behavior is normal. Judgment and thought content normal.    LABORATORY PANEL:   CBC Recent Labs  Lab 12/03/17 2023  WBC 9.3  HGB 14.5  HCT 43.1  PLT 203   ------------------------------------------------------------------------------------------------------------------  Chemistries  Recent Labs  Lab 12/03/17 2023  NA 143  K 3.0*  CL 106  CO2 25  GLUCOSE 190*  BUN 13  CREATININE 1.07*  CALCIUM 9.6  AST 28  ALT 18  ALKPHOS 76  BILITOT 1.2   ------------------------------------------------------------------------------------------------------------------  Cardiac Enzymes Recent Labs  Lab 12/03/17 2023  TROPONINI <0.03   ------------------------------------------------------------------------------------------------------------------  RADIOLOGY:  Ct Abdomen Pelvis Wo Contrast  Result Date: 12/03/2017 CLINICAL DATA:  Nausea, vomiting and fever since last night. EXAM: CT ABDOMEN AND PELVIS WITHOUT CONTRAST TECHNIQUE: Multidetector CT imaging of the abdomen and pelvis was performed following the standard protocol without IV contrast. COMPARISON:  CT abdomen and pelvis 03/14/2013. FINDINGS: Lower chest: There is cardiomegaly. No pleural or pericardial effusion. Mild dependent atelectasis is seen in the lung bases. Hepatobiliary: Previously seen low attenuating lesion in the  liver which had measured 4.4 cm AP x 4.4 cm transverse today measures 2.6 x 2.2 cm on axial image 31 and is most consistent with an involuting cyst. Status post cholecystectomy. No biliary dilatation. Pancreas: Unremarkable. No pancreatic ductal dilatation or surrounding inflammatory changes. Spleen: Normal in size without focal abnormality. Adrenals/Urinary Tract: Right adrenal adenoma is unchanged. The left adrenal gland is unremarkable. Bilateral renal cysts are seen and were present on the prior examination. Nonobstructing stone upper pole left kidney measures 0.4 cm. No hydronephrosis. No ureteral or urinary bladder stones. Stomach/Bowel: Large hiatal hernia is identified and was present on the prior examination. The patient is status post right hemicolectomy. Remaining colon is almost completely decompressed. Small bowel is dilated up to approximately 3.3 cm with short air-fluid levels present. Transition point is difficult to localize but appears to be in the anterior aspect of the inferior pelvis where loops appear adhesed together. No pneumatosis, portal venous gas or free intraperitoneal air is identified. There is a small volume of abdominal and pelvic ascites. No organized fluid collection. Vascular/Lymphatic: Aortic atherosclerosis. No enlarged abdominal or pelvic lymph nodes. Reproductive: Small calcified uterine fibroid noted. No adnexal mass. Other: Small fat containing umbilical hernia is identified. Musculoskeletal: No acute or focal abnormality. IMPRESSION: The examination is positive for small bowel obstruction which is likely due to adhesions. Transition point appears to be in the inferior aspect of the pelvis but is difficult to localize. Negative for evidence bowel ischemia. Small volume of abdominal and pelvic ascites is likely related to obstruction. Large hiatal hernia. Small nonobstructing stone left kidney. Atherosclerosis. Status post right hemicolectomy. Electronically Signed   By:  Inge Rise M.D.   On: 12/03/2017 21:46    EKG:   Orders placed or performed during the hospital encounter of 12/03/17  . ED EKG  . ED EKG  . EKG 12-Lead  . EKG 12-Lead    IMPRESSION AND PLAN:  Principal Problem:   SBO (small bowel obstruction) (HCC) -NG tube for gastric decompression, surgery consult, PRN analgesia and antiemetics Active Problems:   Anxiety -home dose anxiolytic   Depression -home dose antidepressant   Essential hypertension -home dose antihypertensives   Hyperlipidemia -Home dose antilipid   GERD (gastroesophageal reflux disease) -home dose PPI  Chart review performed and case discussed with ED provider. Labs, imaging and/or ECG reviewed by provider and discussed with patient/family. Management plans discussed with the patient  and/or family.  DVT PROPHYLAXIS: SubQ lovenox  GI PROPHYLAXIS: PPI  ADMISSION STATUS: Inpatient  CODE STATUS: Full Code Status History    Date Active Date Inactive Code Status Order ID Comments User Context   08/16/2016 1850 08/17/2016 1518 Full Code [id number]  Hillary Bow, MD ED      TOTAL TIME TAKING CARE OF THIS PATIENT: 45 minutes.   Breck Hollinger FIELDING 12/03/2017, 10:29 PM  [facility]  Office  [phone]  CC: Primary care physician; Denton Lank, MD  Note:  This document was prepared using Dragon voice recognition software and may include unintentional dictation errors.

## 2017-12-04 ENCOUNTER — Inpatient Hospital Stay: Payer: Medicare Other

## 2017-12-04 DIAGNOSIS — K56609 Unspecified intestinal obstruction, unspecified as to partial versus complete obstruction: Principal | ICD-10-CM

## 2017-12-04 LAB — URINALYSIS, COMPLETE (UACMP) WITH MICROSCOPIC
BILIRUBIN URINE: NEGATIVE
Bacteria, UA: NONE SEEN
GLUCOSE, UA: NEGATIVE mg/dL
KETONES UR: NEGATIVE mg/dL
Nitrite: NEGATIVE
PH: 6 (ref 5.0–8.0)
Protein, ur: NEGATIVE mg/dL
Specific Gravity, Urine: 1.012 (ref 1.005–1.030)

## 2017-12-04 LAB — CBC
HEMATOCRIT: 39.6 % (ref 35.0–47.0)
Hemoglobin: 13.3 g/dL (ref 12.0–16.0)
MCH: 30.2 pg (ref 26.0–34.0)
MCHC: 33.6 g/dL (ref 32.0–36.0)
MCV: 89.7 fL (ref 80.0–100.0)
PLATELETS: 181 10*3/uL (ref 150–440)
RBC: 4.41 MIL/uL (ref 3.80–5.20)
RDW: 15.7 % — AB (ref 11.5–14.5)
WBC: 11.3 10*3/uL — ABNORMAL HIGH (ref 3.6–11.0)

## 2017-12-04 LAB — BASIC METABOLIC PANEL
Anion gap: 10 (ref 5–15)
BUN: 14 mg/dL (ref 8–23)
CHLORIDE: 107 mmol/L (ref 98–111)
CO2: 27 mmol/L (ref 22–32)
CREATININE: 0.9 mg/dL (ref 0.44–1.00)
Calcium: 9 mg/dL (ref 8.9–10.3)
GFR calc Af Amer: >60 mL/min (ref >60)
GFR calc non Af Amer: >60 mL/min (ref >60)
GLUCOSE: 140 mg/dL — AB (ref 70–99)
POTASSIUM: 2.8 mmol/L — AB (ref 3.5–5.1)
SODIUM: 144 mmol/L (ref 135–145)

## 2017-12-04 MED ORDER — LABETALOL HCL 5 MG/ML IV SOLN
10.0000 mg | INTRAVENOUS | Status: DC | PRN
  Administered 2017-12-04 – 2017-12-06 (×3): 10 mg via INTRAVENOUS
  Filled 2017-12-04 (×3): qty 4

## 2017-12-04 MED ORDER — POTASSIUM CHLORIDE IN NACL 20-0.45 MEQ/L-% IV SOLN
INTRAVENOUS | Status: DC
  Filled 2017-12-04: qty 1000

## 2017-12-04 MED ORDER — HYDROMORPHONE HCL 1 MG/ML PO LIQD
0.5000 mg | ORAL | Status: DC | PRN
  Filled 2017-12-04: qty 0.5

## 2017-12-04 MED ORDER — POTASSIUM CHLORIDE 10 MEQ/100ML IV SOLN: 10.0000 meq | INTRAVENOUS | Status: DC

## 2017-12-04 MED ORDER — DEXTROSE IN LACTATED RINGERS 5 % IV SOLN
INTRAVENOUS | Status: DC
  Administered 2017-12-04 – 2017-12-05 (×3): via INTRAVENOUS

## 2017-12-04 MED ORDER — POTASSIUM CHLORIDE 10 MEQ/100ML IV SOLN
10.0000 meq | INTRAVENOUS | Status: AC
  Administered 2017-12-04 (×6): 10 meq via INTRAVENOUS
  Filled 2017-12-04 (×6): qty 100

## 2017-12-04 MED ORDER — ENALAPRILAT 1.25 MG/ML IV SOLN
0.6250 mg | Freq: Two times a day (BID) | INTRAVENOUS | Status: DC
  Administered 2017-12-04 – 2017-12-07 (×8): 0.625 mg via INTRAVENOUS
  Filled 2017-12-04 (×10): qty 0.5

## 2017-12-04 NOTE — Consult Note (Signed)
Patient ID: [name], female   DOB: 05-Apr-1941, 77 y.o.   MRN: [id number]  HPI [name] is a 77 y.o. female asked to see in consultation by Dr.Schaevitz ( case d/w him).  He presents to the emergency room with abdominal pain nausea and vomiting.  Reports that the pain started 36 hours ago.  Is intermittent colicky type and diffuse.  No [place] alleviating or aggravating factors.  She does have a complex surgical history including a right colectomy, laparoscopic cholecystectomy by Dr. Pat Patrick and apparently a gastrectomy and esophagectomy for esophageal cancer this was performed in [place] more than 10 years ago and no records are available today. She also denies passing any flatus over the last 24 hours and constipation.  No fevers no chills no shortness of breath or chest pain. The scan personally reviewed showing evidence of dilated loops of small bowel.  No discrete transition point.  No pneumatosis, no free air.  Creatinine is 1.07 and potassium is 3.0, white count is 9.  HPI  Past Medical History:  Diagnosis Date  . Anxiety state, unspecified   . Arthropathy, unspecified, site unspecified   . Benign carcinoid tumor of the duodenum   . Blindness of one eye   . Breast cancer ([place]) 2009  . Cancer [facility])    breast  . Colon cancer ([place])   . Depressive disorder, not elsewhere classified   . Diarrhea   . Disease of gallbladder   . Esophageal cancer ([place])   . GERD (gastroesophageal reflux disease)   . Headache(784.0)   . Hyperlipidemia   . Hypertension   . Kidney stones   . OA (osteoarthritis) of knee    right  . Personal history of radiation therapy   . Syncope and collapse   . Unspecified ectopic pregnancy without intrauterine pregnancy     Past Surgical History:  Procedure Laterality Date  . BREAST BIOPSY Bilateral   . BREAST EXCISIONAL BIOPSY Right 2009   (+)  . BREAST SURGERY Right   . CHOLECYSTECTOMY  05-27-12  . COLON RESECTION    . COLONOSCOPY    .  PARTIAL HYSTERECTOMY    . TOTAL KNEE ARTHROPLASTY Right     Family History  Problem Relation Age of Onset  . Cancer Mother        unknown type  . Heart attack Brother   . Diabetes Daughter   . Breast cancer Neg Hx     Social History Social History   Tobacco Use  . Smoking status: Never Smoker  . Smokeless tobacco: Never Used  Substance Use Topics  . Alcohol use: No  . Drug use: No    Allergies  Allergen Reactions  . Iohexol Hives     Code: HIVES, Desc: PTS GRANDDAUGHTER [name] CALLED 12/31/08 TO NOTIFY us THAT HER GRANDMOTHER HAD A REACTION TO THE IV DYE 8 HRS POST CT EXAM PT DEVELOPED HIVES/RASH.12/31/08/RM, Onset Date: [id number]   . Morphine Other (See Comments)    confusion    Current Facility-Administered Medications  Medication Dose Route Frequency Provider Last Rate Last Dose  . 0.45 % NaCl with KCl 20 mEq / L infusion   Intravenous Continuous Sudini, Srikar, MD      . 0.9 %  sodium chloride infusion   Intravenous Continuous Lance Coon, MD 75 mL/hr at 12/04/17 0025    . acetaminophen (TYLENOL) tablet 650 mg  650 mg Oral Q6H PRN Lance Coon, MD       Or  . acetaminophen (  TYLENOL) suppository 650 mg  650 mg Rectal Q6H PRN Lance Coon, MD      . amLODipine (NORVASC) tablet 10 mg  10 mg Oral Daily Lance Coon, MD      . aspirin EC tablet 81 mg  81 mg Oral Daily Lance Coon, MD      . clonazePAM [facility] [place]) tablet 1 mg  1 mg Oral BID PRN Lance Coon, MD      . dorzolamide (TRUSOPT) 2 % ophthalmic solution 1 drop  1 drop Both Eyes BID Lance Coon, MD      . enalaprilat (VASOTEC) injection 0.625 mg  0.625 mg Intravenous BID Sudini, Alveta Heimlich, MD      . enoxaparin (LOVENOX) injection 40 mg  40 mg Subcutaneous Q24H Lance Coon, MD   40 mg at 12/04/17 0517  . HYDROmorphone (DILAUDID) injection 0.25 mg  0.25 mg Intravenous Q4H PRN Lance Coon, MD      . labetalol (NORMODYNE,TRANDATE) injection 10 mg  10 mg Intravenous Q2H PRN Lance Coon, MD   10 mg at 12/04/17  0108  . latanoprost (XALATAN) 0.005 % ophthalmic solution 1 drop  1 drop Both Eyes QHS Lance Coon, MD   1 drop at 12/04/17 0049  . memantine (NAMENDA) tablet 5 mg  5 mg Oral Daily Lance Coon, MD      . ondansetron [facility]) tablet 4 mg  4 mg Oral Q6H PRN Lance Coon, MD       Or  . ondansetron [facility]) injection 4 mg  4 mg Intravenous Q6H PRN Lance Coon, MD      . oxyCODONE (Oxy IR/ROXICODONE) immediate release tablet 5 mg  5 mg Oral Q4H PRN Lance Coon, MD      . pantoprazole (PROTONIX) EC tablet 40 mg  40 mg Oral Daily Lance Coon, MD      . potassium chloride 10 mEq in 100 mL IVPB  10 mEq Intravenous Q1 Hr x 4 Sudini, Srikar, MD      . sertraline (ZOLOFT) tablet 150 mg  150 mg Oral Daily Lance Coon, MD      . simvastatin (ZOCOR) tablet 20 mg  20 mg Oral Daily Lance Coon, MD         Review of Systems Full ROS  was asked and was negative except for the information on the HPI  Physical Exam Blood pressure (!) 150/91, pulse 68, temperature 97.7 F (36.5 C), temperature source Oral, resp. rate 20, height 5\' 7"  (1.702 m), weight 72.6 kg (160 lb), SpO2 96 %. CONSTITUTIONAL: NAD EYES: Pupils are equal, round, and reactive to light, Sclera are non-icteric. EARS, NOSE, MOUTH AND THROAT: The oropharynx is clear. The oral mucosa is pink and moist. Hearing is intact to voice. LYMPH NODES:  Lymph nodes in the neck are normal. RESPIRATORY:  Lungs are clear. There is normal respiratory effort, with equal breath sounds bilaterally, and without pathologic use of accessory muscles. CARDIOVASCULAR: Heart is regular without murmurs, gallops, or rubs. GI: The abdomen is  soft, nontender, and nondistended. There are no palpable masses. There is no hepatosplenomegaly. There are decrease bowel sounds, mild distension, no peritonitis. Previous generous mildine scar. GU: Rectal deferred.   MUSCULOSKELETAL: Normal muscle strength and tone. No cyanosis or edema.   SKIN: Turgor is good and  there are no pathologic skin lesions or ulcers. NEUROLOGIC: Motor and sensation is grossly normal. Cranial nerves are grossly intact. PSYCH:  Oriented to person, place and time. Affect is normal.  Data Reviewed  I have personally  reviewed the patient's imaging, laboratory findings and medical records.    Assessment/Plan 77 year old female with multiple abdominal operations now comes in with small bowel obstruction.  We will continue medical management with NG tube decompression, n.p.o. serial abdominal exams and x-rays.  No need for surgical intervention at this time.  Since patient is allergic to contrast dye I am hesitant about doing a Gastrografin challenge.  We may touch base with radiology for an alternative.  For now we will repeat an x-ray in the morning as well as labs and replace potassium aggressively.   Caroleen Hamman, MD FACS General Surgeon 12/04/2017, 9:52 AM

## 2017-12-04 NOTE — Progress Notes (Signed)
Spoke with Dr. Jannifer Franklin regarding abdominal xray and was instructed to advance NG tube 6cm and another xray was ordered.  Greater output noted after advancement of NG.  [name]  12/04/2017 3:46 AM

## 2017-12-04 NOTE — Progress Notes (Signed)
Potassium runs will finish after the discontinue time. Patient not able to tolerate at a rate of 163ml/hr. Currently running at 54ml/hr.

## 2017-12-04 NOTE — Progress Notes (Signed)
Called Dr. Jannifer Franklin regarding abdominal xray to verify placement of NG tube and iv blood pressure medication.  Appropriate orders were placed.  Phoebe Sharps N  12/04/2017  1:32 AM

## 2017-12-04 NOTE — Progress Notes (Signed)
[facility] [place] NAME: [name]    MR#:  [id number]  DATE OF BIRTH:  07/26/1940  SUBJECTIVE:  CHIEF COMPLAINT:   Chief Complaint  Patient presents with  . Abdominal Pain  . Nausea  . Emesis  . Fever   Abd pain is better. NGT in place  REVIEW OF SYSTEMS:    Review of Systems  Constitutional: Positive for malaise/fatigue. Negative for chills and fever.  HENT: Negative for sore throat.   Eyes: Negative for blurred vision, double vision and pain.  Respiratory: Negative for cough, hemoptysis, shortness of breath and wheezing.   Cardiovascular: Negative for chest pain, palpitations, orthopnea and leg swelling.  Gastrointestinal: Positive for abdominal pain and nausea. Negative for constipation, diarrhea, heartburn and vomiting.  Genitourinary: Negative for dysuria and hematuria.  Musculoskeletal: Negative for back pain and joint pain.  Skin: Negative for rash.  Neurological: Negative for sensory change, speech change, focal weakness and headaches.  Endo/Heme/Allergies: Does not bruise/bleed easily.  Psychiatric/Behavioral: Negative for depression. The patient is not nervous/anxious.     DRUG ALLERGIES:   Allergies  Allergen Reactions  . Iohexol Hives     Code: HIVES, Desc: PTS GRANDDAUGHTER [name] CALLED 12/31/08 TO NOTIFY us THAT HER GRANDMOTHER HAD A REACTION TO THE IV DYE 8 HRS POST CT EXAM PT DEVELOPED HIVES/RASH.12/31/08/RM, Onset Date: [id number]   . Morphine Other (See Comments)    confusion    VITALS:  Blood pressure (!) 154/93, pulse 64, temperature 98 F (36.7 C), temperature source Oral, resp. rate 19, height 5\' 7"  (1.702 m), weight 72.6 kg (160 lb), SpO2 95 %.  PHYSICAL EXAMINATION:   Physical Exam  GENERAL:  77 y.o.-year-old patient lying in the bed with no acute distress.  EYES: Pupils equal, round, reactive to light and accommodation. No scleral icterus. Extraocular muscles intact.  HEENT: Head  atraumatic, normocephalic. Oropharynx and nasopharynx clear.  NGT NECK:  Supple, no jugular venous distention. No thyroid enlargement, no tenderness.  LUNGS: Normal breath sounds bilaterally, no wheezing, rales, rhonchi. No use of accessory muscles of respiration.  CARDIOVASCULAR: S1, S2 normal. No murmurs, rubs, or gallops.  ABDOMEN: Soft, nontender, nondistended. Bowel sounds present. No organomegaly or mass.  EXTREMITIES: No cyanosis, clubbing or edema b/l.    NEUROLOGIC: Cranial nerves II through XII are intact. No focal Motor or sensory deficits b/l.   PSYCHIATRIC: The patient is alert and oriented x 3.  SKIN: No obvious rash, lesion, or ulcer.   LABORATORY PANEL:   CBC Recent Labs  Lab 12/04/17 0553  WBC 11.3*  HGB 13.3  HCT 39.6  PLT 181   ------------------------------------------------------------------------------------------------------------------ Chemistries  Recent Labs  Lab 12/03/17 2023 12/04/17 0553  NA 143 144  K 3.0* 2.8*  CL 106 107  CO2 25 27  GLUCOSE 190* 140*  BUN 13 14  CREATININE 1.07* 0.90  CALCIUM 9.6 9.0  AST 28  --   ALT 18  --   ALKPHOS 76  --   BILITOT 1.2  --    ------------------------------------------------------------------------------------------------------------------  Cardiac Enzymes Recent Labs  Lab 12/03/17 2023  TROPONINI <0.03   ------------------------------------------------------------------------------------------------------------------  RADIOLOGY:  Ct Abdomen Pelvis Wo Contrast  Result Date: 12/03/2017 CLINICAL DATA:  Nausea, vomiting and fever since last night. EXAM: CT ABDOMEN AND PELVIS WITHOUT CONTRAST TECHNIQUE: Multidetector CT imaging of the abdomen and pelvis was performed following the standard protocol without IV contrast. COMPARISON:  CT abdomen and pelvis 03/14/2013. FINDINGS: Lower chest: There is cardiomegaly.  No pleural or pericardial effusion. Mild dependent atelectasis is seen in the lung bases.  Hepatobiliary: Previously seen low attenuating lesion in the liver which had measured 4.4 cm AP x 4.4 cm transverse today measures 2.6 x 2.2 cm on axial image 31 and is most consistent with an involuting cyst. Status post cholecystectomy. No biliary dilatation. Pancreas: Unremarkable. No pancreatic ductal dilatation or surrounding inflammatory changes. Spleen: Normal in size without focal abnormality. Adrenals/Urinary Tract: Right adrenal adenoma is unchanged. The left adrenal gland is unremarkable. Bilateral renal cysts are seen and were present on the prior examination. Nonobstructing stone upper pole left kidney measures 0.4 cm. No hydronephrosis. No ureteral or urinary bladder stones. Stomach/Bowel: Large hiatal hernia is identified and was present on the prior examination. The patient is status post right hemicolectomy. Remaining colon is almost completely decompressed. Small bowel is dilated up to approximately 3.3 cm with short air-fluid levels present. Transition point is difficult to localize but appears to be in the anterior aspect of the inferior pelvis where loops appear adhesed together. No pneumatosis, portal venous gas or free intraperitoneal air is identified. There is a small volume of abdominal and pelvic ascites. No organized fluid collection. Vascular/Lymphatic: Aortic atherosclerosis. No enlarged abdominal or pelvic lymph nodes. Reproductive: Small calcified uterine fibroid noted. No adnexal mass. Other: Small fat containing umbilical hernia is identified. Musculoskeletal: No acute or focal abnormality. IMPRESSION: The examination is positive for small bowel obstruction which is likely due to adhesions. Transition point appears to be in the inferior aspect of the pelvis but is difficult to localize. Negative for evidence bowel ischemia. Small volume of abdominal and pelvic ascites is likely related to obstruction. Large hiatal hernia. Small nonobstructing stone left kidney. Atherosclerosis.  Status post right hemicolectomy. Electronically Signed   By: Inge Rise M.D.   On: 12/03/2017 21:46   Dg Abd 1 View  Result Date: 12/04/2017 CLINICAL DATA:  NG tube placement EXAM: ABDOMEN - 1 VIEW COMPARISON:  12/04/2017, CT 12/03/2017 FINDINGS: Esophageal tube tip projects over the gastric body with large hiatal hernia noted. Dilated loops of small bowel in the central abdomen consistent with a bowel obstruction. Atelectasis at the left base. IMPRESSION: Esophageal tube tip overlies the gastric body; a hiatal hernia containing the stomach is noted. Electronically Signed   By: Donavan Foil M.D.   On: 12/04/2017 02:17   Dg Abd 1 View  Result Date: 12/04/2017 CLINICAL DATA:  NG tube placement EXAM: ABDOMEN - 1 VIEW COMPARISON:  CT abdomen pelvis 12/03/2017 FINDINGS: There is a large hiatal hernia with the tip and side port of the nasogastric tube projecting over the intrathoracic portion of the stomach. Bibasilar atelectasis. Dilated small bowel seen in the abdomen. IMPRESSION: Large hiatal hernia with tip of nasogastric tube within the intrathoracic portion of the stomach. Electronically Signed   By: Ulyses Jarred M.D.   On: 12/04/2017 01:32   Dg Abd Acute W/chest  Result Date: 12/04/2017 CLINICAL DATA:  Small bowel obstruction EXAM: DG ABDOMEN ACUTE W/ 1V CHEST COMPARISON:  CT 12/03/2017 FINDINGS: NG tube is within the large hiatal hernia. Continued dilated small bowel loops compatible with small bowel obstruction. Prior cholecystectomy. No free air or organomegaly. Cardiomegaly with vascular congestion. Tortuous and ectatic thoracic aorta. Bibasilar atelectasis noted. IMPRESSION: Continued small bowel obstruction pattern, similar to prior studies. NG tube within a large hiatal hernia. Cardiomegaly, bibasilar atelectasis and vascular congestion. Electronically Signed   By: Rolm Baptise M.D.   On: 12/04/2017 09:07  ASSESSMENT AND PLAN:    * SBO (small bowel obstruction) NG tube for gastric  decompression Per primary team   * Anxiety - home dose anxiolytic   * Depression - home dose antidepressant   * Essential hypertension - home dose antihypertensives Added Iv vasotec   * Hyperlipidemia - Home dose antilipid  All the records are reviewed and case discussed with Care Management/Social Workerr. Management plans discussed with the patient, family and they are in agreement.  CODE STATUS: FULL CODE  DVT Prophylaxis: SCDs  TOTAL TIME TAKING CARE OF THIS PATIENT: 30 minutes.   POSSIBLE D/C IN 2-3 DAYS, DEPENDING ON CLINICAL CONDITION.  Leia Alf Kalen Ratajczak M.D on 12/04/2017 at 2:22 PM  Between 7am to 6pm - Pager - [phone]  After 6pm go to [email or web address] - password [email or web address] [facility]  Office  [phone]  CC: Primary care physician; Denton Lank, MD  Note: This dictation was prepared with Dragon dictation along with smaller phrase technology. Any transcriptional errors that result from this process are unintentional.

## 2017-12-05 ENCOUNTER — Inpatient Hospital Stay: Payer: Medicare Other

## 2017-12-05 LAB — BASIC METABOLIC PANEL
Anion gap: 7 (ref 5–15)
BUN: 9 mg/dL (ref 8–23)
CALCIUM: 8.9 mg/dL (ref 8.9–10.3)
CO2: 27 mmol/L (ref 22–32)
CREATININE: 0.8 mg/dL (ref 0.44–1.00)
Chloride: 108 mmol/L (ref 98–111)
GFR calc Af Amer: >60 mL/min (ref >60)
GFR calc non Af Amer: >60 mL/min (ref >60)
Glucose, Bld: 137 mg/dL — ABNORMAL HIGH (ref 70–99)
Potassium: 3.1 mmol/L — ABNORMAL LOW (ref 3.5–5.1)
Sodium: 142 mmol/L (ref 135–145)

## 2017-12-05 LAB — CBC
HCT: 37.1 % (ref 35.0–47.0)
Hemoglobin: 12.3 g/dL (ref 12.0–16.0)
MCH: 29.8 pg (ref 26.0–34.0)
MCHC: 33.3 g/dL (ref 32.0–36.0)
MCV: 89.6 fL (ref 80.0–100.0)
PLATELETS: 180 10*3/uL (ref 150–440)
RBC: 4.14 MIL/uL (ref 3.80–5.20)
RDW: 15.8 % — AB (ref 11.5–14.5)
WBC: 10.2 10*3/uL (ref 3.6–11.0)

## 2017-12-05 MED ORDER — POTASSIUM CHLORIDE 10 MEQ/100ML IV SOLN
10.0000 meq | INTRAVENOUS | Status: AC
  Administered 2017-12-05 (×6): 10 meq via INTRAVENOUS
  Filled 2017-12-05 (×5): qty 100

## 2017-12-05 MED ORDER — BRIMONIDINE TARTRATE 0.2 % OP SOLN
1.0000 [drp] | Freq: Two times a day (BID) | OPHTHALMIC | Status: DC
  Administered 2017-12-05 – 2017-12-08 (×4): 1 [drp] via OPHTHALMIC
  Filled 2017-12-05: qty 5

## 2017-12-05 MED ORDER — ACETAMINOPHEN 160 MG/5ML PO SOLN
500.0000 mg | Freq: Four times a day (QID) | ORAL | Status: DC | PRN
  Administered 2017-12-05 – 2017-12-06 (×2): 500 mg via ORAL
  Filled 2017-12-05: qty 20
  Filled 2017-12-05 (×4): qty 20.3
  Filled 2017-12-05: qty 20
  Filled 2017-12-05: qty 20.3

## 2017-12-05 NOTE — Progress Notes (Signed)
[facility] at [place] NAME: [name]    MR#:  [id number]  DATE OF BIRTH:  [birth year]  SUBJECTIVE:  CHIEF COMPLAINT:   Chief Complaint  Patient presents with  . Abdominal Pain  . Nausea  . Emesis  . Fever   Complains of headache. NGT in place No BM  And not passing gas  REVIEW OF SYSTEMS:    Review of Systems  Constitutional: Positive for malaise/fatigue. Negative for chills and fever.  HENT: Negative for sore throat.   Eyes: Negative for blurred vision, double vision and pain.  Respiratory: Negative for cough, hemoptysis, shortness of breath and wheezing.   Cardiovascular: Negative for chest pain, palpitations, orthopnea and leg swelling.  Gastrointestinal: Positive for abdominal pain and nausea. Negative for constipation, diarrhea, heartburn and vomiting.  Genitourinary: Negative for dysuria and hematuria.  Musculoskeletal: Negative for back pain and joint pain.  Skin: Negative for rash.  Neurological: Negative for sensory change, speech change, focal weakness and headaches.  Endo/Heme/Allergies: Does not bruise/bleed easily.  Psychiatric/Behavioral: Negative for depression. The patient is not nervous/anxious.     DRUG ALLERGIES:   Allergies  Allergen Reactions  . Iohexol Hives     Code: HIVES, Desc: PTS GRANDDAUGHTER [name] CALLED 12/31/08 TO NOTIFY us THAT HER GRANDMOTHER HAD A REACTION TO THE IV DYE 8 HRS POST CT EXAM PT DEVELOPED HIVES/RASH.12/31/08/RM, Onset Date: [id number]   . Morphine Other (See Comments)    confusion    VITALS:  Blood pressure (!) 156/87, pulse 64, temperature 99.3 F (37.4 C), temperature source Oral, resp. rate 16, height 5\' 7"  (1.702 m), weight 72.6 kg (160 lb), SpO2 91 %.  PHYSICAL EXAMINATION:   Physical Exam  GENERAL:  77 y.o.-year-old patient lying in the bed with no acute distress.  EYES: Pupils equal, round, reactive to light and accommodation. No scleral icterus. Extraocular muscles  intact.  HEENT: Head atraumatic, normocephalic. Oropharynx and nasopharynx clear.  NGT NECK:  Supple, no jugular venous distention. No thyroid enlargement, no tenderness.  LUNGS: Normal breath sounds bilaterally, no wheezing, rales, rhonchi. No use of accessory muscles of respiration.  CARDIOVASCULAR: S1, S2 normal. No murmurs, rubs, or gallops.  ABDOMEN: Soft, nontender, nondistended. Bowel sounds present. No organomegaly or mass.  EXTREMITIES: No cyanosis, clubbing or edema b/l.    NEUROLOGIC: Cranial nerves II through XII are intact. No focal Motor or sensory deficits b/l.   PSYCHIATRIC: The patient is alert and oriented x 3.  SKIN: No obvious rash, lesion, or ulcer.   LABORATORY PANEL:   CBC Recent Labs  Lab 12/05/17 0436  WBC 10.2  HGB 12.3  HCT 37.1  PLT 180   ------------------------------------------------------------------------------------------------------------------ Chemistries  Recent Labs  Lab 12/03/17 2023  12/05/17 0436  NA 143   < > 142  K 3.0*   < > 3.1*  CL 106   < > 108  CO2 25   < > 27  GLUCOSE 190*   < > 137*  BUN 13   < > 9  CREATININE 1.07*   < > 0.80  CALCIUM 9.6   < > 8.9  AST 28  --   --   ALT 18  --   --   ALKPHOS 76  --   --   BILITOT 1.2  --   --    < > = values in this interval not displayed.   ------------------------------------------------------------------------------------------------------------------  Cardiac Enzymes Recent Labs  Lab 12/03/17 2023  TROPONINI <0.03   ------------------------------------------------------------------------------------------------------------------  RADIOLOGY:  Ct Abdomen Pelvis Wo Contrast  Result Date: 12/03/2017 CLINICAL DATA:  Nausea, vomiting and fever since last night. EXAM: CT ABDOMEN AND PELVIS WITHOUT CONTRAST TECHNIQUE: Multidetector CT imaging of the abdomen and pelvis was performed following the standard protocol without IV contrast. COMPARISON:  CT abdomen and pelvis 03/14/2013.  FINDINGS: Lower chest: There is cardiomegaly. No pleural or pericardial effusion. Mild dependent atelectasis is seen in the lung bases. Hepatobiliary: Previously seen low attenuating lesion in the liver which had measured 4.4 cm AP x 4.4 cm transverse today measures 2.6 x 2.2 cm on axial image 31 and is most consistent with an involuting cyst. Status post cholecystectomy. No biliary dilatation. Pancreas: Unremarkable. No pancreatic ductal dilatation or surrounding inflammatory changes. Spleen: Normal in size without focal abnormality. Adrenals/Urinary Tract: Right adrenal adenoma is unchanged. The left adrenal gland is unremarkable. Bilateral renal cysts are seen and were present on the prior examination. Nonobstructing stone upper pole left kidney measures 0.4 cm. No hydronephrosis. No ureteral or urinary bladder stones. Stomach/Bowel: Large hiatal hernia is identified and was present on the prior examination. The patient is status post right hemicolectomy. Remaining colon is almost completely decompressed. Small bowel is dilated up to approximately 3.3 cm with short air-fluid levels present. Transition point is difficult to localize but appears to be in the anterior aspect of the inferior pelvis where loops appear adhesed together. No pneumatosis, portal venous gas or free intraperitoneal air is identified. There is a small volume of abdominal and pelvic ascites. No organized fluid collection. Vascular/Lymphatic: Aortic atherosclerosis. No enlarged abdominal or pelvic lymph nodes. Reproductive: Small calcified uterine fibroid noted. No adnexal mass. Other: Small fat containing umbilical hernia is identified. Musculoskeletal: No acute or focal abnormality. IMPRESSION: The examination is positive for small bowel obstruction which is likely due to adhesions. Transition point appears to be in the inferior aspect of the pelvis but is difficult to localize. Negative for evidence bowel ischemia. Small volume of abdominal  and pelvic ascites is likely related to obstruction. Large hiatal hernia. Small nonobstructing stone left kidney. Atherosclerosis. Status post right hemicolectomy. Electronically Signed   By: Inge Rise M.D.   On: 12/03/2017 21:46   Dg Abd 1 View  Result Date: 12/04/2017 CLINICAL DATA:  NG tube placement EXAM: ABDOMEN - 1 VIEW COMPARISON:  12/04/2017, CT 12/03/2017 FINDINGS: Esophageal tube tip projects over the gastric body with large hiatal hernia noted. Dilated loops of small bowel in the central abdomen consistent with a bowel obstruction. Atelectasis at the left base. IMPRESSION: Esophageal tube tip overlies the gastric body; a hiatal hernia containing the stomach is noted. Electronically Signed   By: Donavan Foil M.D.   On: 12/04/2017 02:17   Dg Abd 1 View  Result Date: 12/04/2017 CLINICAL DATA:  NG tube placement EXAM: ABDOMEN - 1 VIEW COMPARISON:  CT abdomen pelvis 12/03/2017 FINDINGS: There is a large hiatal hernia with the tip and side port of the nasogastric tube projecting over the intrathoracic portion of the stomach. Bibasilar atelectasis. Dilated small bowel seen in the abdomen. IMPRESSION: Large hiatal hernia with tip of nasogastric tube within the intrathoracic portion of the stomach. Electronically Signed   By: Ulyses Jarred M.D.   On: 12/04/2017 01:32   Dg Abd Acute W/chest  Result Date: 12/05/2017 CLINICAL DATA:  77 year old female with small bowel obstruction. Colon cancer status post right hemicolectomy. EXAM: DG ABDOMEN ACUTE W/ 1V CHEST COMPARISON:  12/04/2017, CT Abdomen and Pelvis 12/03/2017. FINDINGS: AP upright views of the  chest and abdomen, with supine view of the abdomen and pelvis. Enteric feeding tube remains within a large gastric hiatal hernia. Stable cardiac size and mediastinal contours. Tortuous thoracic aorta. Stable lung volumes and patchy lung base opacity. No pneumothorax or pneumoperitoneum. Multiple surgical clips in the right upper abdomen and staple lines  re- demonstrated. Mildly decreased gas distended small bowel loops in the mid abdomen, now measuring 33 millimeters diameter (up to 40 millimeters yesterday). Persistent cage in all small bowel air-fluid levels. Increased gas in the rectum. Stable abdominal visceral contours. Stable visualized osseous structures. IMPRESSION: 1. Mildly improved bowel gas pattern since yesterday. Borderline to mildly dilated mid abdominal small bowel. 2. No pneumoperitoneum. 3. Stable enteric feeding tube within the large hiatal hernia. 4. Stable lung base atelectasis. Electronically Signed   By: Genevie Ann M.D.   On: 12/05/2017 08:59   Dg Abd Acute W/chest  Result Date: 12/04/2017 CLINICAL DATA:  Small bowel obstruction EXAM: DG ABDOMEN ACUTE W/ 1V CHEST COMPARISON:  CT 12/03/2017 FINDINGS: NG tube is within the large hiatal hernia. Continued dilated small bowel loops compatible with small bowel obstruction. Prior cholecystectomy. No free air or organomegaly. Cardiomegaly with vascular congestion. Tortuous and ectatic thoracic aorta. Bibasilar atelectasis noted. IMPRESSION: Continued small bowel obstruction pattern, similar to prior studies. NG tube within a large hiatal hernia. Cardiomegaly, bibasilar atelectasis and vascular congestion. Electronically Signed   By: Rolm Baptise M.D.   On: 12/04/2017 09:07     ASSESSMENT AND PLAN:    * SBO (small bowel obstruction) NG tube for gastric decompression Per primary team   * Anxiety - home dose anxiolytic   * Depression - home dose antidepressant   * Essential hypertension - home dose antihypertensives Added Iv vasotec   * Hyperlipidemia - Home dose antilipid  * Mild headache Changed to liquid tylenol  All the records are reviewed and case discussed with Care Management/Social Workerr. Management plans discussed with the patient, family and they are in agreement.  CODE STATUS: FULL CODE  DVT Prophylaxis: SCDs  TOTAL TIME TAKING CARE OF THIS PATIENT: 30 minutes.    POSSIBLE D/C IN 2-3 DAYS, DEPENDING ON CLINICAL CONDITION.  Leia Alf Oshua Mcconaha M.D on 12/05/2017 at 9:42 AM  Between 7am to 6pm - Pager - [phone]  After 6pm go to [email or web address] - password [email or web address] [facility]  Office  [phone]  CC: Primary care physician; Denton Lank, MD  Note: This dictation was prepared with Dragon dictation along with smaller phrase technology. Any transcriptional errors that result from this process are unintentional.

## 2017-12-05 NOTE — Progress Notes (Signed)
CC: SBO Subjective: Feeling better, no flatus, no abdominal pain NGT 80cc KUB personally reviewed, now there is gas in the colon and rectum, improved dilation of SB loops, no free air K still low  Objective: Vital signs in last 24 hours: Temp:  [98 F (36.7 C)-99.3 F (37.4 C)] 99.3 F (37.4 C) (07/03 0529) Pulse Rate:  [64-66] 64 (07/03 0529) Resp:  [16-20] 16 (07/03 0529) BP: (145-160)/(85-104) 156/87 (07/03 0529) SpO2:  [91 %-97 %] 91 % (07/03 0529) Last BM Date: 12/01/17  Intake/Output from previous day: 07/02 0701 - 07/03 0700 In: 3225.8 [I.V.:2927.8; IV Piggyback:298] Out: 1500 [Urine:1350; Emesis/NG output:150] Intake/Output this shift: Total I/O In: 2031 [I.V.:1926; IV Piggyback:105] Out: 480 [Urine:400; Emesis/NG output:80]  Physical exam: NAD, awake and alert Abd: soft, decrease bs, non tendner and no peritonitis. Ext: no edema and well perfused  Lab Results: CBC  Recent Labs    12/04/17 0553 12/05/17 0436  WBC 11.3* 10.2  HGB 13.3 12.3  HCT 39.6 37.1  PLT 181 180   BMET Recent Labs    12/04/17 0553 12/05/17 0436  NA 144 142  K 2.8* 3.1*  CL 107 108  CO2 27 27  GLUCOSE 140* 137*  BUN 14 9  CREATININE 0.90 0.80  CALCIUM 9.0 8.9   PT/INR No results for input(s): LABPROT, INR in the last 72 hours. ABG No results for input(s): PHART, HCO3 in the last 72 hours.  Invalid input(s): PCO2, PO2  Studies/Results: Ct Abdomen Pelvis Wo Contrast  Result Date: 12/03/2017 CLINICAL DATA:  Nausea, vomiting and fever since last night. EXAM: CT ABDOMEN AND PELVIS WITHOUT CONTRAST TECHNIQUE: Multidetector CT imaging of the abdomen and pelvis was performed following the standard protocol without IV contrast. COMPARISON:  CT abdomen and pelvis 03/14/2013. FINDINGS: Lower chest: There is cardiomegaly. No pleural or pericardial effusion. Mild dependent atelectasis is seen in the lung bases. Hepatobiliary: Previously seen low attenuating lesion in the liver which  had measured 4.4 cm AP x 4.4 cm transverse today measures 2.6 x 2.2 cm on axial image 31 and is most consistent with an involuting cyst. Status post cholecystectomy. No biliary dilatation. Pancreas: Unremarkable. No pancreatic ductal dilatation or surrounding inflammatory changes. Spleen: Normal in size without focal abnormality. Adrenals/Urinary Tract: Right adrenal adenoma is unchanged. The left adrenal gland is unremarkable. Bilateral renal cysts are seen and were present on the prior examination. Nonobstructing stone upper pole left kidney measures 0.4 cm. No hydronephrosis. No ureteral or urinary bladder stones. Stomach/Bowel: Large hiatal hernia is identified and was present on the prior examination. The patient is status post right hemicolectomy. Remaining colon is almost completely decompressed. Small bowel is dilated up to approximately 3.3 cm with short air-fluid levels present. Transition point is difficult to localize but appears to be in the anterior aspect of the inferior pelvis where loops appear adhesed together. No pneumatosis, portal venous gas or free intraperitoneal air is identified. There is a small volume of abdominal and pelvic ascites. No organized fluid collection. Vascular/Lymphatic: Aortic atherosclerosis. No enlarged abdominal or pelvic lymph nodes. Reproductive: Small calcified uterine fibroid noted. No adnexal mass. Other: Small fat containing umbilical hernia is identified. Musculoskeletal: No acute or focal abnormality. IMPRESSION: The examination is positive for small bowel obstruction which is likely due to adhesions. Transition point appears to be in the inferior aspect of the pelvis but is difficult to localize. Negative for evidence bowel ischemia. Small volume of abdominal and pelvic ascites is likely related to obstruction. Large hiatal hernia.  Small nonobstructing stone left kidney. Atherosclerosis. Status post right hemicolectomy. Electronically Signed   By: Inge Rise  M.D.   On: 12/03/2017 21:46   Dg Abd 1 View  Result Date: 12/04/2017 CLINICAL DATA:  NG tube placement EXAM: ABDOMEN - 1 VIEW COMPARISON:  12/04/2017, CT 12/03/2017 FINDINGS: Esophageal tube tip projects over the gastric body with large hiatal hernia noted. Dilated loops of small bowel in the central abdomen consistent with a bowel obstruction. Atelectasis at the left base. IMPRESSION: Esophageal tube tip overlies the gastric body; a hiatal hernia containing the stomach is noted. Electronically Signed   By: Donavan Foil M.D.   On: 12/04/2017 02:17   Dg Abd 1 View  Result Date: 12/04/2017 CLINICAL DATA:  NG tube placement EXAM: ABDOMEN - 1 VIEW COMPARISON:  CT abdomen pelvis 12/03/2017 FINDINGS: There is a large hiatal hernia with the tip and side port of the nasogastric tube projecting over the intrathoracic portion of the stomach. Bibasilar atelectasis. Dilated small bowel seen in the abdomen. IMPRESSION: Large hiatal hernia with tip of nasogastric tube within the intrathoracic portion of the stomach. Electronically Signed   By: Ulyses Jarred M.D.   On: 12/04/2017 01:32   Dg Abd Acute W/chest  Result Date: 12/05/2017 CLINICAL DATA:  77 year old female with small bowel obstruction. Colon cancer status post right hemicolectomy. EXAM: DG ABDOMEN ACUTE W/ 1V CHEST COMPARISON:  12/04/2017, CT Abdomen and Pelvis 12/03/2017. FINDINGS: AP upright views of the chest and abdomen, with supine view of the abdomen and pelvis. Enteric feeding tube remains within a large gastric hiatal hernia. Stable cardiac size and mediastinal contours. Tortuous thoracic aorta. Stable lung volumes and patchy lung base opacity. No pneumothorax or pneumoperitoneum. Multiple surgical clips in the right upper abdomen and staple lines re- demonstrated. Mildly decreased gas distended small bowel loops in the mid abdomen, now measuring 33 millimeters diameter (up to 40 millimeters yesterday). Persistent cage in all small bowel air-fluid  levels. Increased gas in the rectum. Stable abdominal visceral contours. Stable visualized osseous structures. IMPRESSION: 1. Mildly improved bowel gas pattern since yesterday. Borderline to mildly dilated mid abdominal small bowel. 2. No pneumoperitoneum. 3. Stable enteric feeding tube within the large hiatal hernia. 4. Stable lung base atelectasis. Electronically Signed   By: Genevie Ann M.D.   On: 12/05/2017 08:59   Dg Abd Acute W/chest  Result Date: 12/04/2017 CLINICAL DATA:  Small bowel obstruction EXAM: DG ABDOMEN ACUTE W/ 1V CHEST COMPARISON:  CT 12/03/2017 FINDINGS: NG tube is within the large hiatal hernia. Continued dilated small bowel loops compatible with small bowel obstruction. Prior cholecystectomy. No free air or organomegaly. Cardiomegaly with vascular congestion. Tortuous and ectatic thoracic aorta. Bibasilar atelectasis noted. IMPRESSION: Continued small bowel obstruction pattern, similar to prior studies. NG tube within a large hiatal hernia. Cardiomegaly, bibasilar atelectasis and vascular congestion. Electronically Signed   By: Rolm Baptise M.D.   On: 12/04/2017 09:07    Anti-infectives: Anti-infectives (From admission, onward)   None      Assessment/Plan:  SBO slowly improving continue NGT and NPO Replace k, recheck mag No surgical intervention mobilize  Caroleen Hamman, MD, [facility]  12/05/2017

## 2017-12-06 ENCOUNTER — Inpatient Hospital Stay: Payer: Medicare Other

## 2017-12-06 DIAGNOSIS — K5652 Intestinal adhesions [bands] with complete obstruction: Secondary | ICD-10-CM

## 2017-12-06 LAB — BASIC METABOLIC PANEL
Anion gap: 8 (ref 5–15)
BUN: 6 mg/dL — ABNORMAL LOW (ref 8–23)
CHLORIDE: 103 mmol/L (ref 98–111)
CO2: 31 mmol/L (ref 22–32)
CREATININE: 0.66 mg/dL (ref 0.44–1.00)
Calcium: 8.9 mg/dL (ref 8.9–10.3)
GFR calc non Af Amer: >60 mL/min (ref >60)
Glucose, Bld: 134 mg/dL — ABNORMAL HIGH (ref 70–99)
POTASSIUM: 2.9 mmol/L — AB (ref 3.5–5.1)
SODIUM: 142 mmol/L (ref 135–145)

## 2017-12-06 LAB — CBC
HEMATOCRIT: 36 % (ref 35.0–47.0)
Hemoglobin: 12.1 g/dL (ref 12.0–16.0)
MCH: 30.3 pg (ref 26.0–34.0)
MCHC: 33.6 g/dL (ref 32.0–36.0)
MCV: 90 fL (ref 80.0–100.0)
Platelets: 155 10*3/uL (ref 150–440)
RBC: 4 MIL/uL (ref 3.80–5.20)
RDW: 15.4 % — ABNORMAL HIGH (ref 11.5–14.5)
WBC: 8.7 10*3/uL (ref 3.6–11.0)

## 2017-12-06 LAB — MAGNESIUM: MAGNESIUM: 1.5 mg/dL — AB (ref 1.7–2.4)

## 2017-12-06 MED ORDER — KCL IN DEXTROSE-NACL 20-5-0.45 MEQ/L-%-% IV SOLN
INTRAVENOUS | Status: DC
  Administered 2017-12-06 – 2017-12-08 (×5): via INTRAVENOUS
  Filled 2017-12-06 (×6): qty 1000

## 2017-12-06 MED ORDER — POTASSIUM CHLORIDE 10 MEQ/100ML IV SOLN
10.0000 meq | INTRAVENOUS | Status: AC
  Administered 2017-12-06 (×5): 10 meq via INTRAVENOUS
  Filled 2017-12-06 (×3): qty 100

## 2017-12-06 MED ORDER — MAGNESIUM SULFATE 2 GM/50ML IV SOLN
2.0000 g | Freq: Once | INTRAVENOUS | Status: AC
  Administered 2017-12-06: 2 g via INTRAVENOUS
  Filled 2017-12-06: qty 50

## 2017-12-06 NOTE — Progress Notes (Signed)
[facility] [place] NAME: Bryannah Boston    MR#:  [id number]  DATE OF BIRTH:  1940-12-16  SUBJECTIVE:  CHIEF COMPLAINT:   Chief Complaint  Patient presents with  . Abdominal Pain  . Nausea  . Emesis  . Fever  Patient feeling better, no events overnight, NG tube still in place  REVIEW OF SYSTEMS:  CONSTITUTIONAL: No fever, fatigue or weakness.  EYES: No blurred or double vision.  EARS, NOSE, AND THROAT: No tinnitus or ear pain.  RESPIRATORY: No cough, shortness of breath, wheezing or hemoptysis.  CARDIOVASCULAR: No chest pain, orthopnea, edema.  GASTROINTESTINAL: No nausea, vomiting, diarrhea or abdominal pain.  GENITOURINARY: No dysuria, hematuria.  ENDOCRINE: No polyuria, nocturia,  HEMATOLOGY: No anemia, easy bruising or bleeding SKIN: No rash or lesion. MUSCULOSKELETAL: No joint pain or arthritis.   NEUROLOGIC: No tingling, numbness, weakness.  PSYCHIATRY: No anxiety or depression.   ROS  DRUG ALLERGIES:   Allergies  Allergen Reactions  . Iohexol Hives     Code: HIVES, Desc: PTS GRANDDAUGHTER [name] CALLED 12/31/08 TO NOTIFY us THAT HER GRANDMOTHER HAD A REACTION TO THE IV DYE 8 HRS POST CT EXAM PT DEVELOPED HIVES/RASH.12/31/08/RM, Onset Date: [id number]   . Morphine Other (See Comments)    confusion    VITALS:  Blood pressure (!) 154/88, pulse 64, temperature 100.2 F (37.9 C), temperature source Oral, resp. rate 18, height 5\' 7"  (1.702 m), weight 72.6 kg (160 lb), SpO2 95 %.  PHYSICAL EXAMINATION:  GENERAL:  77 y.o.-year-old patient lying in the bed with no acute distress.  EYES: Pupils equal, round, reactive to light and accommodation. No scleral icterus. Extraocular muscles intact.  HEENT: Head atraumatic, normocephalic. Oropharynx and nasopharynx clear.  NECK:  Supple, no jugular venous distention. No thyroid enlargement, no tenderness.  LUNGS: Normal breath sounds bilaterally, no wheezing, rales,rhonchi or  crepitation. No use of accessory muscles of respiration.  CARDIOVASCULAR: S1, S2 normal. No murmurs, rubs, or gallops.  ABDOMEN: Soft, nontender, nondistended. Bowel sounds present. No organomegaly or mass.  EXTREMITIES: No pedal edema, cyanosis, or clubbing.  NEUROLOGIC: Cranial nerves II through XII are intact. Muscle strength 5/5 in all extremities. Sensation intact. Gait not checked.  PSYCHIATRIC: The patient is alert and oriented x 3.  SKIN: No obvious rash, lesion, or ulcer.   Physical Exam LABORATORY PANEL:   CBC Recent Labs  Lab 12/06/17 0510  WBC 8.7  HGB 12.1  HCT 36.0  PLT 155   ------------------------------------------------------------------------------------------------------------------  Chemistries  Recent Labs  Lab 12/03/17 2023  12/06/17 0510  NA 143   < > 142  K 3.0*   < > 2.9*  CL 106   < > 103  CO2 25   < > 31  GLUCOSE 190*   < > 134*  BUN 13   < > 6*  CREATININE 1.07*   < > 0.66  CALCIUM 9.6   < > 8.9  MG  --   --  1.5*  AST 28  --   --   ALT 18  --   --   ALKPHOS 76  --   --   BILITOT 1.2  --   --    < > = values in this interval not displayed.   ------------------------------------------------------------------------------------------------------------------  Cardiac Enzymes Recent Labs  Lab 12/03/17 2023  TROPONINI <0.03   ------------------------------------------------------------------------------------------------------------------  RADIOLOGY:  Dg Abd Acute W/chest  Result Date: 12/06/2017 CLINICAL DATA:  Small-bowel obstruction EXAM: DG ABDOMEN ACUTE  W/ 1V CHEST COMPARISON:  12/05/2017 FINDINGS: A few mildly dilated loops of small bowel are identified. Nasogastric catheter remains in the stomach. No free air is seen. Persistent bibasilar changes are noted in the lungs. No acute bony abnormality is seen. Cardiac shadow is stable. 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 IMPRESSION: Mild small bowel dilatation stable from the previous  exam. Nasogastric catheter stable from the prior study. Electronically Signed   By: Inez Catalina M.D.   On: 12/06/2017 08:00   Dg Abd Acute W/chest  Result Date: 12/05/2017 CLINICAL DATA:  77 year old female with small bowel obstruction. Colon cancer status post right hemicolectomy. EXAM: DG ABDOMEN ACUTE W/ 1V CHEST COMPARISON:  12/04/2017, CT Abdomen and Pelvis 12/03/2017. FINDINGS: AP upright views of the chest and abdomen, with supine view of the abdomen and pelvis. Enteric feeding tube remains within a large gastric hiatal hernia. Stable cardiac size and mediastinal contours. Tortuous thoracic aorta. Stable lung volumes and patchy lung base opacity. No pneumothorax or pneumoperitoneum. Multiple surgical clips in the right upper abdomen and staple lines re- demonstrated. Mildly decreased gas distended small bowel loops in the mid abdomen, now measuring 33 millimeters diameter (up to 40 millimeters yesterday). Persistent cage in all small bowel air-fluid levels. Increased gas in the rectum. Stable abdominal visceral contours. Stable visualized osseous structures. IMPRESSION: 1. Mildly improved bowel gas pattern since yesterday. Borderline to mildly dilated mid abdominal small bowel. 2. No pneumoperitoneum. 3. Stable enteric feeding tube within the large hiatal hernia. 4. Stable lung base atelectasis. Electronically Signed   By: Genevie Ann M.D.   On: 12/05/2017 08:59    ASSESSMENT AND PLAN:  *Acute SBO  Resolving NGT for gastric decompression Treatment plan per primary service  *Acute hypomagnesemia/hypokalemia Replete with IV potassium/magnesium, BMP in the morning  *Chronicaxiety Stable on current regiment  *Chronicdpression  Stable on current regiment  *Essential hypertension Stable on current regiment  *Hyperlipidemia Stable  * Mild headache Resolved Continue Tylenol as needed  All the records are reviewed and case discussed with Care Management/Social Workerr. Management  plans discussed with the patient, family and they are in agreement.  CODE STATUS: full TOTAL TIME TAKING CARE OF THIS PATIENT: 35 minutes.     POSSIBLE D/C IN 1-3 DAYS, DEPENDING ON CLINICAL CONDITION.   Avel Peace Hetty Linhart M.D on 12/06/2017   Between 7am to 6pm - Pager - [phone]  After 6pm go to [email or web address] - password [email or web address] [facility]  Office  [phone]  CC: Primary care physician; Denton Lank, MD  Note: This dictation was prepared with Dragon dictation along with smaller phrase technology. Any transcriptional errors that result from this process are unintentional.

## 2017-12-06 NOTE — Care Management Important Message (Signed)
Important Message  Patient Details  Name: [name] MRN: [id number] Date of Birth: Sep 27, 1940   Medicare Important Message Given:  Yes    Juliann Pulse A Melisha Eggleton 12/06/2017, 9:33 AM

## 2017-12-06 NOTE — Plan of Care (Signed)
  Problem: Activity: Goal: Risk for activity intolerance will decrease Outcome: Progressing, Pt walked one lap around nursing station. Pt was up to chair twice.  Problem: Elimination: Goal: Will not experience complications related to bowel motility Outcome: Progressing. Pt passed gas several times today.

## 2017-12-06 NOTE — Progress Notes (Signed)
SURGICAL PROGRESS NOTE (cpt [phone])  Hospital Day(s): 3.   Post op day(s):  Marland Kitchen   Interval History: Patient seen and examined, no acute events or new complaints overnight. Patient reports decreased but unresolved abdominal distention following a single episode of flatus today and possibly twice yesterday and a small BM. Patient otherwise denies abdominal pain, N/V, fever/chills, CP, or SOB and has not been ambulating.  Review of Systems:  Constitutional: denies fever, chills  HEENT: denies cough or congestion  Respiratory: denies any shortness of breath  Cardiovascular: denies chest pain or palpitations  Gastrointestinal: abdominal pain, N/V, and bowel function as per interval history Genitourinary: denies burning with urination or urinary frequency Musculoskeletal: denies pain, decreased motor or sensation Integumentary: denies any other rashes or skin discolorations Neurological: denies HA or vision/hearing changes   Vital signs in last 24 hours: [min-max] current  Temp:  [99 F (37.2 C)-100.2 F (37.9 C)] 100.2 F (37.9 C) (07/04 0300) Pulse Rate:  [50-66] 64 (07/04 0300) Resp:  [18-20] 18 (07/04 0300) BP: (121-156)/(79-111) 154/88 (07/04 0300) SpO2:  [92 %-95 %] 95 % (07/04 0300)     Height: 5\' 7"  (170.2 cm) Weight: 160 lb (72.6 kg) BMI (Calculated): 25.05   Intake/Output this shift:  Total I/O In: -  Out: 500 [Urine:500]   Intake/Output last 2 shifts:  @IOLAST2SHIFTS @   Physical Exam:  Constitutional: alert, cooperative and no distress  HENT: normocephalic without obvious abnormality  Eyes: PERRL, EOM's grossly intact and symmetric  Neuro: CN II - XII grossly intact and symmetric without deficit  Respiratory: breathing non-labored at rest  Cardiovascular: regular rate and sinus rhythm  Gastrointestinal: soft and non-tender with mild-/moderate- abdominal distention Musculoskeletal: UE and LE FROM, no edema or wounds, motor and sensation grossly intact, NT   Labs:   CBC Latest Ref Rng & Units 12/06/2017 12/05/2017 12/04/2017  WBC 3.6 - 11.0 K/uL 8.7 10.2 11.3(H)  Hemoglobin 12.0 - 16.0 g/dL 12.1 12.3 13.3  Hematocrit 35.0 - 47.0 % 36.0 37.1 39.6  Platelets 150 - 440 K/uL 155 180 181   CMP Latest Ref Rng & Units 12/06/2017 12/05/2017 12/04/2017  Glucose 70 - 99 mg/dL 134(H) 137(H) 140(H)  BUN 8 - 23 mg/dL 6(L) 9 14  Creatinine 0.44 - 1.00 mg/dL 0.66 0.80 0.90  Sodium 135 - 145 mmol/L 142 142 144  Potassium 3.5 - 5.1 mmol/L 2.9(L) 3.1(L) 2.8(L)  Chloride 98 - 111 mmol/L 103 108 107  CO2 22 - 32 mmol/L 31 27 27   Calcium 8.9 - 10.3 mg/dL 8.9 8.9 9.0  Total Protein 6.5 - 8.1 g/dL - - -  Total Bilirubin 0.3 - 1.2 mg/dL - - -  Alkaline Phos 38 - 126 U/L - - -  AST 15 - 41 U/L - - -  ALT 0 - 44 U/L - - -   Imaging studies:  Abdominal X-ray (12/06/2017) - personally reviewed and discussed with patient A few mildly dilated loops of small bowel are identified. Nasogastric catheter remains in the stomach. No free air is seen. Persistent bibasilar changes are noted in the lungs. No acute bony abnormality is seen. Cardiac shadow is stable. 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  Assessment/Plan: (ICD-10's: K43.52) 77 y.o. female with what appears to be resolving complete small bowel obstruction, likely attributable to post-surgical adhesions following Right colectomy, esophagectomy, and gastrectomy, complicated by pertinent comorbidities including HTN, HLD, GERD, nephrolithiasis, osteoarthritis, chronic headaches (including migraines per patient), unilateral blindness; cancers of esophagus, colon, and breast; generalized anxiety disorder,  and major depression disorder.  - NPO for now, IV fluids             - NG tube for nasogastric decompression             - monitor ongoing bowel function and abdominal exam              - anticipate symptomatic relief within 24 - 48 hours following NGT insertion, followed by "rumbling" the following day and flatus either  the same day or the day following the "rumbling" with anticipated length of stay ~3 - 5 days with successful non-operative management for 8 of 10 patients with small bowel obstruction attributed to post-surgical adhesions  - surgical intervention if doesn't improve was also discussed             - medical management comorbidities as per medical team             - DVT prophylaxis, ambulation encouraged   All of the above findings and recommendations were discussed with the patient, patient's family, and the medical team, and all of patient's and family's questions were answered to their expressed satisfaction.  -- Marilynne Drivers Rosana Hoes, MD, [place]: [facility] - Partnering for exceptional care. Office: [phone]

## 2017-12-07 LAB — BASIC METABOLIC PANEL
Anion gap: 6 (ref 5–15)
BUN: 5 mg/dL — AB (ref 8–23)
CALCIUM: 8.8 mg/dL — AB (ref 8.9–10.3)
CO2: 29 mmol/L (ref 22–32)
CREATININE: 0.74 mg/dL (ref 0.44–1.00)
Chloride: 106 mmol/L (ref 98–111)
GFR calc non Af Amer: >60 mL/min (ref >60)
Glucose, Bld: 126 mg/dL — ABNORMAL HIGH (ref 70–99)
Potassium: 3.3 mmol/L — ABNORMAL LOW (ref 3.5–5.1)
SODIUM: 141 mmol/L (ref 135–145)

## 2017-12-07 LAB — MAGNESIUM: MAGNESIUM: 1.9 mg/dL (ref 1.7–2.4)

## 2017-12-07 MED ORDER — POTASSIUM CHLORIDE 10 MEQ/100ML IV SOLN
10.0000 meq | INTRAVENOUS | Status: AC
  Filled 2017-12-07: qty 100

## 2017-12-07 MED ORDER — POTASSIUM CHLORIDE 10 MEQ/100ML IV SOLN
10.0000 meq | INTRAVENOUS | Status: AC
  Administered 2017-12-07 (×2): 10 meq via INTRAVENOUS
  Filled 2017-12-07 (×3): qty 100

## 2017-12-07 MED ORDER — SIMVASTATIN 20 MG PO TABS
20.0000 mg | ORAL_TABLET | Freq: Every day | ORAL | Status: DC
  Administered 2017-12-07: 20 mg via ORAL
  Filled 2017-12-07 (×2): qty 1

## 2017-12-07 MED ORDER — PANTOPRAZOLE SODIUM 40 MG PO TBEC
40.0000 mg | DELAYED_RELEASE_TABLET | Freq: Every day | ORAL | Status: DC
  Administered 2017-12-07 – 2017-12-08 (×2): 40 mg via ORAL
  Filled 2017-12-07 (×2): qty 1

## 2017-12-07 MED ORDER — ASPIRIN EC 81 MG PO TBEC
81.0000 mg | DELAYED_RELEASE_TABLET | Freq: Every day | ORAL | Status: DC
  Administered 2017-12-07 – 2017-12-08 (×2): 81 mg via ORAL
  Filled 2017-12-07 (×2): qty 1

## 2017-12-07 MED ORDER — SERTRALINE HCL 50 MG PO TABS
150.0000 mg | ORAL_TABLET | Freq: Every day | ORAL | Status: DC
  Administered 2017-12-07 – 2017-12-08 (×2): 150 mg via ORAL
  Filled 2017-12-07 (×2): qty 3

## 2017-12-07 MED ORDER — VITAMIN B-12 1000 MCG PO TABS
500.0000 ug | ORAL_TABLET | Freq: Two times a day (BID) | ORAL | Status: DC
  Administered 2017-12-07 – 2017-12-08 (×2): 500 ug via ORAL
  Filled 2017-12-07 (×2): qty 1

## 2017-12-07 MED ORDER — BOOST / RESOURCE BREEZE PO LIQD CUSTOM
1.0000 | Freq: Three times a day (TID) | ORAL | Status: DC
Start: 2017-12-07 — End: 2017-12-08
  Administered 2017-12-07 (×2): 1 via ORAL

## 2017-12-07 MED ORDER — POTASSIUM CHLORIDE CRYS ER 10 MEQ PO TBCR: 10.0000 meq | EXTENDED_RELEASE_TABLET | Freq: Two times a day (BID) | ORAL | Status: DC

## 2017-12-07 NOTE — Progress Notes (Signed)
SURGICAL PROGRESS NOTE (cpt [phone])  Hospital Day(s): 4.   Post op day(s):  Marland Kitchen   Interval History: Patient seen and examined, no acute events or new complaints overnight. Patient reports more consistent +flatus, denies abdominal distention, abdominal pain, or nausea with decreased primarily clear drainage from NG tube overnight. Patient also states she is hungry and would like to try eating/drinking. She has also ambulated in the halls since yesterday.  Review of Systems:  Constitutional: denies fever, chills  HEENT: denies cough or congestion  Respiratory: denies any shortness of breath  Cardiovascular: denies chest pain or palpitations  Gastrointestinal: abdominal pain, N/V, and bowel function as per interval history Genitourinary: denies burning with urination or urinary frequency Musculoskeletal: denies pain, decreased motor or sensation Integumentary: denies any other rashes or skin discolorations Neurological: denies HA or vision/hearing changes   Vital signs in last 24 hours: [min-max] current  Temp:  [99.2 F (37.3 C)-99.5 F (37.5 C)] 99.2 F (37.3 C) (07/05 0556) Pulse Rate:  [55-63] 56 (07/05 0556) Resp:  [18-20] 20 (07/05 0556) BP: (147-163)/(89-105) 147/91 (07/05 0556) SpO2:  [93 %-94 %] 94 % (07/05 0556)     Height: 5\' 7"  (170.2 cm) Weight: 160 lb (72.6 kg) BMI (Calculated): 25.05   Intake/Output this shift:  No intake/output data recorded.   Intake/Output last 2 shifts:  @IOLAST2SHIFTS @   Physical Exam:  Constitutional: alert, cooperative and no distress  HENT: normocephalic without obvious abnormality  Eyes: PERRL, EOM's grossly intact and symmetric  Neuro: CN II - XII grossly intact and symmetric without deficit  Respiratory: breathing non-labored at rest  Cardiovascular: regular rate and sinus rhythm  Gastrointestinal: soft, non-tender, and non-distended Musculoskeletal: UE and LE FROM, no edema or wounds, motor and sensation grossly intact, NT   Labs:   CBC Latest Ref Rng & Units 12/06/2017 12/05/2017 12/04/2017  WBC 3.6 - 11.0 K/uL 8.7 10.2 11.3(H)  Hemoglobin 12.0 - 16.0 g/dL 12.1 12.3 13.3  Hematocrit 35.0 - 47.0 % 36.0 37.1 39.6  Platelets 150 - 440 K/uL 155 180 181   CMP Latest Ref Rng & Units 12/07/2017 12/06/2017 12/05/2017  Glucose 70 - 99 mg/dL 126(H) 134(H) 137(H)  BUN 8 - 23 mg/dL 5(L) 6(L) 9  Creatinine 0.44 - 1.00 mg/dL 0.74 0.66 0.80  Sodium 135 - 145 mmol/L 141 142 142  Potassium 3.5 - 5.1 mmol/L 3.3(L) 2.9(L) 3.1(L)  Chloride 98 - 111 mmol/L 106 103 108  CO2 22 - 32 mmol/L 29 31 27   Calcium 8.9 - 10.3 mg/dL 8.8(L) 8.9 8.9  Total Protein 6.5 - 8.1 g/dL - - -  Total Bilirubin 0.3 - 1.2 mg/dL - - -  Alkaline Phos 38 - 126 U/L - - -  AST 15 - 41 U/L - - -  ALT 0 - 44 U/L - - -   Imaging studies: No new pertinent imaging studies   Assessment/Plan: (ICD-10's: K70.52) 77 y.o. femalewith what appears to be resolving complete small bowel obstruction, likely attributable to post-surgical adhesions following Right colectomy, esophagectomy, and gastrectomy, complicated by pertinent comorbidities including HTN, HLD, GERD, nephrolithiasis, osteoarthritis, chronic headaches (including migraines per patient), unilateral blindness; cancers of esophagus, colon, and breast; generalized anxiety disorder, and major depression disorder.  - NG tube removed - monitor ongoing bowel function and abdominal exam  - will order clear liquids diet, advance to full liquids in pm if tolerated             - surgical intervention if doesn't improve was also discussed -  medical management comorbidities as per medical team - DVT prophylaxis, ambulation encouraged  All of the above findings and recommendations were discussed with the patient, her RN, and Dr. Jerelyn Charles, and all of patient's questions were answered to her expressed satisfaction.  -- Marilynne Drivers Rosana Hoes, MD, [place]: [facility] - Partnering for exceptional care. Office: [phone]

## 2017-12-07 NOTE — Progress Notes (Signed)
[facility] [place] NAME: [name]    MR#:  [id number]  DATE OF BIRTH:  30-Nov-1940  SUBJECTIVE:  CHIEF COMPLAINT:   Chief Complaint  Patient presents with  . Abdominal Pain  . Nausea  . Emesis  . Fever  no complaints  REVIEW OF SYSTEMS:  CONSTITUTIONAL: No fever, fatigue or weakness.  EYES: No blurred or double vision.  EARS, NOSE, AND THROAT: No tinnitus or ear pain.  RESPIRATORY: No cough, shortness of breath, wheezing or hemoptysis.  CARDIOVASCULAR: No chest pain, orthopnea, edema.  GASTROINTESTINAL: No nausea, vomiting, diarrhea or abdominal pain.  GENITOURINARY: No dysuria, hematuria.  ENDOCRINE: No polyuria, nocturia,  HEMATOLOGY: No anemia, easy bruising or bleeding SKIN: No rash or lesion. MUSCULOSKELETAL: No joint pain or arthritis.   NEUROLOGIC: No tingling, numbness, weakness.  PSYCHIATRY: No anxiety or depression.   ROS  DRUG ALLERGIES:   Allergies  Allergen Reactions  . Iohexol Hives     Code: HIVES, Desc: PTS GRANDDAUGHTER [name] CALLED 12/31/08 TO NOTIFY us THAT HER GRANDMOTHER HAD A REACTION TO THE IV DYE 8 HRS POST CT EXAM PT DEVELOPED HIVES/RASH.12/31/08/RM, Onset Date: [id number]   . Morphine Other (See Comments)    confusion    VITALS:  Blood pressure (!) 147/91, pulse (!) 56, temperature 99.2 F (37.3 C), temperature source Oral, resp. rate 20, height 5\' 7"  (1.702 m), weight 72.6 kg (160 lb), SpO2 94 %.  PHYSICAL EXAMINATION:  GENERAL:  77 y.o.-year-old patient lying in the bed with no acute distress.  EYES: Pupils equal, round, reactive to light and accommodation. No scleral icterus. Extraocular muscles intact.  HEENT: Head atraumatic, normocephalic. Oropharynx and nasopharynx clear.  NECK:  Supple, no jugular venous distention. No thyroid enlargement, no tenderness.  LUNGS: Normal breath sounds bilaterally, no wheezing, rales,rhonchi or crepitation. No use of accessory muscles of respiration.   CARDIOVASCULAR: S1, S2 normal. No murmurs, rubs, or gallops.  ABDOMEN: Soft, nontender, nondistended. Bowel sounds present. No organomegaly or mass.  EXTREMITIES: No pedal edema, cyanosis, or clubbing.  NEUROLOGIC: Cranial nerves II through XII are intact. Muscle strength 5/5 in all extremities. Sensation intact. Gait not checked.  PSYCHIATRIC: The patient is alert and oriented x 3.  SKIN: No obvious rash, lesion, or ulcer.   Physical Exam LABORATORY PANEL:   CBC Recent Labs  Lab 12/06/17 0510  WBC 8.7  HGB 12.1  HCT 36.0  PLT 155   ------------------------------------------------------------------------------------------------------------------  Chemistries  Recent Labs  Lab 12/03/17 2023  12/07/17 0504  NA 143   < > 141  K 3.0*   < > 3.3*  CL 106   < > 106  CO2 25   < > 29  GLUCOSE 190*   < > 126*  BUN 13   < > 5*  CREATININE 1.07*   < > 0.74  CALCIUM 9.6   < > 8.8*  MG  --    < > 1.9  AST 28  --   --   ALT 18  --   --   ALKPHOS 76  --   --   BILITOT 1.2  --   --    < > = values in this interval not displayed.   ------------------------------------------------------------------------------------------------------------------  Cardiac Enzymes Recent Labs  Lab 12/03/17 2023  TROPONINI <0.03   ------------------------------------------------------------------------------------------------------------------  RADIOLOGY:  Dg Abd Acute W/chest  Result Date: 12/06/2017 CLINICAL DATA:  Small-bowel obstruction EXAM: DG ABDOMEN ACUTE W/ 1V CHEST COMPARISON:  12/05/2017 FINDINGS:  A few mildly dilated loops of small bowel are identified. Nasogastric catheter remains in the stomach. No free air is seen. Persistent bibasilar changes are noted in the lungs. No acute bony abnormality is seen. Cardiac shadow is stable. 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 IMPRESSION: Mild small bowel dilatation stable from the previous exam. Nasogastric catheter stable from the prior study.  Electronically Signed   By: Inez Catalina M.D.   On: 12/06/2017 08:00    ASSESSMENT AND PLAN:  *Acute SBO  Resolving In discussion with general surgery, for NG tube to be removed and diet advance later today   *Acute hypomagnesemia/hypokalemia Much improved Replete with IV potassium  *Chronicaxiety Stable on current regiment  *Chronicdpression  Stable on current regiment  *Essential hypertension Stable on current regiment  *Hyperlipidemia Stable  * Mild headache Resolved Continue Tylenol as needed   Disposition home tomorrow barring any complication in discussion with general surgery/Dr. Rosana Hoes   All the records are reviewed and case discussed with Care Management/Social Workerr. Management plans discussed with the patient, family and they are in agreement.  CODE STATUS: full  TOTAL TIME TAKING CARE OF THIS PATIENT: 35 minutes.     POSSIBLE D/C IN 1 DAYS, DEPENDING ON CLINICAL CONDITION.   Avel Peace Salary M.D on 12/07/2017   Between 7am to 6pm - Pager - [phone]  After 6pm go to [email or web address] - password [email or web address] [facility]  Office  [phone]  CC: Primary care physician; Denton Lank, MD  Note: This dictation was prepared with Dragon dictation along with smaller phrase technology. Any transcriptional errors that result from this process are unintentional.

## 2017-12-08 LAB — BASIC METABOLIC PANEL
Anion gap: 9 (ref 5–15)
BUN: 7 mg/dL — ABNORMAL LOW (ref 8–23)
CALCIUM: 9.3 mg/dL (ref 8.9–10.3)
CO2: 26 mmol/L (ref 22–32)
CREATININE: 0.65 mg/dL (ref 0.44–1.00)
Chloride: 105 mmol/L (ref 98–111)
GFR calc Af Amer: >60 mL/min (ref >60)
GLUCOSE: 119 mg/dL — AB (ref 70–99)
POTASSIUM: 3.1 mmol/L — AB (ref 3.5–5.1)
Sodium: 140 mmol/L (ref 135–145)

## 2017-12-08 MED ORDER — POTASSIUM CHLORIDE 20 MEQ PO PACK
60.0000 meq | PACK | Freq: Once | ORAL | Status: DC
  Filled 2017-12-08: qty 3

## 2017-12-08 MED ORDER — HYDROMORPHONE HCL 1 MG/ML IJ SOLN
0.5000 mg | Freq: Once | INTRAMUSCULAR | Status: AC | PRN
  Administered 2017-12-08: 1 mg via INTRAVENOUS
  Filled 2017-12-08: qty 1

## 2017-12-08 MED ORDER — POTASSIUM CHLORIDE CRYS ER 20 MEQ PO TBCR
60.0000 meq | EXTENDED_RELEASE_TABLET | Freq: Once | ORAL | Status: AC
  Administered 2017-12-08: 60 meq via ORAL
  Filled 2017-12-08: qty 3

## 2017-12-08 NOTE — Progress Notes (Signed)
12/08/2017 3:49 PM  [place] Saa Patrie to be D/C'd Home per MD order.  Discussed prescriptions and follow up appointments with the patient. Prescriptions given to patient, medication list explained in detail. Pt verbalized understanding.  Allergies as of 12/08/2017      Reactions   Iohexol Hives    Code: HIVES, Desc: PTS GRANDDAUGHTER [name] CALLED 12/31/08 TO NOTIFY us THAT HER GRANDMOTHER HAD A REACTION TO THE IV DYE 8 HRS POST CT EXAM PT DEVELOPED HIVES/RASH.12/31/08/RM, Onset Date: [id number]   Morphine Other (See Comments)   confusion      Medication List    TAKE these medications   amLODipine 10 MG tablet Commonly known as:  NORVASC Take 10 mg by mouth daily.   aspirin 81 MG EC tablet Take 81 mg by mouth daily.   brimonidine 0.2 % ophthalmic solution Commonly known as:  ALPHAGAN Place 1 drop into both eyes 2 (two) times daily.   clonazePAM 1 MG tablet Commonly known as:  KLONOPIN Take 0.5-1 mg by mouth 2 (two) times daily as needed for anxiety.   dorzolamide 2 % ophthalmic solution Commonly known as:  TRUSOPT Place 1 drop into both eyes 2 (two) times daily.   latanoprost 0.005 % ophthalmic solution Commonly known as:  XALATAN Place 1 drop into both eyes at bedtime.   omeprazole 40 MG capsule Commonly known as:  PRILOSEC Take 1 capsule (40 mg total) by mouth daily.   potassium chloride 10 MEQ tablet Commonly known as:  K-DUR,KLOR-CON Take 10 mEq by mouth 2 (two) times daily.   sertraline 100 MG tablet Commonly known as:  ZOLOFT Take 150 mg by mouth daily.   simvastatin 20 MG tablet Commonly known as:  ZOCOR Take 20 mg by mouth daily.   VITAMIN B 12 PO Take 500 mcg by mouth 2 (two) times daily.       Vitals:   12/08/17 0439 12/08/17 1210  BP: 128/86 101/89  Pulse: 72 (!) 51  Resp: 20 17  Temp: 98.6 F (37 C) 98.7 F (37.1 C)  SpO2: 98% 95%    Skin clean, dry and intact without evidence of skin break down, no evidence of skin tears noted. IV catheter  discontinued intact. Site without signs and symptoms of complications. Dressing and pressure applied. Pt denies pain at this time. No complaints noted.  An After Visit Summary was printed and given to the patient. Patient escorted via [place], and D/C home via private auto.  Dola Argyle

## 2017-12-08 NOTE — Discharge Summary (Signed)
[facility] [facility] [place] NAME: [name]    MR#:  [id number]  DATE OF BIRTH:  1941-04-19  DATE OF ADMISSION:  12/03/2017 ADMITTING PHYSICIAN: Lance Coon, MD  DATE OF DISCHARGE: No discharge date for patient encounter.  PRIMARY CARE PHYSICIAN: Denton Lank, MD    ADMISSION DIAGNOSIS:  SBO (small bowel obstruction) ([place]) [K56.609]  DISCHARGE DIAGNOSIS:  Principal Problem:   SBO (small bowel obstruction) (H[facility]) Active Problems:   Hyperlipidemia   Anxiety   Depression   Essential hypertension   GERD (gastroesophageal reflux disease)   SECONDARY DIAGNOSIS:   Past Medical History:  Diagnosis Date  . Anxiety state, unspecified   . Arthropathy, unspecified, site unspecified   . Benign carcinoid tumor of the duodenum   . Blindness of one eye   . Breast cancer ([place]) 2009  . Cancer [facility])    breast  . Colon cancer ([place])   . Depressive disorder, not elsewhere classified   . Diarrhea   . Disease of gallbladder   . Esophageal cancer ([place])   . GERD (gastroesophageal reflux disease)   . Headache(784.0)   . Hyperlipidemia   . Hypertension   . Kidney stones   . OA (osteoarthritis) of knee    right  . Personal history of radiation therapy   . Syncope and collapse   . Unspecified ectopic pregnancy without intrauterine pregnancy     HOSPITAL COURSE:   *AcuteSBO Resolved with conservative therapy/bowel rest, IV fluids for rehydration In discussion with general surgery - ok for discharge home  *Acute hypomagnesemia/hypokalemia repleted  *Chronicaxiety Stable on current regiment  *Chronicdpression Stable on current regiment  *Essential hypertension Stable on current regiment  *Hyperlipidemia Stable  * Mild headache Resolved w/ Tylenol as needed  DISCHARGE CONDITIONS:   stable  CONSULTS OBTAINED:  Treatment Team:  Jules Husbands, MD  DRUG ALLERGIES:   Allergies  Allergen Reactions  .  Iohexol Hives     Code: HIVES, Desc: PTS GRANDDAUGHTER [name] CALLED 12/31/08 TO NOTIFY us THAT HER GRANDMOTHER HAD A REACTION TO THE IV DYE 8 HRS POST CT EXAM PT DEVELOPED HIVES/RASH.12/31/08/RM, Onset Date: [id number]   . Morphine Other (See Comments)    confusion    DISCHARGE MEDICATIONS:   Allergies as of 12/08/2017      Reactions   Iohexol Hives    Code: HIVES, Desc: PTS GRANDDAUGHTER [name] CALLED 12/31/08 TO NOTIFY us THAT HER GRANDMOTHER HAD A REACTION TO THE IV DYE 8 HRS POST CT EXAM PT DEVELOPED HIVES/RASH.12/31/08/RM, Onset Date: [id number]   Morphine Other (See Comments)   confusion      Medication List    TAKE these medications   amLODipine 10 MG tablet Commonly known as:  NORVASC Take 10 mg by mouth daily.   aspirin 81 MG EC tablet Take 81 mg by mouth daily.   brimonidine 0.2 % ophthalmic solution Commonly known as:  ALPHAGAN Place 1 drop into both eyes 2 (two) times daily.   clonazePAM 1 MG tablet Commonly known as:  KLONOPIN Take 0.5-1 mg by mouth 2 (two) times daily as needed for anxiety.   dorzolamide 2 % ophthalmic solution Commonly known as:  TRUSOPT Place 1 drop into both eyes 2 (two) times daily.   latanoprost 0.005 % ophthalmic solution Commonly known as:  XALATAN Place 1 drop into both eyes at bedtime.   omeprazole 40 MG capsule Commonly known as:  PRILOSEC Take 1 capsule (40 mg total) by mouth daily.  potassium chloride 10 MEQ tablet Commonly known as:  K-DUR,KLOR-CON Take 10 mEq by mouth 2 (two) times daily.   sertraline 100 MG tablet Commonly known as:  ZOLOFT Take 150 mg by mouth daily.   simvastatin 20 MG tablet Commonly known as:  ZOCOR Take 20 mg by mouth daily.   VITAMIN B 12 PO Take 500 mcg by mouth 2 (two) times daily.        DISCHARGE INSTRUCTIONS:   If you experience worsening of your admission symptoms, develop shortness of breath, life threatening emergency, suicidal or homicidal thoughts you must seek medical attention  immediately by calling 911 or calling your MD immediately  if symptoms less severe.  You Must read complete instructions/literature along with all the possible adverse reactions/side effects for all the Medicines you take and that have been prescribed to you. Take any new Medicines after you have completely understood and accept all the possible adverse reactions/side effects.   Please note  You were cared for by a hospitalist during your hospital stay. If you have any questions about your discharge medications or the care you received while you were in the hospital after you are discharged, you can call the unit and asked to speak with the hospitalist on call if the hospitalist that took care of you is not available. Once you are discharged, your primary care physician will handle any further medical issues. Please note that NO REFILLS for any discharge medications will be authorized once you are discharged, as it is imperative that you return to your primary care physician (or establish a relationship with a primary care physician if you do not have one) for your aftercare needs so that they can reassess your need for medications and monitor your lab values.    Today   CHIEF COMPLAINT:   Chief Complaint  Patient presents with  . Abdominal Pain  . Nausea  . Emesis  . Fever    HISTORY OF PRESENT ILLNESS:   77 y.o. female who presents with abdominal pain, nausea vomiting.  Here in the ED the patient was found to have small bowel obstruction.  Hospitalist were called for admission VITAL SIGNS:  Blood pressure 128/86, pulse 72, temperature 98.6 F (37 C), temperature source Oral, resp. rate 20, height 5\' 7"  (1.702 m), weight 72.6 kg (160 lb), SpO2 98 %.  I/O:    Intake/Output Summary (Last 24 hours) at 12/08/2017 1118 Last data filed at 12/08/2017 0510 Gross per 24 hour  Intake 221 ml  Output -  Net 221 ml    PHYSICAL EXAMINATION:  GENERAL:  77 y.o.-year-old patient lying in the bed  with no acute distress.  EYES: Pupils equal, round, reactive to light and accommodation. No scleral icterus. Extraocular muscles intact.  HEENT: Head atraumatic, normocephalic. Oropharynx and nasopharynx clear.  NECK:  Supple, no jugular venous distention. No thyroid enlargement, no tenderness.  LUNGS: Normal breath sounds bilaterally, no wheezing, rales,rhonchi or crepitation. No use of accessory muscles of respiration.  CARDIOVASCULAR: S1, S2 normal. No murmurs, rubs, or gallops.  ABDOMEN: Soft, non-tender, non-distended. Bowel sounds present. No organomegaly or mass.  EXTREMITIES: No pedal edema, cyanosis, or clubbing.  NEUROLOGIC: Cranial nerves II through XII are intact. Muscle strength 5/5 in all extremities. Sensation intact. Gait not checked.  PSYCHIATRIC: The patient is alert and oriented x 3.  SKIN: No obvious rash, lesion, or ulcer.   DATA REVIEW:   CBC Recent Labs  Lab 12/06/17 0510  WBC 8.7  HGB 12.1  HCT 36.0  PLT 155    Chemistries  Recent Labs  Lab 12/03/17 2023  12/07/17 0504 12/08/17 0524  NA 143   < > 141 140  K 3.0*   < > 3.3* 3.1*  CL 106   < > 106 105  CO2 25   < > 29 26  GLUCOSE 190*   < > 126* 119*  BUN 13   < > 5* 7*  CREATININE 1.07*   < > 0.74 0.65  CALCIUM 9.6   < > 8.8* 9.3  MG  --    < > 1.9  --   AST 28  --   --   --   ALT 18  --   --   --   ALKPHOS 76  --   --   --   BILITOT 1.2  --   --   --    < > = values in this interval not displayed.    Cardiac Enzymes Recent Labs  Lab 12/03/17 2023  TROPONINI <0.03    Microbiology Results  Results for orders placed or performed in visit on 11/30/10  Ova and parasite screen     Status: None   Collection Time: 11/30/10  8:38 AM  Result Value Ref Range Status   OP No Ova or Parasites Seen   Final  Stool culture     Status: None   Collection Time: 11/30/10  8:38 AM  Result Value Ref Range Status   Organism ID, Bacteria No Salmonella,Shigella,Campylobacter or Yersinia  Final   Organism  ID, Bacteria isolated.  Final    RADIOLOGY:  No results found.  EKG:   Orders placed or performed during the hospital encounter of 12/03/17  . EKG 12-Lead  . EKG 12-Lead      Management plans discussed with the patient, family and they are in agreement.  CODE STATUS:     Code Status Orders  (From admission, onward)        Start     Ordered   12/03/17 2353  Full code  Continuous     12/03/17 2352    Code Status History    Date Active Date Inactive Code Status Order ID Comments User Context   08/16/2016 1850 08/17/2016 1518 Full Code [id number]  Hillary Bow, MD ED      TOTAL TIME TAKING CARE OF THIS PATIENT: 45 minutes.    Avel Peace Yanira Tolsma M.D on 12/08/2017 at 11:18 AM  Between 7am to 6pm - Pager - [phone]  After 6pm go to [email or web address] - password [email or web address] [facility]  Office  [phone]  CC: Primary care physician; Denton Lank, MD   Note: This dictation was prepared with Dragon dictation along with smaller phrase technology. Any transcriptional errors that result from this process are unintentional.

## 2017-12-08 NOTE — Progress Notes (Signed)
SURGICAL PROGRESS NOTE (cpt [phone])  Hospital Day(s): 5.   Post op day(s):  Marland Kitchen   Interval History: Patient seen and examined, no acute events or new complaints overnight. Patient reports she tolerated clear liquids diet all day yesterday, though her diet was not advanced to full liquids diet as ordered. She otherwise denies abdominal pain, N/V, fever/chills, CP, or SOB, though she expressed concern about her loose BM that she says have been starting to become less frequent and more formed with +flatus.  Review of Systems:  Constitutional: denies fever, chills  HEENT: denies cough or congestion  Respiratory: denies any shortness of breath  Cardiovascular: denies chest pain or palpitations  Gastrointestinal: abdominal pain, N/V, and bowel function as per interval history Genitourinary: denies burning with urination or urinary frequency Musculoskeletal: denies pain, decreased motor or sensation Integumentary: denies any other rashes or skin discolorations Neurological: denies HA or vision/hearing changes   Vital signs in last 24 hours: [min-max] current  Temp:  [98.6 F (37 C)-99.5 F (37.5 C)] 98.7 F (37.1 C) (07/06 1210) Pulse Rate:  [51-72] 51 (07/06 1210) Resp:  [17-20] 17 (07/06 1210) BP: (101-128)/(72-89) 101/89 (07/06 1210) SpO2:  [95 %-98 %] 95 % (07/06 1210)     Height: 5\' 7"  (170.2 cm) Weight: 160 lb (72.6 kg) BMI (Calculated): 25.05   Intake/Output this shift:  No intake/output data recorded.   Intake/Output last 2 shifts:  @IOLAST2SHIFTS @   Physical Exam:  Constitutional: alert, cooperative and no distress  HENT: normocephalic without obvious abnormality  Eyes: PERRL, EOM's grossly intact and symmetric  Neuro: CN II - XII grossly intact and symmetric without deficit  Respiratory: breathing non-labored at rest  Cardiovascular: regular rate and sinus rhythm  Gastrointestinal: soft, non-tender, and non-distended Musculoskeletal: UE and LE FROM, no edema or wounds,  motor and sensation grossly intact, NT   Labs:  CBC Latest Ref Rng & Units 12/06/2017 12/05/2017 12/04/2017  WBC 3.6 - 11.0 K/uL 8.7 10.2 11.3(H)  Hemoglobin 12.0 - 16.0 g/dL 12.1 12.3 13.3  Hematocrit 35.0 - 47.0 % 36.0 37.1 39.6  Platelets 150 - 440 K/uL 155 180 181   CMP Latest Ref Rng & Units 12/08/2017 12/07/2017 12/06/2017  Glucose 70 - 99 mg/dL 119(H) 126(H) 134(H)  BUN 8 - 23 mg/dL 7(L) 5(L) 6(L)  Creatinine 0.44 - 1.00 mg/dL 0.65 0.74 0.66  Sodium 135 - 145 mmol/L 140 141 142  Potassium 3.5 - 5.1 mmol/L 3.1(L) 3.3(L) 2.9(L)  Chloride 98 - 111 mmol/L 105 106 103  CO2 22 - 32 mmol/L 26 29 31   Calcium 8.9 - 10.3 mg/dL 9.3 8.8(L) 8.9  Total Protein 6.5 - 8.1 g/dL - - -  Total Bilirubin 0.3 - 1.2 mg/dL - - -  Alkaline Phos 38 - 126 U/L - - -  AST 15 - 41 U/L - - -  ALT 0 - 44 U/L - - -   Imaging studies: No new pertinent imaging studies   Assessment/Plan: (ICD-10's: K56.52) 77 y.o.femalewithwhat appears to beresolvingcomplete small bowel obstruction, likely attributable to post-surgical adhesions followingRight colectomy, esophagectomy, and gastrectomy, complicated by pertinent comorbidities includingHTN, HLD, GERD, nephrolithiasis, osteoarthritis, chronic headaches (including migraines per patient), unilateral blindness; cancers of esophagus, colon, and breast; generalized anxiety disorder, and major depression disorder.  - advanced to soft diet - DVT prophylaxis,ambulation encouraged - monitor bowel function and abdominal exam  - medical management comorbidities as per medical team  - okay with discharge home if tolerates regular diet  - outpatient follow-up with primary  care  - surgical follow-up only as needed  All of the above findings and recommendations were discussed with the patient, her RN, and Dr. Jerelyn Charles, and all of patient's questions were answered to her expressed satisfaction.  Thank you for the opportunity to  participate in this patient's care.  -- Marilynne Drivers Rosana Hoes, MD, [place]: [facility] - Partnering for exceptional care. Office: [phone]

## 2017-12-17 ENCOUNTER — Telehealth: Payer: Self-pay | Admitting: Gastroenterology

## 2017-12-17 NOTE — Telephone Encounter (Signed)
The pt has been scheduled for 7/26 with Alonza Bogus for post hosp f/u

## 2017-12-17 NOTE — Telephone Encounter (Signed)
Pt was at the ED on 12/03/17 for small bowel obstruction. I made an appt with Amy on 01/03/18 but pt would like to be seen sooner and possibly see Dr. Ardis Hughs. Pls call her.

## 2017-12-24 DIAGNOSIS — K5652 Intestinal adhesions [bands] with complete obstruction: Secondary | ICD-10-CM

## 2017-12-28 ENCOUNTER — Ambulatory Visit: Payer: Medicare Other | Admitting: Gastroenterology

## 2017-12-28 ENCOUNTER — Encounter

## 2017-12-28 ENCOUNTER — Encounter: Payer: Self-pay | Admitting: Gastroenterology

## 2017-12-28 VITALS — BP 112/72 | HR 68 | Ht 67.0 in | Wt 164.0 lb

## 2017-12-28 DIAGNOSIS — K529 Noninfective gastroenteritis and colitis, unspecified: Secondary | ICD-10-CM | POA: Diagnosis not present

## 2017-12-28 MED ORDER — COLESTIPOL HCL 1 G PO TABS: 1.0000 g | ORAL_TABLET | Freq: Two times a day (BID) | ORAL | 1 refills | Status: DC

## 2017-12-28 NOTE — Progress Notes (Signed)
12/28/2017 [name] [id number] [birth year]  Review of pertinent gastrointestinal problems:  1. History of duodenal carcinoid. Initially by Dr. Vira Agar then underwent endoscopic ultrasound June 20, 2007. US showed no involvement of the muscularis propria and so EMR was proceeded with. Unfortunate complication of duodenal perforation, although pathology showed complete removal of the duodenal small low-grade neuroendocrine tumor, well circumscribed duodenal carcinoid. Did not involve the muscularis propria or deep margin of the specimen. She underwent emergency surgical oversewing of the iatrogenic perforation.  2. Chronic diarrhea, started mid 2008, workup by Dr. Vira Agar, colonoscopy was negative, upper endoscopy showed the carcinoid above. I performed a 24-hour urine collection for 5-HIAA and this was indeed slightly elevated at 11.5, normal is less than 6. This is elevated but is much less than is normally seen for carcinoid syndrome. CT scan abdomen and pelvis suggested an 11 mm hypodensity in the head of the pancreas, otherwise essentially normal. Upper EUS June 2006 showed small cyst in head of pancreas, this was aspirated and there were no signs of malignancy.Repeat 24 hour urine collection showed normal 5 HIAA levels, July 2009. Colonoscopy February 2015 found hemicolectomy anastomosis, one hyperplastic polyp. Biopsies randomly were all normal.  3. Colon Cancer: s/p 2004, did not need chemo or xrt; last colonoscopy February 2015 with one sessile polyp removed that was a hyperplastic polyp and random biopsies were normal.  HISTORY OF PRESENT ILLNESS: This is a 77 year old female who is a patient of Dr. Ardis Hughs.  Past GI history as above.  She was actually recently admitted to [facility] earlier this month for a small bowel obstruction.  She does have history of abdominal surgeries in the past.  This is her first episode of small bowel obstruction.  CT scan demonstrated transition  point and said likely secondary to adhesions.  That resolved.  She is here today once again to discuss her chronic diarrhea.  Thought to be related to her previous right hemicolectomy and her diarrhea.  Previous colon biopsies randomly were normal on a couple of occasions.  She has tried Questran in the past, but physically cannot make herself take the powder.  Looks like other things such as welchol have been tried in the past, but not sure if they are really effective or for how long she really took them.  Not using Imodium as needed.  He has a lot of diarrhea postprandially even before she is done eating.  Has urgency and incontinence.  Has nocturnal bowel movements.   Past Medical History:  Diagnosis Date  . Anxiety state, unspecified   . Arthropathy, unspecified, site unspecified   . Benign carcinoid tumor of the duodenum   . Blindness of one eye   . Breast cancer ([place]) 2009  . Cancer [facility])    breast  . Colon cancer ([place])   . Depressive disorder, not elsewhere classified   . Diarrhea   . Disease of gallbladder   . Esophageal cancer ([place])   . GERD (gastroesophageal reflux disease)   . Headache(784.0)   . Hyperlipidemia   . Hypertension   . Kidney stones   . OA (osteoarthritis) of knee    right  . Personal history of radiation therapy   . Syncope and collapse   . Unspecified ectopic pregnancy without intrauterine pregnancy    Past Surgical History:  Procedure Laterality Date  . BREAST BIOPSY Bilateral   . BREAST EXCISIONAL BIOPSY Right 2009   (+)  . BREAST SURGERY Right   .  Cataract surgery Left 10/11/2017   Implant  . CHOLECYSTECTOMY  05-27-12  . COLON RESECTION    . COLONOSCOPY    . PARTIAL HYSTERECTOMY    . TOTAL KNEE ARTHROPLASTY Right     reports that she has never smoked. She has never used smokeless tobacco. She reports that she does not drink alcohol or use drugs. family history includes Cancer in her mother; Diabetes in her daughter; Heart attack in her  brother. Allergies  Allergen Reactions  . Iohexol Hives     Code: HIVES, Desc: PTS GRANDDAUGHTER [name] CALLED 12/31/08 TO NOTIFY us THAT HER GRANDMOTHER HAD A REACTION TO THE IV DYE 8 HRS POST CT EXAM PT DEVELOPED HIVES/RASH.12/31/08/RM, Onset Date: [id number]   . Morphine Other (See Comments)    confusion      Outpatient Encounter Medications as of 12/28/2017  Medication Sig  . amLODipine (NORVASC) 10 MG tablet Take 10 mg by mouth daily.  Marland Kitchen aspirin 81 MG EC tablet Take 81 mg by mouth daily.   . brimonidine (ALPHAGAN) 0.2 % ophthalmic solution Place 1 drop into both eyes 2 (two) times daily.  . clonazePAM (KLONOPIN) 1 MG tablet Take 0.5-1 mg by mouth 2 (two) times daily as needed for anxiety.   . Cyanocobalamin (VITAMIN B 12 PO) Take 500 mcg by mouth 2 (two) times daily.   . dorzolamide (TRUSOPT) 2 % ophthalmic solution Place 1 drop into both eyes 2 (two) times daily.  Marland Kitchen latanoprost (XALATAN) 0.005 % ophthalmic solution Place 1 drop into both eyes at bedtime.  Marland Kitchen omeprazole (PRILOSEC) 40 MG capsule Take 1 capsule (40 mg total) by mouth daily.  . potassium chloride (K-DUR,KLOR-CON) 10 MEQ tablet Take 10 mEq by mouth 2 (two) times daily.   . sertraline (ZOLOFT) 100 MG tablet Take 150 mg by mouth daily.   . simvastatin (ZOCOR) 20 MG tablet Take 20 mg by mouth daily.   No facility-administered encounter medications on file as of 12/28/2017.      REVIEW OF SYSTEMS  : All other systems reviewed and negative except where noted in the History of Present Illness.   PHYSICAL EXAM: BP 112/72   Pulse 68   Ht 5\' 7"  (1.702 m)   Wt 164 lb (74.4 kg)   BMI 25.69 kg/m  General: Well developed black female in no acute distress Head: Normocephalic and atraumatic Eyes:  Sclerae anicteric, conjunctiva pink. Ears: Normal auditory acuity Lungs: Clear throughout to auscultation; no increased WOB. Heart: Regular rate and rhythm; no M/R/G. Abdomen: Soft, non-distended.  BS present.   Non-tender. Musculoskeletal: Symmetrical with no gross deformities  Skin: No lesions on visible extremities Extremities: No edema  Neurological: Alert oriented x 4, grossly non-focal Psychological:  Alert and cooperative. Normal mood and affect  ASSESSMENT AND PLAN: *77 y.o. female with persistent chronic diarrhea, personal history of colon cancer   She had a right hemicolectomy many years ago and has had her gallbladder removed. This puts her at high risk for bile acid related diarrhea as well.  Has tried [place] in the past but could not physically take the powder.  Will try colestid 1 gram BID for now and can use Imodium prn as well.  Will follow-up in 4 week at which time could consider increasing colestid if needed, using lomotil instead of Imodium, using welchol, etc.  **45 minutes spent with the patient in which at least 50% was spent in discussion of her condition and treatment options.  CC:  Denton Lank, MD

## 2017-12-28 NOTE — Patient Instructions (Signed)
Use Imodium as needed.   We have sent the following medications to your pharmacy for you to pick up at your convenience:  Colestid 1 gram twice a day

## 2017-12-30 NOTE — Progress Notes (Signed)
I agree with the above note, plan 

## 2018-01-03 ENCOUNTER — Ambulatory Visit: Payer: Medicare Other | Admitting: Physician Assistant

## 2018-01-28 ENCOUNTER — Ambulatory Visit: Payer: Medicare Other | Admitting: Gastroenterology

## 2018-02-13 ENCOUNTER — Other Ambulatory Visit: Payer: Self-pay | Admitting: Family Medicine

## 2018-02-13 DIAGNOSIS — Z780 Asymptomatic menopausal state: Secondary | ICD-10-CM

## 2018-02-13 DIAGNOSIS — Z1231 Encounter for screening mammogram for malignant neoplasm of breast: Secondary | ICD-10-CM

## 2018-02-26 ENCOUNTER — Ambulatory Visit: Payer: Medicare Other | Admitting: Physician Assistant

## 2018-05-27 ENCOUNTER — Ambulatory Visit
Admission: RE | Admit: 2018-05-27 | Discharge: 2018-05-27 | Disposition: A | Discharge status: Home / Self Care | Payer: Medicare Other | Source: Ambulatory Visit | Attending: Family Medicine | Admitting: Family Medicine

## 2018-05-27 DIAGNOSIS — Z1231 Encounter for screening mammogram for malignant neoplasm of breast: Secondary | ICD-10-CM | POA: Diagnosis present

## 2018-05-27 DIAGNOSIS — Z780 Asymptomatic menopausal state: Secondary | ICD-10-CM | POA: Diagnosis not present

## 2019-01-03 ENCOUNTER — Encounter: Payer: Self-pay | Admitting: Gastroenterology

## 2019-04-07 ENCOUNTER — Ambulatory Visit: Payer: Medicare Other | Admitting: Gastroenterology

## 2019-04-09 ENCOUNTER — Encounter: Payer: Self-pay | Admitting: Gastroenterology

## 2019-04-09 ENCOUNTER — Ambulatory Visit: Payer: Medicare Other | Admitting: Gastroenterology

## 2019-04-09 VITALS — BP 120/70 | HR 60 | Temp 99.2°F | Ht 67.0 in | Wt 162.0 lb

## 2019-04-09 DIAGNOSIS — Z85038 Personal history of other malignant neoplasm of large intestine: Secondary | ICD-10-CM

## 2019-04-09 NOTE — Patient Instructions (Signed)
Follow up as needed

## 2019-04-09 NOTE — Progress Notes (Signed)
Review of pertinent gastrointestinal problems:  1. History of duodenal carcinoid. Initially by Dr. Vira Agar then underwent endoscopic ultrasound June 20, 2007. US showed no involvement of the muscularis propria and so EMR was proceeded with. Unfortunate complication of duodenal perforation, although pathology showed complete removal of the duodenal small low-grade neuroendocrine tumor, well circumscribed duodenal carcinoid. Did not involve the muscularis propria or deep margin of the specimen. She underwent emergency surgical oversewing of the iatrogenic perforation.  2. Chronic diarrhea, started mid 2008, workup by Dr. Vira Agar, colonoscopy was negative, upper endoscopy showed the carcinoid above. I performed a 24-hour urine collection for 5-HIAA and this was indeed slightly elevated at 11.5, normal is less than 6. This is elevated but is much less than is normally seen for carcinoid syndrome. CT scan abdomen and pelvis suggested an 11 mm hypodensity in the head of the pancreas, otherwise essentially normal. Upper EUS June 2006 showed small cyst in head of pancreas, this was aspirated and there were no signs of malignancy.Repeat 24 hour urine collection showed normal 5 HIAA levels, July 2009. Colonoscopy February 2010 found hemicolectomy anastomosis. Otherwise normal. Biopsies randomly were all normal  3. Colon Cancer: s/p 2004, did not need chemo or xrt; last colonoscopy February 2010, next February 2015.  Colonoscopy September 2015 single subcentimeter polyp was noted and removed.  Previous right colon anastomosis was normal-appearing, the colon was randomly biopsied for her chronic diarrhea.  Polyp was hyperplastic, but random biopsies were normal.   HPI: This is a very pleasant 78 year old woman who will be 79 next month.  I last saw her at the time of a colonoscopy about 5 years ago.  See that results summarized above.  She is here with her daughter or granddaughter today.  She had colon cancer  remotely, she is here to discuss surveillance, screening colonoscopy.  She is not at all interested in having another colonoscopy.  She has no changes in her bowels, no overt bleeding.  Her weight is down a bit past several months but she is not very concerned about it.  Chief complaint is personal history of colon cancer  ROS: complete GI ROS as described in HPI, all other review negative.  Constitutional:  No unintentional weight loss   Past Medical History:  Diagnosis Date  . Anxiety state, unspecified   . Arthropathy, unspecified, site unspecified   . Benign carcinoid tumor of the duodenum   . Blindness of one eye   . Breast cancer ([place]) 2009  . Cancer [facility])    breast  . Colon cancer ([place])   . Depressive disorder, not elsewhere classified   . Diarrhea   . Disease of gallbladder   . Esophageal cancer ([place])   . GERD (gastroesophageal reflux disease)   . Headache(784.0)   . Hyperlipidemia   . Hypertension   . Kidney stones   . OA (osteoarthritis) of knee    right  . Personal history of radiation therapy   . Syncope and collapse   . Unspecified ectopic pregnancy without intrauterine pregnancy     Past Surgical History:  Procedure Laterality Date  . BREAST BIOPSY Bilateral   . BREAST EXCISIONAL BIOPSY Right 2009   (+)  . BREAST SURGERY Right   . Cataract surgery Left 10/11/2017   Implant  . CHOLECYSTECTOMY  05-27-12  . COLON RESECTION    . COLONOSCOPY    . PARTIAL HYSTERECTOMY    . TOTAL KNEE ARTHROPLASTY Right     Current Outpatient Medications  Medication Sig  Dispense Refill  . amLODipine (NORVASC) 10 MG tablet Take 10 mg by mouth daily.    Marland Kitchen aspirin 81 MG EC tablet Take 81 mg by mouth daily.     . brimonidine (ALPHAGAN) 0.2 % ophthalmic solution Place 1 drop into both eyes 2 (two) times daily.    . Cyanocobalamin (VITAMIN B 12 PO) Take 500 mcg by mouth 2 (two) times daily.     . dorzolamide (TRUSOPT) 2 % ophthalmic solution Place 1 drop into both eyes 2  (two) times daily.    Marland Kitchen latanoprost (XALATAN) 0.005 % ophthalmic solution Place 1 drop into both eyes at bedtime.    . memantine (NAMENDA) 5 MG tablet Take 1 tablet by mouth daily.    Marland Kitchen omeprazole (PRILOSEC) 40 MG capsule Take 1 capsule (40 mg total) by mouth daily. 60 capsule 0  . potassium chloride (K-DUR,KLOR-CON) 10 MEQ tablet Take 10 mEq by mouth 2 (two) times daily.     . QUEtiapine (SEROQUEL) 25 MG tablet Take 25 mg by mouth at bedtime.    . sertraline (ZOLOFT) 100 MG tablet Take 150 mg by mouth daily.     . simvastatin (ZOCOR) 20 MG tablet Take 20 mg by mouth daily.     No current facility-administered medications for this visit.     Allergies as of 04/09/2019 - Review Complete 04/09/2019  Allergen Reaction Noted  . Iohexol Hives 12/31/2008  . Morphine Other (See Comments)     Family History  Problem Relation Age of Onset  . Cancer Mother        unknown type  . Heart attack Brother   . Diabetes Daughter   . Breast cancer Neg Hx     Social History   Socioeconomic History  . Marital status: Widowed    Spouse name: Not on file  . Number of children: Not on file  . Years of education: Not on file  . Highest education level: Not on file  Occupational History  . Occupation: Retired  Scientific laboratory technician  . Financial resource strain: Not on file  . Food insecurity    Worry: Not on file    Inability: Not on file  . Transportation needs    Medical: Not on file    Non-medical: Not on file  Tobacco Use  . Smoking status: Never Smoker  . Smokeless tobacco: Never Used  Substance and Sexual Activity  . Alcohol use: No  . Drug use: No  . Sexual activity: Not on file  Lifestyle  . Physical activity    Days per week: Not on file    Minutes per session: Not on file  . Stress: Not on file  Relationships  . Social Herbalist on phone: Not on file    Gets together: Not on file    Attends religious service: Not on file    Active member of club or organization: Not on  file    Attends meetings of clubs or organizations: Not on file    Relationship status: Not on file  . Intimate partner violence    Fear of current or ex partner: Not on file    Emotionally abused: Not on file    Physically abused: Not on file    Forced sexual activity: Not on file  Other Topics Concern  . Not on file  Social History Narrative   Lives alone   Caffeine use: coffee/soda     Physical Exam: BP 120/70   Pulse 60  Temp 99.2 F (37.3 C)   Ht 5\' 7"  (1.702 m)   Wt 162 lb (73.5 kg)   BMI 25.37 kg/m  Constitutional: generally well-appearing Psychiatric: alert and oriented x3 Abdomen: soft, nontender, nondistended, no obvious ascites, no peritoneal signs, normal bowel sounds No peripheral edema noted in lower extremities  Assessment and plan: 78 y.o. female with personal history of colon cancer  She will be 78 next month.  She is not at all interested in having another colonoscopy.  I think that is reasonable especially since she has really no changes in her bowel habits bleeding or serious abdominal pains.  She knows if she changes her mind or if she does start to have any serious GI issues she can call anytime.  Please see the "Patient Instructions" section for addition details about the plan.  Owens Loffler, MD [facility] 04/09/2019, 11:13 AM

## 2019-05-14 ENCOUNTER — Emergency Department: Payer: Medicare Other

## 2019-05-14 ENCOUNTER — Emergency Department
Admission: EM | Admit: 2019-05-14 | Discharge: 2019-05-14 | Disposition: A | Discharge status: Home / Self Care | Payer: Medicare Other | Attending: Emergency Medicine | Admitting: Emergency Medicine

## 2019-05-14 ENCOUNTER — Encounter: Payer: Self-pay | Admitting: Emergency Medicine

## 2019-05-14 ENCOUNTER — Other Ambulatory Visit: Payer: Self-pay

## 2019-05-14 DIAGNOSIS — R112 Nausea with vomiting, unspecified: Secondary | ICD-10-CM | POA: Insufficient documentation

## 2019-05-14 DIAGNOSIS — Z923 Personal history of irradiation: Secondary | ICD-10-CM | POA: Diagnosis not present

## 2019-05-14 DIAGNOSIS — K449 Diaphragmatic hernia without obstruction or gangrene: Secondary | ICD-10-CM

## 2019-05-14 DIAGNOSIS — Z853 Personal history of malignant neoplasm of breast: Secondary | ICD-10-CM | POA: Diagnosis not present

## 2019-05-14 DIAGNOSIS — Z85038 Personal history of other malignant neoplasm of large intestine: Secondary | ICD-10-CM | POA: Insufficient documentation

## 2019-05-14 DIAGNOSIS — Z20828 Contact with and (suspected) exposure to other viral communicable diseases: Secondary | ICD-10-CM | POA: Diagnosis not present

## 2019-05-14 DIAGNOSIS — R197 Diarrhea, unspecified: Secondary | ICD-10-CM | POA: Diagnosis not present

## 2019-05-14 DIAGNOSIS — Z7982 Long term (current) use of aspirin: Secondary | ICD-10-CM | POA: Insufficient documentation

## 2019-05-14 DIAGNOSIS — Z96651 Presence of right artificial knee joint: Secondary | ICD-10-CM | POA: Insufficient documentation

## 2019-05-14 DIAGNOSIS — Z8501 Personal history of malignant neoplasm of esophagus: Secondary | ICD-10-CM | POA: Diagnosis not present

## 2019-05-14 DIAGNOSIS — R109 Unspecified abdominal pain: Secondary | ICD-10-CM | POA: Diagnosis present

## 2019-05-14 DIAGNOSIS — Z79899 Other long term (current) drug therapy: Secondary | ICD-10-CM | POA: Insufficient documentation

## 2019-05-14 DIAGNOSIS — N390 Urinary tract infection, site not specified: Secondary | ICD-10-CM

## 2019-05-14 DIAGNOSIS — I10 Essential (primary) hypertension: Secondary | ICD-10-CM | POA: Insufficient documentation

## 2019-05-14 LAB — COMPREHENSIVE METABOLIC PANEL
ALT: 19 U/L (ref 0–44)
AST: 23 U/L (ref 15–41)
Albumin: 4 g/dL (ref 3.5–5.0)
Alkaline Phosphatase: 56 U/L (ref 38–126)
Anion gap: 11 (ref 5–15)
BUN: 17 mg/dL (ref 8–23)
CO2: 27 mmol/L (ref 22–32)
Calcium: 9.9 mg/dL (ref 8.9–10.3)
Chloride: 103 mmol/L (ref 98–111)
Creatinine, Ser: 1.07 mg/dL — ABNORMAL HIGH (ref 0.44–1.00)
GFR calc Af Amer: 58 mL/min — ABNORMAL LOW (ref >60)
GFR calc non Af Amer: 50 mL/min — ABNORMAL LOW (ref >60)
Glucose, Bld: 85 mg/dL (ref 70–99)
Potassium: 3.8 mmol/L (ref 3.5–5.1)
Sodium: 141 mmol/L (ref 135–145)
Total Bilirubin: 0.8 mg/dL (ref 0.3–1.2)
Total Protein: 7.2 g/dL (ref 6.5–8.1)

## 2019-05-14 LAB — CBC
HCT: 38.1 % (ref 36.0–46.0)
Hemoglobin: 12.6 g/dL (ref 12.0–15.0)
MCH: 30.5 pg (ref 26.0–34.0)
MCHC: 33.1 g/dL (ref 30.0–36.0)
MCV: 92.3 fL (ref 80.0–100.0)
Platelets: 212 10*3/uL (ref 150–400)
RBC: 4.13 MIL/uL (ref 3.87–5.11)
RDW: 14.1 % (ref 11.5–15.5)
WBC: 5.9 10*3/uL (ref 4.0–10.5)
nRBC: 0 % (ref 0.0–0.2)

## 2019-05-14 LAB — URINALYSIS, COMPLETE (UACMP) WITH MICROSCOPIC
Bilirubin Urine: NEGATIVE
Glucose, UA: NEGATIVE mg/dL
Hgb urine dipstick: NEGATIVE
Ketones, ur: NEGATIVE mg/dL
Nitrite: NEGATIVE
Protein, ur: NEGATIVE mg/dL
Specific Gravity, Urine: 1.008 (ref 1.005–1.030)
pH: 6 (ref 5.0–8.0)

## 2019-05-14 LAB — LIPASE, BLOOD: Lipase: 24 U/L (ref 11–51)

## 2019-05-14 LAB — POC SARS CORONAVIRUS 2 AG: SARS Coronavirus 2 Ag: NEGATIVE

## 2019-05-14 MED ORDER — ONDANSETRON HCL 4 MG/2ML IJ SOLN
4.0000 mg | Freq: Once | INTRAMUSCULAR | Status: AC
  Administered 2019-05-14: 4 mg via INTRAVENOUS
  Filled 2019-05-14: qty 2

## 2019-05-14 MED ORDER — CEPHALEXIN 500 MG PO CAPS: 500.0000 mg | ORAL_CAPSULE | Freq: Three times a day (TID) | ORAL | 0 refills | Status: DC

## 2019-05-14 MED ORDER — SODIUM CHLORIDE 0.9 % IV SOLN
1.0000 g | Freq: Once | INTRAVENOUS | Status: AC
  Administered 2019-05-14: 1 g via INTRAVENOUS
  Filled 2019-05-14: qty 10

## 2019-05-14 MED ORDER — BARIUM SULFATE 2.1 % PO SUSP
450.0000 mL | ORAL | Status: AC
  Administered 2019-05-14: 450 mL via ORAL
  Filled 2019-05-14 (×2): qty 450

## 2019-05-14 MED ORDER — SODIUM CHLORIDE 0.9 % IV BOLUS
1000.0000 mL | Freq: Once | INTRAVENOUS | Status: AC
  Administered 2019-05-14: 1000 mL via INTRAVENOUS

## 2019-05-14 MED ORDER — DOCUSATE SODIUM 100 MG PO CAPS: 100.0000 mg | ORAL_CAPSULE | Freq: Two times a day (BID) | ORAL | 1 refills | Status: AC

## 2019-05-14 NOTE — ED Notes (Signed)
MD at bedside. 

## 2019-05-14 NOTE — ED Triage Notes (Signed)
Here for generalized abdominal pain with NVD. When asked pt to point with one finger to where it hurt the moist took one finger and pointed all over abdomen.  Unlabored.  Vomit X 1.  Diarrhea X 3 since last night. Pt alert and oriented.

## 2019-05-14 NOTE — ED Provider Notes (Signed)
[facility] Emergency Department Provider Note  Time seen: 6:38 PM  I have reviewed the triage vital signs and the nursing notes.   HISTORY  Chief Complaint Abdominal Pain    HPI [name] is a 78 y.o. female with a past medical history of anxiety, gastric reflux, hypertension, hyperlipidemia presents to the emergency department for abdominal pain nausea vomiting diarrhea. According to the patient since yesterday she has been experiencing abdominal cramping that has been moderate at times and intermittent. Patient states she is also been experiencing 3-4 episodes of diarrhea as well as several episodes of vomiting last night. Continues to have abdominal discomfort fairly diffusely across her abdomen more so across the mid abdomen. Denies any dysuria or hematuria. No fever cough or shortness of breath, does state chills last night, temperature 99.2 in the emergency department.   Past Medical History:  Diagnosis Date  . Anxiety state, unspecified   . Arthropathy, unspecified, site unspecified   . Benign carcinoid tumor of the duodenum   . Blindness of one eye   . Breast cancer ([place]) 2009  . Cancer [facility])    breast  . Colon cancer ([place])   . Depressive disorder, not elsewhere classified   . Diarrhea   . Disease of gallbladder   . Esophageal cancer ([place])   . GERD (gastroesophageal reflux disease)   . Headache(784.0)   . Hyperlipidemia   . Hypertension   . Kidney stones   . OA (osteoarthritis) of knee    right  . Personal history of radiation therapy   . Syncope and collapse   . Unspecified ectopic pregnancy without intrauterine pregnancy     Patient Active Problem List   Diagnosis Date Noted  . Chronic diarrhea 12/28/2017  . 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 SBO (small bowel obstruction) ([place]) 12/03/2017  . GERD (gastroesophageal reflux disease) 12/03/2017  . 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 Memory loss 02/13/2016  . Chronic headache 02/13/2016  . Snoring 02/13/2016  . Daytime somnolence 02/13/2016  . Bradycardia 03/11/2014  . Preop cardiovascular exam 05/22/2012  . SYNCOPE AND COLLAPSE 07/13/2010  . Hyperlipidemia 03/29/2010  . Chest pain 10/20/2009  . Nonspecific (abnormal) findings on radiological and other examination of gastrointestinal tract 11/12/2007  . BENIGN CARCINOID TUMOR OF THE DUODENUM 11/11/2007  . Anxiety 11/11/2007  . Depression 11/11/2007  . Essential hypertension 11/11/2007  . ECTOPIC PREGNANCY 11/11/2007  . ARTHRITIS 11/11/2007  . HEADACHE 11/11/2007  . DIARRHEA, CHRONIC 11/11/2007    Past Surgical History:  Procedure Laterality Date  . BREAST BIOPSY Bilateral   . BREAST EXCISIONAL BIOPSY Right 2009   (+)  . BREAST SURGERY Right   . Cataract surgery Left 10/11/2017   Implant  . CHOLECYSTECTOMY  05-27-12  . COLON RESECTION    . COLONOSCOPY    . PARTIAL HYSTERECTOMY    . TOTAL KNEE ARTHROPLASTY Right     Prior to Admission medications   Medication Sig Start Date End Date Taking? Authorizing Provider  amLODipine (NORVASC) 10 MG tablet Take 10 mg by mouth daily.    [provider]  aspirin 81 MG EC tablet Take 81 mg by mouth daily.     [provider]  brimonidine (ALPHAGAN) 0.2 % ophthalmic solution Place 1 drop into both eyes 2 (two) times daily.    [provider]  Cyanocobalamin (VITAMIN B 12 PO) Take 500  mcg by mouth 2 (two) times daily.     [provider]  dorzolamide (TRUSOPT) 2 % ophthalmic solution Place 1 drop into both eyes 2 (two) times daily.    [provider]  latanoprost (XALATAN) 0.005 % ophthalmic solution Place 1 drop into both eyes at bedtime.    [provider]  memantine (NAMENDA) 5 MG tablet Take 1 tablet by mouth daily.    [provider]  omeprazole (PRILOSEC) 40 MG capsule Take 1  capsule (40 mg total) by mouth daily. 08/17/16   Hillary Bow, MD  potassium chloride (K-DUR,KLOR-CON) 10 MEQ tablet Take 10 mEq by mouth 2 (two) times daily.     [provider]  QUEtiapine (SEROQUEL) 25 MG tablet Take 25 mg by mouth at bedtime. 01/03/19   [provider]  sertraline (ZOLOFT) 100 MG tablet Take 150 mg by mouth daily.     [provider]  simvastatin (ZOCOR) 20 MG tablet Take 20 mg by mouth daily.    [provider]    Allergies  Allergen Reactions  . Iohexol Hives     Code: HIVES, Desc: PTS GRANDDAUGHTER [name] CALLED 12/31/08 TO NOTIFY us THAT HER GRANDMOTHER HAD A REACTION TO THE IV DYE 8 HRS POST CT EXAM PT DEVELOPED HIVES/RASH.12/31/08/RM, Onset Date: [id number]   . Morphine Other (See Comments)    confusion    Family History  Problem Relation Age of Onset  . Cancer Mother        unknown type  . Heart attack Brother   . Diabetes Daughter   . Breast cancer Neg Hx     Social History Social History   Tobacco Use  . Smoking status: Never Smoker  . Smokeless tobacco: Never Used  Substance Use Topics  . Alcohol use: No  . Drug use: No    Review of Systems Constitutional: Subjective chills last night, no fever per patient. ENT: Negative for recent illness/congestion Cardiovascular: Negative for chest pain. Respiratory: Negative for shortness of breath. Negative for cough. Gastrointestinal: Moderate diffuse abdominal cramping. Positive for nausea vomiting diarrhea. Musculoskeletal: Negative for musculoskeletal complaints Skin: Negative for skin complaints  Neurological: Negative for headache All other ROS negative  ____________________________________________   PHYSICAL EXAM:  VITAL SIGNS: ED Triage Vitals  Enc Vitals Group     BP 05/14/19 1746 (!) 145/93     Pulse Rate 05/14/19 1746 64     Resp 05/14/19 1746 18     Temp 05/14/19 1746 99.2 F (37.3 C)     Temp Source 05/14/19 1746 Oral     SpO2 05/14/19 1746 100  %     Weight 05/14/19 1741 157 lb (71.2 kg)     Height 05/14/19 1741 5\' 7"  (1.702 m)     Head Circumference --      Peak Flow --      Pain Score 05/14/19 1741 8     Pain Loc --      Pain Edu? --      Excl. in [place]? --     Constitutional: Alert and oriented. Well appearing and in no distress. Eyes: Normal exam ENT      Head: Normocephalic and atraumatic      Mouth/Throat: Mucous membranes are moist. Cardiovascular: Normal rate, regular rhythm. Respiratory: Normal respiratory effort without tachypnea nor retractions. Breath sounds are clear  Gastrointestinal: Soft, mild tenderness especially in the periumbilical and suprapubic areas. No rebound guarding or distention. Musculoskeletal: Nontender with normal range of motion in all  extremities. Neurologic:  Normal speech and language. No gross focal neurologic deficits Skin:  Skin is warm, dry and intact.  Psychiatric: Mood and affect are normal.   ____________________________________________    EKG  EKG viewed and interpreted by myself shows a sinus rhythm at 66 bpm with a narrow QRS, normal axis, largely normal intervals, no concerning ST changes.  ____________________________________________    RADIOLOGY  CT scan shows a large hiatal hernia with a loop of transverse colon herniated as well but no signs of obstruction.  ____________________________________________   INITIAL IMPRESSION / ASSESSMENT AND PLAN / ED COURSE  Pertinent labs & imaging results that were available during my care of the patient were reviewed by me and considered in my medical decision making (see chart for details).   Patient presents emergency department for generalized abdominal pain nausea vomiting diarrhea since yesterday chills last night. Overall patient appears well does have mild tenderness on exam. Low-grade temperature 99.2 in the emergency department. We will check labs, stool studies if patient is able to produce a sample and obtain a CT  abdomen/pelvis to further evaluate. We will IV hydrate treat nausea while awaiting results. Patient agreeable to plan of care. Daughter/granddaughter is here with the patient.  Patient has a urinary tract infection on her work-up.  Her CT shows a large hiatal hernia with part of her transverse colon herniated as well but no signs of obstruction.  This very likely could be the cause of the patient's discomfort as it is quite intermittent.  However given no acute obstruction I do not believe the patient requires hospitalization at this time.  We will place on stool softeners, antibiotics and have the patient follow-up with general surgery in the very near future.  I discussed return precautions.  Patient agreeable to plan of care.   [name] was evaluated in Emergency Department on 05/14/2019 for the symptoms described in the history of present illness. She was evaluated in the context of the global COVID-19 pandemic, which necessitated consideration that the patient might be at risk for infection with the SARS-CoV-2 virus that causes COVID-19. Institutional protocols and algorithms that pertain to the evaluation of patients at risk for COVID-19 are in a state of rapid change based on information released by regulatory bodies including the CDC and federal and state organizations. These policies and algorithms were followed during the patient's care in the ED.  ____________________________________________   FINAL CLINICAL IMPRESSION(S) / ED DIAGNOSES  Abdominal pain Urinary tract infection Hiatal hernia   Harvest Dark, MD 05/14/19 2143

## 2019-06-27 ENCOUNTER — Other Ambulatory Visit: Payer: Self-pay | Admitting: Family Medicine

## 2019-06-27 DIAGNOSIS — Z1231 Encounter for screening mammogram for malignant neoplasm of breast: Secondary | ICD-10-CM

## 2019-07-30 ENCOUNTER — Ambulatory Visit
Admission: RE | Admit: 2019-07-30 | Discharge: 2019-07-30 | Disposition: A | Discharge status: Home / Self Care | Payer: Medicare Other | Source: Ambulatory Visit | Attending: Family Medicine | Admitting: Family Medicine

## 2019-07-30 DIAGNOSIS — Z1231 Encounter for screening mammogram for malignant neoplasm of breast: Secondary | ICD-10-CM | POA: Diagnosis not present

## 2019-08-28 ENCOUNTER — Encounter: Payer: Self-pay | Admitting: Cardiovascular Disease

## 2020-05-20 ENCOUNTER — Encounter: Payer: Self-pay | Admitting: Cardiovascular Disease

## 2020-07-01 ENCOUNTER — Other Ambulatory Visit: Payer: Self-pay | Admitting: Family Medicine

## 2020-07-01 DIAGNOSIS — Z13820 Encounter for screening for osteoporosis: Secondary | ICD-10-CM

## 2020-08-26 ENCOUNTER — Ambulatory Visit
Admission: RE | Admit: 2020-08-26 | Discharge: 2020-08-26 | Disposition: A | Discharge status: Home / Self Care | Payer: Medicare Other | Source: Ambulatory Visit | Attending: Family Medicine | Admitting: Family Medicine

## 2020-08-26 ENCOUNTER — Other Ambulatory Visit: Payer: Self-pay

## 2020-08-26 DIAGNOSIS — Z780 Asymptomatic menopausal state: Secondary | ICD-10-CM | POA: Diagnosis not present

## 2020-08-26 DIAGNOSIS — M810 Age-related osteoporosis without current pathological fracture: Secondary | ICD-10-CM | POA: Insufficient documentation

## 2020-08-26 DIAGNOSIS — Z923 Personal history of irradiation: Secondary | ICD-10-CM | POA: Diagnosis not present

## 2020-08-26 DIAGNOSIS — Z85038 Personal history of other malignant neoplasm of large intestine: Secondary | ICD-10-CM | POA: Insufficient documentation

## 2020-08-26 DIAGNOSIS — Z13820 Encounter for screening for osteoporosis: Secondary | ICD-10-CM | POA: Diagnosis present

## 2020-08-26 DIAGNOSIS — Z853 Personal history of malignant neoplasm of breast: Secondary | ICD-10-CM | POA: Insufficient documentation

## 2020-09-02 ENCOUNTER — Encounter: Payer: Self-pay | Admitting: Cardiovascular Disease

## 2020-09-02 ENCOUNTER — Other Ambulatory Visit: Payer: Self-pay | Admitting: Family Medicine

## 2020-09-02 DIAGNOSIS — Z13820 Encounter for screening for osteoporosis: Secondary | ICD-10-CM

## 2020-10-21 ENCOUNTER — Other Ambulatory Visit: Payer: Self-pay

## 2020-10-21 ENCOUNTER — Emergency Department
Admission: EM | Admit: 2020-10-21 | Discharge: 2020-10-21 | Disposition: A | Discharge status: Home / Self Care | Payer: Medicare Other | Attending: Emergency Medicine | Admitting: Emergency Medicine

## 2020-10-21 ENCOUNTER — Emergency Department: Payer: Medicare Other

## 2020-10-21 DIAGNOSIS — Z853 Personal history of malignant neoplasm of breast: Secondary | ICD-10-CM | POA: Diagnosis not present

## 2020-10-21 DIAGNOSIS — R1013 Epigastric pain: Secondary | ICD-10-CM | POA: Insufficient documentation

## 2020-10-21 DIAGNOSIS — Z7982 Long term (current) use of aspirin: Secondary | ICD-10-CM | POA: Diagnosis not present

## 2020-10-21 DIAGNOSIS — R0789 Other chest pain: Secondary | ICD-10-CM | POA: Insufficient documentation

## 2020-10-21 DIAGNOSIS — Z79899 Other long term (current) drug therapy: Secondary | ICD-10-CM | POA: Diagnosis not present

## 2020-10-21 DIAGNOSIS — Z85038 Personal history of other malignant neoplasm of large intestine: Secondary | ICD-10-CM | POA: Diagnosis not present

## 2020-10-21 DIAGNOSIS — Z8501 Personal history of malignant neoplasm of esophagus: Secondary | ICD-10-CM | POA: Diagnosis not present

## 2020-10-21 DIAGNOSIS — I10 Essential (primary) hypertension: Secondary | ICD-10-CM | POA: Insufficient documentation

## 2020-10-21 DIAGNOSIS — Z96651 Presence of right artificial knee joint: Secondary | ICD-10-CM | POA: Diagnosis not present

## 2020-10-21 DIAGNOSIS — R110 Nausea: Secondary | ICD-10-CM | POA: Diagnosis not present

## 2020-10-21 LAB — BASIC METABOLIC PANEL
Anion gap: 9 (ref 5–15)
BUN: 18 mg/dL (ref 8–23)
CO2: 24 mmol/L (ref 22–32)
Calcium: 9.4 mg/dL (ref 8.9–10.3)
Chloride: 107 mmol/L (ref 98–111)
Creatinine, Ser: 1.13 mg/dL — ABNORMAL HIGH (ref 0.44–1.00)
GFR, Estimated: 49 mL/min — ABNORMAL LOW (ref >60)
Glucose, Bld: 97 mg/dL (ref 70–99)
Potassium: 4.1 mmol/L (ref 3.5–5.1)
Sodium: 140 mmol/L (ref 135–145)

## 2020-10-21 LAB — CBC
HCT: 39.2 % (ref 36.0–46.0)
Hemoglobin: 12.8 g/dL (ref 12.0–15.0)
MCH: 29.8 pg (ref 26.0–34.0)
MCHC: 32.7 g/dL (ref 30.0–36.0)
MCV: 91.4 fL (ref 80.0–100.0)
Platelets: 187 10*3/uL (ref 150–400)
RBC: 4.29 MIL/uL (ref 3.87–5.11)
RDW: 14.5 % (ref 11.5–15.5)
WBC: 5.5 10*3/uL (ref 4.0–10.5)
nRBC: 0 % (ref 0.0–0.2)

## 2020-10-21 LAB — TROPONIN I (HIGH SENSITIVITY)
Troponin I (High Sensitivity): 8 ng/L (ref <18)
Troponin I (High Sensitivity): 8 ng/L (ref <18)

## 2020-10-21 MED ORDER — FAMOTIDINE 20 MG PO TABS: 20.0000 mg | ORAL_TABLET | Freq: Every day | ORAL | 0 refills | Status: AC

## 2020-10-21 MED ORDER — OMEPRAZOLE 40 MG PO CPDR: 40.0000 mg | DELAYED_RELEASE_CAPSULE | Freq: Two times a day (BID) | ORAL | 0 refills | Status: DC

## 2020-10-21 NOTE — Discharge Instructions (Addendum)
For your possible stomach pain: - Take OMEPRAZOLE twice a day. Take this first thing in the AM before eating, and with dinner - Take the FAMOTIDINE/PEPCID once a day with breakfast - Eat FREQUENT, SMALL meals - Follow up with your doctor in 1 week  Follow-up with Dr. Rockey Situ in the next 1-2 weeks as well

## 2020-10-21 NOTE — ED Triage Notes (Signed)
Pt states she has been having chest pain that has been moving around for a month. Pt states today the pain was worse today. Pt denies N/V and dizziness.

## 2020-10-21 NOTE — ED Provider Notes (Signed)
[facility] Emergency Department Provider Note  ____________________________________________   Event Date/Time   First MD Initiated Contact with Patient 10/21/20 1506     (approximate)  I have reviewed the triage vital signs and the nursing notes.   HISTORY  Chief Complaint Chest Pain    HPI [name] is a 80 y.o. female here with intermittent, transient, short-lived chest pain.  The patient states that for the last several months, she has had increasingly frequent episodes in which she feels an acute, pressure-like sensation in her chest and epigastric area.  She feels like this occasionally causes her to stop to catch her breath.  The pain seems to resolve spontaneously over no more than 2 to 3 minutes.  These episodes seem to be increasing in frequency.  She occasionally has them when she wakes up from sleep.  She has had some mild nausea with it.  No association with exertion or eating or drinking.  No fevers or chills.  No other complaints.        Past Medical History:  Diagnosis Date  . Anxiety state, unspecified   . Arthropathy, unspecified, site unspecified   . Benign carcinoid tumor of the duodenum   . Blindness of one eye   . Breast cancer ([place]) 2009  . Cancer [facility])    breast  . Colon cancer ([place])   . Depressive disorder, not elsewhere classified   . Diarrhea   . Disease of gallbladder   . Esophageal cancer ([place])   . GERD (gastroesophageal reflux disease)   . Headache(784.0)   . Hyperlipidemia   . Hypertension   . Kidney stones   . OA (osteoarthritis) of knee    right  . Personal history of radiation therapy   . Syncope and collapse   . Unspecified ectopic pregnancy without intrauterine pregnancy     Patient Active Problem List   Diagnosis Date Noted  . Chronic diarrhea 12/28/2017  . 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 SBO (small bowel obstruction) ([place]) 12/03/2017  . GERD (gastroesophageal reflux  disease) 12/03/2017  . TBI (traumatic brain injury) ([place]) 02/29/2016  . 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 Memory loss 02/13/2016  . Chronic headache 02/13/2016  . Snoring 02/13/2016  . Daytime somnolence 02/13/2016  . Bradycardia 03/11/2014  . Preop cardiovascular exam 05/22/2012  . SYNCOPE AND COLLAPSE 07/13/2010  . Hyperlipidemia 03/29/2010  . Chest pain 10/20/2009  . Nonspecific (abnormal) findings on radiological and other examination of gastrointestinal tract 11/12/2007  . BENIGN CARCINOID TUMOR OF THE DUODENUM 11/11/2007  . Anxiety 11/11/2007  . Depression 11/11/2007  . Essential hypertension 11/11/2007  . ECTOPIC PREGNANCY 11/11/2007  . ARTHRITIS 11/11/2007  . HEADACHE 11/11/2007  . DIARRHEA, CHRONIC 11/11/2007    Past Surgical History:  Procedure Laterality Date  . BREAST BIOPSY Bilateral   . BREAST EXCISIONAL BIOPSY Right 2009   (+)  . BREAST SURGERY Right   . Cataract surgery Left 10/11/2017   Implant  . CHOLECYSTECTOMY  05-27-12  . COLON RESECTION    . COLONOSCOPY    . PARTIAL HYSTERECTOMY    . TOTAL KNEE ARTHROPLASTY Right     Prior to Admission medications   Medication Sig Start Date End Date Taking? Authorizing Provider  famotidine (PEPCID) 20 MG tablet Take 1 tablet (20 mg total) by mouth daily. 10/21/20 11/20/20 Yes Duffy Bruce, MD  amLODipine (NORVASC) 10 MG tablet Take 10 mg by mouth daily.  [provider]  aspirin 81 MG EC tablet Take 81 mg by mouth daily.     [provider]  brimonidine (ALPHAGAN) 0.2 % ophthalmic solution Place 1 drop into both eyes 2 (two) times daily.    [provider]  cephALEXin (KEFLEX) 500 MG capsule Take 1 capsule (500 mg total) by mouth 3 (three) times daily. 05/14/19   Harvest Dark, MD  Cyanocobalamin (VITAMIN B 12 PO) Take 500 mcg by mouth 2 (two) times daily.     [provider]  dorzolamide (TRUSOPT) 2 % ophthalmic solution  Place 1 drop into both eyes 2 (two) times daily.    [provider]  latanoprost (XALATAN) 0.005 % ophthalmic solution Place 1 drop into both eyes at bedtime.    [provider]  memantine (NAMENDA) 5 MG tablet Take 1 tablet by mouth daily.    [provider]  omeprazole (PRILOSEC) 40 MG capsule Take 1 capsule (40 mg total) by mouth in the morning and at bedtime. 10/21/20 11/20/20  Duffy Bruce, MD  potassium chloride (K-DUR,KLOR-CON) 10 MEQ tablet Take 10 mEq by mouth 2 (two) times daily.     [provider]  QUEtiapine (SEROQUEL) 25 MG tablet Take 25 mg by mouth at bedtime. 01/03/19   [provider]  sertraline (ZOLOFT) 100 MG tablet Take 150 mg by mouth daily.     [provider]  simvastatin (ZOCOR) 20 MG tablet Take 20 mg by mouth daily.    [provider]    Allergies Iohexol and Morphine  Family History  Problem Relation Age of Onset  . Cancer Mother        unknown type  . Heart attack Brother   . Diabetes Daughter   . Breast cancer Neg Hx     Social History Social History   Tobacco Use  . Smoking status: Never Smoker  . Smokeless tobacco: Never Used  Substance Use Topics  . Alcohol use: No  . Drug use: No    Review of Systems  Review of Systems  Constitutional: Positive for fatigue. Negative for fever.  HENT: Negative for congestion and sore throat.   Eyes: Negative for visual disturbance.  Respiratory: Positive for shortness of breath. Negative for cough.   Cardiovascular: Positive for chest pain.  Gastrointestinal: Positive for nausea. Negative for abdominal pain, diarrhea and vomiting.  Genitourinary: Negative for flank pain.  Musculoskeletal: Negative for back pain and neck pain.  Skin: Negative for rash and wound.  Neurological: Negative for weakness.  All other systems reviewed and are negative.    ____________________________________________  PHYSICAL EXAM:      VITAL SIGNS: ED Triage  Vitals  Enc Vitals Group     BP 10/21/20 1215 (!) 137/118     Pulse Rate 10/21/20 1215 71     Resp 10/21/20 1215 15     Temp 10/21/20 1215 98.5 F (36.9 C)     Temp Source 10/21/20 1215 Oral     SpO2 10/21/20 1215 96 %     Weight 10/21/20 1458 156 lb 12 oz (71.1 kg)     Height 10/21/20 1458 5\' 3"  (1.6 m)     Head Circumference --      Peak Flow --      Pain Score 10/21/20 1216 2     Pain Loc --      Pain Edu? --      Excl. in [place]? --      Physical Exam Vitals and nursing  note reviewed.  Constitutional:      General: She is not in acute distress.    Appearance: She is well-developed.  HENT:     Head: Normocephalic and atraumatic.  Eyes:     Conjunctiva/sclera: Conjunctivae normal.  Cardiovascular:     Rate and Rhythm: Normal rate and regular rhythm.     Heart sounds: Normal heart sounds. No murmur heard. No friction rub.  Pulmonary:     Effort: Pulmonary effort is normal. No respiratory distress.     Breath sounds: Normal breath sounds. No wheezing or rales.  Abdominal:     General: There is no distension.     Palpations: Abdomen is soft.     Tenderness: There is no abdominal tenderness.     Comments: Mild epigastric tenderness, no rebound or guarding  Musculoskeletal:     Cervical back: Neck supple.     Right lower leg: No edema.     Left lower leg: No edema.  Skin:    General: Skin is warm.     Capillary Refill: Capillary refill takes less than 2 seconds.  Neurological:     Mental Status: She is alert and oriented to person, place, and time.     Motor: No abnormal muscle tone.       ____________________________________________   LABS (all labs ordered are listed, but only abnormal results are displayed)  Labs Reviewed  BASIC METABOLIC PANEL - Abnormal; Notable for the following components:      Result Value   Creatinine, Ser 1.13 (*)    GFR, Estimated 49 (*)    All other components within normal limits  CBC  TROPONIN I (HIGH SENSITIVITY)  TROPONIN I  (HIGH SENSITIVITY)    ____________________________________________  EKG: Normal sinus rhythm, ventricular rate 67.  PR 198, QRS 78, QTc 433.  No acute ST elevations or depressions. ________________________________________  RADIOLOGY All imaging, including plain films, CT scans, and ultrasounds, independently reviewed by me, and interpretations confirmed via formal radiology reads.  ED MD interpretation:   Chest x-ray: No acute disease  Official radiology report(s): DG Chest 2 View  Result Date: 10/21/2020 CLINICAL DATA:  Chest pain. EXAM: CHEST - 2 VIEW COMPARISON:  08/16/2016 chest radiographs in 08/17/2016 chest CTA FINDINGS: The cardiac silhouette remains mildly enlarged. A large hiatal hernia is again noted. The thoracic aorta is tortuous. There is chronic anterior eventration of the right hemidiaphragm. Minimal atelectasis or scarring is noted in the lung bases. No segmental airspace consolidation, edema, pleural effusion, pneumothorax is identified. Right upper quadrant abdominal surgical clips and thoracic dextroscoliosis are noted. IMPRESSION: No active cardiopulmonary disease. Electronically Signed   By: Logan Bores M.D.   On: 10/21/2020 13:27    ____________________________________________  PROCEDURES   Procedure(s) performed (including Critical Care):  Procedures  ____________________________________________  INITIAL IMPRESSION / MDM / [place] / ED COURSE  As part of my medical decision making, I reviewed the following data within the [place] notes reviewed and incorporated, Old chart reviewed, Notes from prior ED visits, and [place] Controlled Substance Database       *SIRAH SLAYMAKER was evaluated in Emergency Department on 10/21/2020 for the symptoms described in the history of present illness. She was evaluated in the context of the global COVID-19 pandemic, which necessitated consideration that the patient might be at risk for  infection with the SARS-CoV-2 virus that causes COVID-19. Institutional protocols and algorithms that pertain to the evaluation of patients at risk for COVID-19  are in a state of rapid change based on information released by regulatory bodies including the CDC and federal and state organizations. These policies and algorithms were followed during the patient's care in the ED.  Some ED evaluations and interventions may be delayed as a result of limited staffing during the pandemic.*     Medical Decision Making: 80 year old well-appearing female here with atypical chest pain.  Clinically, I suspect this could be related to her chronic hiatal hernia and history of esophageal cancer with possible esophageal spasm.  It seems random, transient, and very short in duration.  It is not associated with exertion.  EKG reviewed and is nonischemic and troponins are negative x2.  Chest x-ray is clear.  No known history of CAD.  She does also have a history of intermittent atypical chest pain which could be related.  Discussed with Dr. Rockey Situ, patient's cardiologist, she will follow-up for outpatient stress testing.  No signs of ACS, PE, or emergent pathology at this time.  Her abdomen is soft and nontender.  She is tolerating p.o.  Will increase her antacids, discussed dietary changes, and will refer to her PCP for possible GI follow-up if needed.  ____________________________________________  FINAL CLINICAL IMPRESSION(S) / ED DIAGNOSES  Final diagnoses:  Atypical chest pain     MEDICATIONS GIVEN DURING THIS VISIT:  Medications - No data to display   ED Discharge Orders         Ordered    omeprazole (PRILOSEC) 40 MG capsule  2 times daily        10/21/20 1639    famotidine (PEPCID) 20 MG tablet  Daily        10/21/20 1639           Note:  This document was prepared using Dragon voice recognition software and may include unintentional dictation errors.   Duffy Bruce, MD 10/21/20 [phone]

## 2020-10-21 NOTE — ED Notes (Signed)
Rockey Situ, MD is patients cardiologist.

## 2020-10-21 NOTE — ED Notes (Signed)
Pt ambulatory to restroom with steady gait, assisted back to bed. Family remains at bedside. Stretcher locked in low position with side rails up x2, call light in reach.

## 2020-11-29 NOTE — Progress Notes (Signed)
Cardiology Office Note  Date:  11/30/2020   ID:  [name], DOB 1941/02/19, MRN [id number]  PCP:  Denton Lank, MD   Chief Complaint  Patient presents with   New Patient (Initial Visit)    Follow up [facility]; chest pain and dizziness. Medications reviewed by the patient verbally.     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   causes her to stop to catch her breath.   resolve spontaneously over no more than 2 to 3 minutes.   occasionally has them when she wakes up from sleep.    Symptoms noted in the emergency room have resolved since she has started taking her PPI on a regular basis, now takes omeprazole 40 daily Pepcid for breakthrough  Denies any chest pain on exertion concerning for angina  Hx of falls, Sedentary No regular exercise program  EKG personally reviewed by myself on todays visit Normal sinus rhythm rate 58 bpm no significant ST-T wave changes  Other past medical history Remote history, at the eye doctor.  Had acute abdominal pain, nausea.   requested to a family member to get her a glass of water.  When they came back, she was on the ground, had vomited all over herself.  Further evaluation showed heart rate in the 40s, low blood pressure. Sugars were 110.  had some fluids, recovered and then went home.   Prior episode of near syncope or syncope happened  when she was a passenger in a car and she acutely had lightheadedness, near syncope. Previously amlodipine was held in the setting of orthostasis and syncope.   dramatic weight loss after her cholecystectomy. She has chronic diarrhea. Being managed by Dr. Ardis Hughs. She reports she has tried cholestyramine before, Imodium is not working.   bradycardia during the colonoscopy procedure. Records indicate heart rates in the 40s and 50s. Blood pressure dropped after anesthesia  Previous Event monitor was performed that showed APCs with normal rhythm. Previous event monitor in early 08-23-10 did not show any significant arrhythmia  Previous admission to the hospital for abdominal pain found to have gallbladder disease. She scheduled for gallbladder surgery in several days' time.    She had a Lexiscan myoview in 5/11.  This showed breast attenuation artifact in the anterior region though they could not definitively exclude mild ischemia.    total knee replacement on the right in November 2011. She has been participating in physical therapy. 2 near syncopal episodes while performing aggressive physical therapy while in a standing position. Both episodes, she was hypotensive. First episode she had pressure of 90/50, second episode blood pressure was 79/50. IV fluid was started on a second episode. The second episode was on June 30 2011  PMH:   has a past medical history of Anxiety state, unspecified, Arthropathy, unspecified, site unspecified, Benign carcinoid tumor of the duodenum, Blindness of one eye, Breast cancer ([place]) (2009), Cancer [facility]), Colon cancer ([place]), Depressive disorder, not elsewhere classified, Diarrhea, Disease of gallbladder, Esophageal cancer ([place]), GERD (gastroesophageal reflux disease), Headache(784.0),  Hyperlipidemia, Hypertension, Kidney stones, OA (osteoarthritis) of knee, Personal history of radiation therapy, Syncope and collapse, and Unspecified ectopic pregnancy without intrauterine pregnancy.  PSH:    Past Surgical History:  Procedure Laterality Date   BREAST BIOPSY Bilateral    BREAST EXCISIONAL BIOPSY Right 2009   (+)   BREAST SURGERY Right    Cataract surgery Left 10/11/2017   Implant   CHOLECYSTECTOMY  05-27-12   COLON RESECTION     COLONOSCOPY     PARTIAL HYSTERECTOMY     TOTAL KNEE ARTHROPLASTY Right     Current Outpatient Medications  Medication Sig Dispense Refill   alendronate (FOSAMAX) 70 MG tablet alendronate 70 mg tablet  TAKE 1 TABLET BY MOUTH ONCE A WEEK     amLODipine (NORVASC) 10 MG tablet Take 10 mg by mouth daily.     aspirin 81 MG EC tablet Take 81 mg by mouth daily.      brimonidine (ALPHAGAN) 0.2 % ophthalmic solution brimonidine 0.2 % eye drops     dorzolamide (TRUSOPT) 2 % ophthalmic solution Place 1 drop into both eyes 2 (two) times daily.     famotidine (PEPCID) 20 MG tablet Take 1 tablet (20 mg total) by mouth daily. 30 tablet 0   latanoprost (XALATAN) 0.005 % ophthalmic solution Place 1 drop into both eyes at bedtime.     omeprazole (PRILOSEC) 40 MG capsule Take 1 capsule (40 mg total) by mouth in the morning and at bedtime. 60 capsule 0   timolol (TIMOPTIC) 0.5 % ophthalmic solution timolol maleate 0.5 % eye drops     sertraline (ZOLOFT) 100 MG tablet Take 150 mg by mouth daily.  (Patient not taking: Reported on 11/30/2020)     No current facility-administered medications for this visit.     Allergies:   Iodinated diagnostic agents, Iohexol, and Morphine   Social History:  The patient  reports that she has never smoked. She has never used smokeless tobacco. She reports that she does not drink alcohol and does not use drugs.   Family History:   family history includes Cancer in her mother; Diabetes in her daughter; Heart attack in her  brother.    Review of Systems: Review of Systems  Constitutional: Negative.   HENT: Negative.    Respiratory: Negative.    Cardiovascular:  Positive for chest pain.  Gastrointestinal: Negative.   Musculoskeletal: Negative.   Neurological: Negative.   Psychiatric/Behavioral: Negative.    All other systems reviewed and are negative.   PHYSICAL EXAM: VS:  BP 128/82 (BP Location: Right Arm, Patient Position: Sitting, Cuff Size: Normal)   Pulse (!) 58   Ht 5\' 7"  (1.702 m)   Wt 150 lb 4 oz (68.2 kg)   SpO2 98%   BMI 23.53 kg/m  , BMI Body mass index is 23.53 kg/m. GEN: Well nourished, well developed, in no acute distress HEENT: normal Neck: no JVD, carotid bruits, or masses Cardiac: RRR; no murmurs, rubs, or gallops,no edema  Respiratory:  clear to auscultation bilaterally, normal work of breathing GI: soft, nontender, nondistended, + BS MS: no deformity or atrophy Skin: warm and dry, no rash Neuro:  Strength and sensation are intact Psych: euthymic mood,  full affect    Recent Labs: 10/21/2020: BUN 18; Creatinine, Ser 1.13; Hemoglobin 12.8; Platelets 187; Potassium 4.1; Sodium 140    Lipid Panel No results found for: CHOL, HDL, LDLCALC, TRIG    Wt Readings from Last 3 Encounters:  11/30/20 150 lb 4 oz (68.2 kg)  10/21/20 156 lb 12 oz (71.1 kg)  05/14/19 157 lb (71.2 kg)       ASSESSMENT AND PLAN:  Problem List Items Addressed This Visit       Cardiology Problems   Essential hypertension   Relevant Orders   EKG 12-Lead     Other   Chest pain - Primary   Relevant Orders   EKG 12-Lead   Atypical chest pain Seen in emergency room, records requested and reviewed, Presents with her daughter today, feels the symptoms have resolved since she has been taking her omeprazole 40 mg daily on a regular basis Presentation is somewhat atypical, diffuse to the chest, at rest, not associated with exertion Work-up as below  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 No changes made to the medications.     Total encounter time more than 45 minutes  Greater than 50% was spent in counseling and coordination of care with the patient    Signed, Esmond Plants, M.D., Ph.D. [place], [place]

## 2020-11-30 ENCOUNTER — Encounter: Payer: Self-pay | Admitting: Cardiovascular Disease

## 2020-11-30 ENCOUNTER — Other Ambulatory Visit: Payer: Self-pay

## 2020-11-30 ENCOUNTER — Ambulatory Visit: Payer: Medicare Other | Admitting: Cardiovascular Disease

## 2020-11-30 VITALS — BP 128/82 | HR 58 | Ht 67.0 in | Wt 150.2 lb

## 2020-11-30 DIAGNOSIS — R079 Chest pain, unspecified: Secondary | ICD-10-CM | POA: Diagnosis not present

## 2020-11-30 DIAGNOSIS — I10 Essential (primary) hypertension: Secondary | ICD-10-CM

## 2020-11-30 DIAGNOSIS — I77810 Thoracic aortic ectasia: Secondary | ICD-10-CM | POA: Diagnosis not present

## 2020-11-30 NOTE — Patient Instructions (Addendum)
Medication Instructions:  No changes, please continue your current medications   If you need a refill on your cardiac medications before your next appointment, please call your pharmacy.   Lab work: Will get from PCP  Testing/Procedures: ECHO ( dilated ascending aorta, chest pain)  Your physician has requested that you have an echocardiogram. Echocardiography is a painless test that uses sound waves to create images of your heart. It provides your doctor with information about the size and shape of your heart and how well your heart's chambers and valves are working. This procedure takes approximately one hour. There are no restrictions for this procedure.  There is a possibility that an IV may need to be started during your test to inject an image enhancing agent. This is done to obtain more optimal pictures of your heart. Therefore we ask that you do at least drink some water prior to coming in to hydrate your veins.     Follow-Up: At [facility], you and your health needs are our priority.  As part of our continuing mission to provide you with exceptional heart care, we have created designated Provider Care Teams.  These Care Teams include your primary Cardiologist (physician) and Advanced Practice Providers (APPs -  Physician Assistants and Nurse Practitioners) who all work together to provide you with the care you need, when you need it.  You will need a follow up appointment as needed  Providers on your designated Care Team:   Murray Hodgkins, NP Christell Faith, PA-C Marrianne Mood, PA-C Cadence [place], [place]  COVID-19 Vaccine Information can be found at: ShippingScam.co.uk For questions related to vaccine distribution or appointments, please email vaccine@[email or web address] or call [phone])[phone].

## 2020-12-17 ENCOUNTER — Other Ambulatory Visit: Payer: Self-pay

## 2020-12-17 ENCOUNTER — Ambulatory Visit: Payer: Medicare Other | Admitting: Podiatry

## 2020-12-17 DIAGNOSIS — M79674 Pain in right toe(s): Secondary | ICD-10-CM

## 2020-12-17 DIAGNOSIS — M79675 Pain in left toe(s): Secondary | ICD-10-CM | POA: Diagnosis not present

## 2020-12-17 DIAGNOSIS — B351 Tinea unguium: Secondary | ICD-10-CM

## 2020-12-17 NOTE — Progress Notes (Signed)
   SUBJECTIVE Patient presents to office today complaining of elongated, thickened nails that cause pain while ambulating in shoes.  Patient is unable to trim their own nails. Patient is here for further evaluation and treatment.  Past Medical History:  Diagnosis Date   Anxiety state, unspecified    Arthropathy, unspecified, site unspecified    Benign carcinoid tumor of the duodenum    Blindness of one eye    Breast cancer (HCC) 2009   Cancer (HCC)    breast   Colon cancer (HCC)    Depressive disorder, not elsewhere classified    Diarrhea    Disease of gallbladder    Esophageal cancer (HCC)    GERD (gastroesophageal reflux disease)    Headache(784.0)    Hyperlipidemia    Hypertension    Kidney stones    OA (osteoarthritis) of knee    right   Personal history of radiation therapy    Syncope and collapse    Unspecified ectopic pregnancy without intrauterine pregnancy     OBJECTIVE General Patient is awake, alert, and oriented x 3 and in no acute distress. Derm Skin is dry and supple bilateral. Negative open lesions or macerations. Remaining integument unremarkable. Nails are tender, long, thickened and dystrophic with subungual debris, consistent with onychomycosis, 1-5 bilateral. No signs of infection noted. Vasc  DP and PT pedal pulses palpable bilaterally. Temperature gradient within normal limits.  Neuro Epicritic and protective threshold sensation grossly intact bilaterally.  Musculoskeletal Exam No symptomatic pedal deformities noted bilateral. Muscular strength within normal limits.  ASSESSMENT 1.  Pain due to onychomycosis of toenails both  PLAN OF CARE 1. Patient evaluated today.  2. Instructed to maintain good pedal hygiene and foot care.  3. Mechanical debridement of nails 1-5 bilaterally performed using a nail nipper. Filed with dremel without incident.  4. Return to clinic in 3 mos.    Jodeci Roarty M. Charisa Twitty, DPM [facility]  Dr. Abran Gavigan M. Shyquan Stallbaumer, DPM     [address]                                     [place], [place] [address]                Office [phone]  Fax [phone]    

## 2020-12-30 ENCOUNTER — Ambulatory Visit (INDEPENDENT_AMBULATORY_CARE_PROVIDER_SITE_OTHER): Payer: Medicare Other

## 2020-12-30 ENCOUNTER — Other Ambulatory Visit: Payer: Self-pay

## 2020-12-30 DIAGNOSIS — I77810 Thoracic aortic ectasia: Secondary | ICD-10-CM | POA: Diagnosis not present

## 2020-12-30 DIAGNOSIS — R079 Chest pain, unspecified: Secondary | ICD-10-CM | POA: Diagnosis not present

## 2021-01-01 LAB — ECHOCARDIOGRAM COMPLETE
AR max vel: 3.15 cm2
AV Area VTI: 3.17 cm2
AV Area mean vel: 2.87 cm2
AV Mean grad: 3 mmHg
AV Peak grad: 6.7 mmHg
Ao pk vel: 1.29 m/s
Area-P 1/2: 3.95 cm2
P 1/2 time: 626 msec
S' Lateral: 2.8 cm
Single Plane A4C EF: 53.6 %

## 2021-01-11 ENCOUNTER — Encounter: Payer: Self-pay | Admitting: Neurology

## 2021-01-11 ENCOUNTER — Other Ambulatory Visit: Payer: Self-pay

## 2021-01-11 ENCOUNTER — Other Ambulatory Visit: Payer: Self-pay | Admitting: Neurology

## 2021-01-11 ENCOUNTER — Ambulatory Visit: Payer: Medicare Other | Admitting: Neurology

## 2021-01-11 VITALS — BP 162/94 | HR 71 | Ht 67.0 in | Wt 147.0 lb

## 2021-01-11 DIAGNOSIS — R4189 Other symptoms and signs involving cognitive functions and awareness: Secondary | ICD-10-CM

## 2021-01-11 DIAGNOSIS — F688 Other specified disorders of adult personality and behavior: Secondary | ICD-10-CM

## 2021-01-11 DIAGNOSIS — G309 Alzheimer's disease, unspecified: Secondary | ICD-10-CM | POA: Diagnosis not present

## 2021-01-11 DIAGNOSIS — F0391 Unspecified dementia with behavioral disturbance: Secondary | ICD-10-CM | POA: Diagnosis not present

## 2021-01-11 DIAGNOSIS — R410 Disorientation, unspecified: Secondary | ICD-10-CM

## 2021-01-11 NOTE — Progress Notes (Signed)
[facility]    Provider:  Dr  Referring Provider: Patel, Sarah, MD Primary Care Physician:  Patel, Sarah, MD  CC:  Memory loss   HPI: memory loss.  We seen patient in the past for memory loss but its been over 3 years.  She has a past medical history of traumatic brain injury, hypertension, headaches, Bradycardia, syncope and collapse, daytime somnolence, memory loss, snoring, hyperlipidemia.  In the past we started her on Aricept and she could not tolerate it.  MMSE is in the past and fluctuated between 20-25/30.  Patient had a sleep evaluation in the past without significant findings.  I reviewed notes, when we first saw her in 2017 she was here with her granddaughter and daughter who stated she repeated herself which it started 2 years prior, may be brother in his 70s with memory loss but no history of dementia, it was more short-term memory forgetting things like names and faces.  She was not driving at the time.  She was needing help for bills.  MRI of the brain in the past showed mild atrophy and chronic microvascular ischemic changes with tiny right thalamic focus of chronic hemorrhage and she was advised to monitor blood pressure.  We sent her to PT for gait and safety.  I also reviewed notes from [facility], recent labs in March of this year showed a BUN of 14, creatinine 1.24, EGFR 44, vitamin D very low at 21, B12 368, repeat MRI in the past September 2018 was normal for age.  Here with her granddaughter, memory is worsening, granddaughter provides muchinformation, she forgets conversations, patient states she doesn't forget things at home or any accidents in the home, they don;t let her use the stove anymore per granddaughter they unplug the stove, family is there often, more short-term memory loss, progressive, ongoing for at least 5 years, she continues to forget things, she is more angry all the time, less social, personality changes,  irritable, not driving. She refuses social, won;t go to to an adult daycare, family pays the bills, unclear family history of dementia but brother had memory issues and didn;t know who patient was.    MRI brain 2018: reviewed images and agree: IMPRESSION: 1.  No metastatic disease or acute intracranial abnormality. 2. Stable since 2017 and largely unremarkable for age MRI appearance of the brain  Interval history 10/05/2016: She still has headaches sometimes. She have headaches at least 3x a week.  Here with her granddaughter [name]. She could not tolerate the Aricept. She refuses anymore medications. Could benefit from continued PT. Will send [facility] back. Wants [facility] same people that were there before.   Interval history 07/06/2016: Patient returns today. MMSE improved from 20/30 to 25/30. She wants to discuss a new problem headaches. She has had a few falls in her home. A toy was on the floor and she just fell on the floor but didn;t hit anything. Sleep study was negative. She has headaches. She has "high pressures in my eyes" and that is being treated. Headaches are in the frontal areas. She has them every day. Ibuprofen helps. She takes it every once in a while. She has been complaining of the headaches and dizziness more frequently. Daughter feels something is different. They are going to see the eye doctor and her primary care she feels she is lightheaded.  She has headaches when she gets up and the headaches are there tea helps. She has always had headaches and sometimes   they are worse. She has had headaches all her life. She used to have migraines.She gets agitated. She does drinka  Lot of fluids.    Ward Givens 03/2016: HISTORY OF PRESENT ILLNESS: Ms. Towell is a 81 year old female with a history of confusional arousals, snoring and sleep disturbance. She returns today to discuss her ONO results. I explained to the patient that Dr. Brett Fairy has reviewed her ONO study and reports that it is normal.  She does not qualify for supplemental oxygen at night. Patient verbalized understanding. At the last visit Dr. Brett Fairy encouraged the patient to use melatonin before bedtime. Her granddaughters with her and reports that they forgot to pick this up. They are willing to give this a try. She was also encouraged to participate in a senior citizen's group. She states that she's been going with a friend to a church group. At this group they do things such as watching movies and eating. Patient overall feels that she is doing okay. In the past she has been given Aricept however this caused vivid dreams. She is no longer on Aricept.     HPI 02/2016:  LYRIK DOCKSTADER is a 80 y.o. female here as a referral from Dr. Posey Pronto for memory loss. Past medical history of hypertension, high cholesterol, migraine, depression, anxiety, breast cancer and colon cancer. Here with daughter and granddaughter who provide most information. She loses things. She repeats herself. Started 2 years ago and worsening. She had a brother in his 66s maybe with memory loss but also had a stroke, no FHx of Alzheimer's dementia. More short-term memory loss. She forgets things and later remembers it like names. She forgets names and faces. She has forgotten things that they discussed multiple times over the years and patient does not remember. A lot of little things. She lives alone. She does not drive. Patient pays the bills but needs help now. She needs help getting her medications. She does not want to eat. Her husband just dies a year ago and they were married 50 years ago. She has had migraine headaches for years and she has had chronic headaches for years since daughter remembers and now they feel like daily pressure hedaches. Granddaughter thinks she is depressed. Says that patient will repeat things even if she just told them earlier. She hides things and can;t find them. Headaches are in the morning when she wakes up every day. She snores badly  per family. Headaches are daily she wakes with them every morning.     Reviewed MRi brain images personally and with patient and family: Mild atrophy and chronic microvascular ischemic change. Tiny RIGHT thalamic focus chronic hemorrhage, likely sequelae of hypertensive cerebrovascular disease.   No acute intracranial findings are evident.     Reviewed notes, labs and imaging from outside physicians, which showed:    Labs drawn 12/30/2015: CMP was unremarkable with creatinine 0.91, B12 was 1037, folate 13.2.      Her primary care notes, she has been depressed, flat affect sometimes tearful, doesn't want to talk to counselor may be in the future. Patient has headache on and off. She suspected tension headache. Tried stretching increasing fluids for hydration, may take Tylenol, MRI was ordered.   MRI of the brain (personally reviewed images 01/22/2016) and agree with the following: Mild atrophy and chronic microvascular ischemic change. Tiny RIGHT thalamic focus chronic hemorrhage, likely sequelae of hypertensive cerebrovascular disease.   No acute intracranial findings are evident. Review of Systems: Patient complains of symptoms per  HPI as well as the following symptoms: irritability . Pertinent negatives and positives per HPI. All others negative    Social History   Socioeconomic History   Marital status: Widowed    Spouse name: Not on file   Number of children: Not on file   Years of education: Not on file   Highest education level: 6th grade  Occupational History   Occupation: Retired  Tobacco Use   Smoking status: Never   Smokeless tobacco: Never  Vaping Use   Vaping Use: Never used  Substance and Sexual Activity   Alcohol use: No   Drug use: No   Sexual activity: Not on file  Other Topics Concern   Not on file  Social History Narrative   Lives alone   Caffeine use: <1 cup coffee/day, 2-3 pepsi per day   Social Determinants of Health   Financial Resource Strain:  Not on file  Food Insecurity: Not on file  Transportation Needs: Not on file  Physical Activity: Not on file  Stress: Not on file  Social Connections: Not on file  Intimate Partner Violence: Not on file    Family History  Problem Relation Age of Onset   Cancer Mother        unknown type   Cancer Father    Heart attack Brother    Cancer Paternal Grandmother    Diabetes Daughter    Breast cancer Neg Hx     Past Medical History:  Diagnosis Date   Anxiety state, unspecified    Arthropathy, unspecified, site unspecified    Benign carcinoid tumor of the duodenum    Blindness of one eye    Breast cancer ([place]) 2009   Cancer ([place])    breast   Colon cancer ([place])    Depressive disorder, not elsewhere classified    Diarrhea    Disease of gallbladder    Esophageal cancer ([place])    GERD (gastroesophageal reflux disease)    Headache(784.0)    Hyperlipidemia    Hypertension    Kidney stones    OA (osteoarthritis) of knee    right   Personal history of radiation therapy    Syncope and collapse    Unspecified ectopic pregnancy without intrauterine pregnancy     Past Surgical History:  Procedure Laterality Date   BREAST BIOPSY Bilateral    BREAST EXCISIONAL BIOPSY Right 2009   (+)   BREAST SURGERY Right    Cataract surgery Left 10/11/2017   Implant   CHOLECYSTECTOMY  05-27-12   COLON RESECTION     COLONOSCOPY     PARTIAL HYSTERECTOMY     TOTAL KNEE ARTHROPLASTY Right     Current Outpatient Medications  Medication Sig Dispense Refill   alendronate (FOSAMAX) 70 MG tablet alendronate 70 mg tablet  TAKE 1 TABLET BY MOUTH ONCE A WEEK     amLODipine (NORVASC) 10 MG tablet Take 10 mg by mouth daily.     aspirin 81 MG EC tablet Take 81 mg by mouth daily.      brimonidine (ALPHAGAN) 0.2 % ophthalmic solution brimonidine 0.2 % eye drops     dorzolamide (TRUSOPT) 2 % ophthalmic solution Place 1 drop into both eyes 2 (two) times daily.     latanoprost (XALATAN) 0.005 % ophthalmic  solution Place 1 drop into both eyes at bedtime.     sertraline (ZOLOFT) 100 MG tablet Take 150 mg by mouth daily.     famotidine (PEPCID) 20 MG tablet Take 1 tablet (20 mg  total) by mouth daily. 30 tablet 0   omeprazole (PRILOSEC) 40 MG capsule Take 1 capsule (40 mg total) by mouth in the morning and at bedtime. 60 capsule 0   timolol (TIMOPTIC) 0.5 % ophthalmic solution timolol maleate 0.5 % eye drops     No current facility-administered medications for this visit.    Allergies as of 01/11/2021 - Review Complete 01/11/2021  Allergen Reaction Noted   Iodinated diagnostic agents Hives 12/31/2008   Iohexol Hives 12/31/2008   Morphine Other (See Comments)     Vitals: BP (!) 162/94 (BP Location: Right Arm, Patient Position: Sitting)   Pulse 71   Ht 5' 7" (1.702 m)   Wt 147 lb (66.7 kg)   BMI 23.02 kg/m  Last Weight:  Wt Readings from Last 1 Encounters:  01/11/21 147 lb (66.7 kg)   Last Height:   Ht Readings from Last 1 Encounters:  01/11/21 5' 7" (1.702 m)  Physical exam: Exam: Gen: NAD, conversant, well nourised, well groomed                     CV: RRR, no MRG. No Carotid Bruits. No peripheral edema, warm, nontender Eyes: Conjunctivae clear without exudates or hemorrhage  Neuro: Detailed Neurologic Exam  Speech:    Speech is normal; fluent and spontaneous with normal comprehension.  Cognition:    The patient is oriented to person, place, and time;     recent and remote memory intact;     language fluent;     normal attention, concentration,     fund of knowledge Cranial Nerves:    The pupils are equal, round, and reactive to light. pupils too small to visualize fundi. Visual fields are full to finger confrontation. Extraocular movements are intact. Trigeminal sensation is intact and the muscles of mastication are normal. The face is symmetric. The palate elevates in the midline. Hearing intact. Voice is normal. Shoulder shrug is normal. The tongue has normal motion  without fasciculations.   Coordination:    No dysmetria or ataxia  Gait:uses a cane . Right leg external rotation. Slightly stooped. Not shuffling but narrow gait.       Motor Observation:    No asymmetry, no atrophy, and no involuntary movements noted. Tone:    Normal muscle tone.    Posture:    Posture is normal. normal erect    Strength:    Strength is equal and symmetric in all 4 limbs no focal difficulties.       Sensation: intact to LT     Reflex Exam:  DTR's:    Deep tendon reflexes in the upper and lower extremities are brisk  bilaterally.   Toes:    The toes are downgoing bilaterally.   Clonus:    Clonus is absent.     Assessment/Plan:  79-year-old patient with likely alzheimers but given her changes in personality, irritability, apathy, need to check for FTD. She cannot tolerate formal neurocog testing.  - FDG PET Scan for dementia alzheimer's vs FTD   - Tiny RIGHT thalamic focus chronic hemorrhage, likely sequelae of hypertensive cerebrovascular disease. Monitor blood pressure   Snoring, memory loss, daytime somnolence, morning headache: sleep eval was negative   In the past ordered: Home physical therapy with nursing and a home safety study   In the past referred to Home health agency for nursing for medication management, PT for gait and safety for repeated falls and weakness. Here with [name] Newman. [name]'s number is [phone].   Continue.   Chronic headaches: Namenda may help, it is used in migraine and headache management. Could try in future  Orders Placed This Encounter  Procedures   NM PET Metabolic Brain   B12 and Folate Panel   Methylmalonic acid, serum   TSH   Vitamin B1   Homocysteine   RPR   Basic Metabolic Panel     , MD  [facility] [address] [place], [place] [address]  Phone [phone] Fax [phone]  

## 2021-01-11 NOTE — Patient Instructions (Signed)
Call FDG PET Scan for Dementia Blood work  Alzheimer's Disease Alzheimer's disease is a brain disease that affects memory, thinking, language, and behavior. People with Alzheimer's disease lose mental abilities, and [name] gets worse over time. Alzheimer's disease is a form of dementia. What are the causes? This condition develops when a protein called beta-amyloid forms deposits inthe brain. It is not known what causes these deposits to form. Alzheimer's disease may also be caused by a gene mutation that is inherited from one parent or both parents. A gene mutation is a harmful change in a gene.Not everyone who inherits the genetic mutation will get the disease. What increases the risk? You are more likely to develop this condition if you: Are older than age 26. Are female. Have any of these medical conditions: High blood pressure. Diabetes. Heart or blood vessel disease. Smoke. Have obesity. Have had a brain injury. Have had a stroke. Have a family history of dementia. What are the signs or symptoms? Symptoms of this condition may happen in three stages, which often overlap. Early stage In this stage, you may continue to be independent. You may still be able to drive, work, and be social. Symptoms in this stage include: Minor memory problems, such as forgetting a name, words, or what you did recently. Difficulty with: Paying attention. Communicating. Doing familiar tasks. Problem solving or doing calculations. Following instructions. Learning new things. Anxiety. Social withdrawal. Loss of motivation. Moderate stage In this stage, you will start to need care. Symptoms in this stage include: Difficulty with expressing thoughts. Memory loss that affects daily life. This can include forgetting: Recent events that have happened. If you have taken medicines or eaten. Familiar places. You may get lost while walking or driving. To pay bills or manage finances. Personal  hygiene such as bathing or using the bathroom. Confusion about where you are or what time it is. Difficulty in judging distance. Changes in personality, mood, and behavior. You may be moody, irritable, angry, frustrated, fearful, anxious, or suspicious. Poor reasoning and judgment. Delusions or hallucinations. Changes in sleep patterns. Severe stage In the final stage, you will need help with your personal care and daily activities. Symptoms in this stage include: Worsening memory loss. Personality changes. Loss of awareness of your surroundings. Changes in physical abilities, including the ability to walk, sit, and swallow. Difficulty in communicating. Inability to control your bladder and bowels. Increasing confusion. Increasing behavior changes. How is this diagnosed?  This condition is diagnosed by a health care provider who specializes in diseases of the nervous system (neurologist) or one who specializes in care of the elderly (geriatrician or geriatric psychiatrist). Other causes of dementia may also be ruled out. Your health care provider will talk with you and your family, friends, or caregivers about your historyand symptoms. A thorough medical history will be taken, and you will have a physical exam and tests. Tests may include: Lab tests, such as blood or urine tests. Imaging tests, such as a CT scan, a PET scan, or an MRI. A lumbar puncture. This test involves removing and testing a small amount of the fluid that surrounds the brain and spinal cord. An electroencephalogram (EEG). In this test, small metal discs are used to measure electrical activity in the brain. Memory tests, cognitive tests, and neuropsychological tests. These tests evaluate brain function. Genetic testing. This may be done if you have early onset of the disease (before age 42) or if other family members have the disease. How is this treated?  At this time, there is no treatment to cure Alzheimer's disease  or stop it from getting worse. The goals of treatment are: To manage behavioral changes. To provide you with a safe environment. To help manage daily life for you and your caregivers. The following treatment options are available: Medicines. Medicines may help the memory work better and manage behavioral symptoms. Cognitive therapy. Cognitive therapy provides you with education, support, and memory aids. It is most helpful in the early stages of the condition. Counseling or spiritual guidance. It is normal to have a lot of feelings, including anger, relief, fear, and isolation. Counseling and guidance can help you deal with these feelings. Caregiving. This involves having caregivers help you with your daily activities. Family support groups. These provide education, emotional support, and information about community resources to family members who are taking care of you. Follow these instructions at home:  Medicines Take over-the-counter and prescription medicines only as told by your health care provider. Use a pill organizer or pill reminder to help you manage your medicines. Avoid taking medicines that can affect thinking, such as pain medicines or sleeping medicines. Lifestyle Make healthy lifestyle choices: Be physically active as told by your health care provider. Regular exercise may help improve symptoms. Do not use any products that contain nicotine or tobacco, such as cigarettes, e-cigarettes, and chewing tobacco. If you need help quitting, ask your health care provider. Do not drink alcohol. Eat a healthy diet. Practice stress-management techniques when you get stressed. Stay social. Drink enough fluid to keep your urine pale yellow. Make sure to get quality sleep. Avoid taking long naps during the day. Take short naps of 30 minutes or less if needed. Keep your sleeping area dark and cool. Avoid exercising during the few hours before you go to bed. Avoid caffeine products in  the afternoon and evening. General instructions Work with your health care provider to determine what you need help with and what your safety needs are. If you were given a bracelet that identifies you as a person with memory loss or tracks your location, make sure to wear it at all times. Talk with your health care provider about whether it is safe for you to drive. Work with your family to make important decisions, such as advance directives, medical power of attorney, or a living will. Keep all follow-up visits. This is important. Where to find more information The Alzheimer's Association: Call the 24-hour helpline at 1-681-320-2782, or visit [employer] Contact a health care provider if: You have nausea, vomiting, or trouble with eating related to a medicine. You have worsening mood or behavior changes, such as depression, anxiety, or hallucinations. You or your family members become concerned for your safety. Get help right away if: You become less responsive or are difficult to wake up. Your memory suddenly gets worse. You feel that you want to harm yourself. If you ever feel like you may hurt yourself or others, or have thoughts about taking your own life, get help right away. Go to your nearest emergency department or: Call your local emergency services (911 in the [place] Call a suicide crisis helpline, such as the [place] at (682)683-6551. This is open 24 hours a day in the [place] Text the Crisis Text Line at (514) 375-7412 (in the [place] Summary Alzheimer's disease is a brain disease that affects memory, thinking, language, and behavior. Alzheimer's disease is a form of dementia. This condition is diagnosed by a specialist in diseases of the nervous  system (neurologist) or one who specializes in care of the elderly. At this time, there is no treatment to cure Alzheimer's disease or stop it from getting worse. The goal of treatment is to help you manage any  symptoms. Work with your family to make important decisions, such as advance directives, medical power of attorney, or a living will. This information is not intended to replace advice given to you by your health care provider. Make sure you discuss any questions you have with your healthcare provider. Document Revised: 09/08/2019 Document Reviewed: 09/08/2019 Elsevier Patient Education  2022 [employer].

## 2021-01-12 ENCOUNTER — Encounter: Payer: Self-pay | Admitting: Neurology

## 2021-01-12 ENCOUNTER — Telehealth: Payer: Self-pay

## 2021-01-12 DIAGNOSIS — F03918 Unspecified dementia, unspecified severity, with other behavioral disturbance: Secondary | ICD-10-CM | POA: Insufficient documentation

## 2021-01-12 DIAGNOSIS — F0391 Unspecified dementia with behavioral disturbance: Secondary | ICD-10-CM | POA: Insufficient documentation

## 2021-01-12 DIAGNOSIS — F03B2 Unspecified dementia, moderate, with psychotic disturbance: Secondary | ICD-10-CM | POA: Insufficient documentation

## 2021-01-12 NOTE — Telephone Encounter (Signed)
Able to reach pt's granddaughter, [name] (DPR approved) regarding [name]'s recent ECHO Dr. Rockey Situ had a chance to review her results and advised   "Echo  Low normal cardiac function  Moderate hypertrophy  We need to make sure blood pressure stays well controlled"  [name] reports seen results on MyChart, no concerns at this time, pt has OV w/PCP this week, will print results off MyChart and have PCP review. Monitoring [name]'s BP at home, no concerns at this time, pt continues her daily cardiac meds.  Oherwise all questions or concerns were address and no additional concerns at this time.

## 2021-01-13 ENCOUNTER — Telehealth: Payer: Self-pay | Admitting: Neurology

## 2021-01-13 NOTE — Telephone Encounter (Signed)
[employer] medicare pending faxed notes  

## 2021-01-16 LAB — BASIC METABOLIC PANEL
BUN/Creatinine Ratio: 12 (ref 12–28)
BUN: 13 mg/dL (ref 8–27)
CO2: 26 mmol/L (ref 20–29)
Calcium: 10 mg/dL (ref 8.7–10.3)
Chloride: 105 mmol/L (ref 96–106)
Creatinine, Ser: 1.05 mg/dL — ABNORMAL HIGH (ref 0.57–1.00)
Glucose: 91 mg/dL (ref 65–99)
Potassium: 4 mmol/L (ref 3.5–5.2)
Sodium: 145 mmol/L — ABNORMAL HIGH (ref 134–144)
eGFR: 54 mL/min/{1.73_m2} — ABNORMAL LOW (ref >59)

## 2021-01-16 LAB — VITAMIN B1: Thiamine: 139.3 nmol/L (ref 66.5–200.0)

## 2021-01-16 LAB — RPR: RPR Ser Ql: NONREACTIVE

## 2021-01-16 LAB — METHYLMALONIC ACID, SERUM: Methylmalonic Acid: 222 nmol/L (ref 0–378)

## 2021-01-16 LAB — B12 AND FOLATE PANEL
Folate: 16.8 ng/mL (ref >3.0)
Vitamin B-12: 408 pg/mL (ref 232–1245)

## 2021-01-16 LAB — TSH: TSH: 1.21 u[IU]/mL (ref 0.450–4.500)

## 2021-01-16 LAB — HOMOCYSTEINE: Homocysteine: 17.6 umol/L (ref 0.0–19.2)

## 2021-01-17 NOTE — Telephone Encounter (Signed)
[employer] medicare Josem KaufmannIU:[id number] (exp. 01/14/21 to 02/28/21). Sent a message to [place] with [facility] to reach out to the patient to scheduled at [facility].

## 2021-02-09 ENCOUNTER — Ambulatory Visit: Payer: Self-pay

## 2021-02-09 ENCOUNTER — Encounter: Payer: Self-pay | Admitting: Emergency Medicine

## 2021-02-09 ENCOUNTER — Ambulatory Visit (INDEPENDENT_AMBULATORY_CARE_PROVIDER_SITE_OTHER): Payer: Medicare Other

## 2021-02-09 ENCOUNTER — Other Ambulatory Visit: Payer: Self-pay

## 2021-02-09 ENCOUNTER — Ambulatory Visit
Admission: EM | Admit: 2021-02-09 | Discharge: 2021-02-09 | Disposition: A | Discharge status: Home / Self Care | Payer: Medicare Other | Attending: Emergency Medicine | Admitting: Emergency Medicine

## 2021-02-09 DIAGNOSIS — R509 Fever, unspecified: Secondary | ICD-10-CM

## 2021-02-09 DIAGNOSIS — R059 Cough, unspecified: Secondary | ICD-10-CM | POA: Diagnosis not present

## 2021-02-09 DIAGNOSIS — U071 COVID-19: Secondary | ICD-10-CM

## 2021-02-09 DIAGNOSIS — Z1152 Encounter for screening for COVID-19: Secondary | ICD-10-CM

## 2021-02-09 MED ORDER — ACETAMINOPHEN 325 MG PO TABS
650.0000 mg | ORAL_TABLET | Freq: Once | ORAL | Status: AC
  Administered 2021-02-09: 650 mg via ORAL

## 2021-02-09 MED ORDER — BENZONATATE 200 MG PO CAPS: 200.0000 mg | ORAL_CAPSULE | Freq: Three times a day (TID) | ORAL | 0 refills | Status: DC | PRN

## 2021-02-09 NOTE — ED Provider Notes (Addendum)
HPI  SUBJECTIVE:  [name] is a 80 y.o. female who presents with Cough, dizziness /unsteadiness starting last night.  Patient is normally a "little wobbly on her feet", but it has gotten worse since yesterday.  She reports body aches, headaches, nasal congestion, rhinorrhea, postnasal drip.  Granddaughter states that the patient was hallucinating last night.  She was exposed to COVID 4 to 5 days ago.  She got the COVID booster.  No fevers, wheezing, shortness of breath, nausea, vomiting, change in her baseline diarrhea, abdominal pain, sore throat,  loss of sense of smell or taste.  No chest pain, palpitations.   No antibiotics in the past month.  No antipyretic in the past 6 hours.  No aggravating or alleviating factors.  Patient has not tried anything for her symptoms. She has a past medical history of dementia, hypertension, colon cancer, breast cancer, chronic diarrhea, small bowel obstruction status postresection.  PMD: [facility].  Patient is a challenging historian due to memory issues. .Additional history obtained from granddaughter.  Past Medical History:  Diagnosis Date   Anxiety state, unspecified    Arthropathy, unspecified, site unspecified    Benign carcinoid tumor of the duodenum    Blindness of one eye    Breast cancer ([place]) 2009   Cancer [facility])    breast   Colon cancer ([place])    Depressive disorder, not elsewhere classified    Diarrhea    Disease of gallbladder    Esophageal cancer ([facility])    GERD (gastroesophageal reflux disease)    Headache(784.0)    Hyperlipidemia    Hypertension    Kidney stones    OA (osteoarthritis) of knee    right   Personal history of radiation therapy    Syncope and collapse    Unspecified ectopic pregnancy without intrauterine pregnancy     Past Surgical History:  Procedure Laterality Date   BREAST BIOPSY Bilateral    BREAST EXCISIONAL BIOPSY Right 2009   (+)   BREAST SURGERY Right    Cataract surgery Left 10/11/2017    Implant   CHOLECYSTECTOMY  05-27-12   COLON RESECTION     COLONOSCOPY     PARTIAL HYSTERECTOMY     TOTAL KNEE ARTHROPLASTY Right     Family History  Problem Relation Age of Onset   Cancer Mother        unknown type   Cancer Father    Heart attack Brother    Cancer Paternal Grandmother    Diabetes Daughter    Breast cancer Neg Hx     Social History   Tobacco Use   Smoking status: Never   Smokeless tobacco: Never  Vaping Use   Vaping Use: Never used  Substance Use Topics   Alcohol use: No   Drug use: No    No current facility-administered medications for this encounter.  Current Outpatient Medications:    benzonatate (TESSALON) 200 MG capsule, Take 1 capsule (200 mg total) by mouth 3 (three) times daily as needed for cough., Disp: 30 capsule, Rfl: 0   alendronate (FOSAMAX) 70 MG tablet, alendronate 70 mg tablet  TAKE 1 TABLET BY MOUTH ONCE A WEEK, Disp: , Rfl:    amLODipine (NORVASC) 10 MG tablet, Take 10 mg by mouth daily., Disp: , Rfl:    aspirin 81 MG EC tablet, Take 81 mg by mouth daily. , Disp: , Rfl:    brimonidine (ALPHAGAN) 0.2 % ophthalmic solution, brimonidine 0.2 % eye drops, Disp: , Rfl:  dorzolamide (TRUSOPT) 2 % ophthalmic solution, Place 1 drop into both eyes 2 (two) times daily., Disp: , Rfl:    famotidine (PEPCID) 20 MG tablet, Take 1 tablet (20 mg total) by mouth daily., Disp: 30 tablet, Rfl: 0   latanoprost (XALATAN) 0.005 % ophthalmic solution, Place 1 drop into both eyes at bedtime., Disp: , Rfl:    omeprazole (PRILOSEC) 40 MG capsule, Take 1 capsule (40 mg total) by mouth in the morning and at bedtime., Disp: 60 capsule, Rfl: 0   sertraline (ZOLOFT) 100 MG tablet, Take 150 mg by mouth daily., Disp: , Rfl:    timolol (TIMOPTIC) 0.5 % ophthalmic solution, timolol maleate 0.5 % eye drops, Disp: , Rfl:   Allergies  Allergen Reactions   Iodinated Diagnostic Agents Hives   Iohexol Hives     Code: HIVES, Desc: PTS GRANDDAUGHTER [name] CALLED 12/31/08  TO NOTIFY us THAT HER GRANDMOTHER HAD A REACTION TO THE IV DYE 8 HRS POST CT EXAM PT DEVELOPED HIVES/RASH.12/31/08/RM, Onset Date: [id number]    Morphine Other (See Comments)    confusion     ROS  As noted in HPI.   Physical Exam  BP 128/77   Pulse 72   Temp (!) 100.5 F (38.1 C) (Oral)   Resp 20   SpO2 94%   Constitutional: Well developed, well nourished, no acute distress Eyes: PERRL, EOMI, conjunctiva normal bilaterally HENT: Normocephalic, atraumatic,mucus membranes moist.  TMs normal bilaterally.  Mild nasal congestion.  No maxillary, frontal sinus tenderness.  Unable to completely visualize oropharynx due to patient cooperation. Neck: No cervical lymphadenopathy Respiratory: Clear to auscultation bilaterally, no rales, no wheezing, no rhonchi Cardiovascular: Normal rate and rhythm, no murmurs, no gallops, no rubs GI: Soft, nondistended, normal bowel sounds, nontender, no rebound, no guarding skin: No rash, skin intact Musculoskeletal: no deformities Neurologic: Alert & oriented x 3, CN III-XII grossly intact, no motor deficits, sensation grossly intact.  Ambulatory with a cane.  Answers questions appropriately. Psychiatric: Speech and behavior appropriate   ED Course   Medications  acetaminophen (TYLENOL) tablet 650 mg (650 mg Oral Given 02/09/21 1852)    Orders Placed This Encounter  Procedures   Novel Coronavirus, NAA ([facility])    Standing Status:   Standing    Number of Occurrences:   1    Order Specific Question:   Patient immune status    Answer:   Normal    Order Specific Question:   Release to patient    Answer:   Immediate   SARS-COV-2, NAA 2 DAY TAT    Standing Status:   Standing    Number of Occurrences:   1   DG Chest 2 View    Standing Status:   Standing    Number of Occurrences:   1    Order Specific Question:   Reason for Exam (SYMPTOM  OR DIAGNOSIS REQUIRED)    Answer:   cough, fever, r/o PNA vs COVID PNA   No results found for this or any  previous visit (from the past 24 hour(s)). DG Chest 2 View  Result Date: 02/09/2021 CLINICAL DATA:  Fever EXAM: CHEST - 2 VIEW COMPARISON:  10/21/2020 FINDINGS: Atelectasis or scarring left base. No acute consolidation or effusion. Enlarged cardiomediastinal silhouette with tortuous aorta and mild aneurysmal dilatation of the arch. Moderate hiatal hernia. IMPRESSION: No active cardiopulmonary disease. Hiatal hernia. Mild cardiomegaly Electronically Signed   By: Donavan Foil M.D.   On: 02/09/2021 19:46    Results for orders placed  or performed during the hospital encounter of 02/09/21  Novel Coronavirus, NAA ([facility])   Specimen: Nasopharyngeal Swab; Nasopharyngeal(NP) swabs in vial transport medium   Nasopharynge  Patient  Result Value Ref Range   SARS-CoV-2, NAA Detected (A) Not Detected  SARS-COV-2, NAA 2 DAY TAT   Nasopharynge  Patient  Result Value Ref Range   SARS-CoV-2, NAA 2 DAY TAT Performed     ED Clinical Impression  1. COVID-19 virus infection   2. Encounter for screening for COVID-19   3. Cough   4. Fever, unspecified fever cause      ED Assessment/Plan  Concern for COVID.  Patient had a fever, was given Tylenol here.  Checking chest x-ray, COVID.  Reviewed imaging independently.  Moderate hiatal hernia.  Atelectasis or scarring left base.  No consolidation, infiltrate.  See radiology report for full details.  GFR 54 from labs on 01/11/2021.  Will wait for COVID test prior to starting on antivirals.  Will start on Paxlovid renal dose if positive.  Discussed this with patient and granddaughter.  Call granddaughter Campbell Riches at [phone] with a positive COVID result.  Patient appears well, no respiratory distress.  Plan to send home with Tessalon, Tylenol 1000 mg 3-4 times a day as needed for body aches, headaches.  Saline nasal irrigation for the nasal congestion, rhinorrhea.  Advised patient to get a pulse oximeter.  Advised granddaughter to keep a very close eye on  the patient' and to have a low threshold to go to the emergency department.  She agrees with plan.  COVID pending at the time of signing of this note.  Discussed labs, imaging, MDM, treatment plan, and plan for follow-up with patient and family. Discussed sn/sx that should prompt return to the ED. family agrees with plan.   9/90 0711-addendum.  COVID positive.  There are 3 drug interactions that would require medication adjustments and EKG monitoring with a Paxlovid.  I am uncomfortable doing this.  Will prescribe Molnupiravir instead of Paxlovid. will have staff contact granddaughter Campbell Riches notifying them of prescription.  Meds ordered this encounter  Medications   acetaminophen (TYLENOL) tablet 650 mg   benzonatate (TESSALON) 200 MG capsule    Sig: Take 1 capsule (200 mg total) by mouth 3 (three) times daily as needed for cough.    Dispense:  30 capsule    Refill:  0      *This clinic note was created using [employer]. Therefore, there may be occasional mistakes despite careful proofreading. ?    Melynda Ripple, MD 02/10/21 [id number]    Melynda Ripple, MD 02/11/21 [phone]

## 2021-02-09 NOTE — Discharge Instructions (Addendum)
Chest x-ray was negative for pneumonia.  Tessalon for the cough, saline nasal irrigation with a NeilMed sinus rinse and distilled water as often as you want for the nasal congestion.  May give Tylenol 500 to 1000 mg 3-4 times a day as needed for body aches, headaches, fevers.  Get a pulse oximeter and keep an eye on oxygen saturation.  Go to the ER if it gets consistently below 90%.  Go to the ER if for altered mental status, difficulty breathing, or any other concerns.  We will contact you and call in an antiviral if her COVID is positive.

## 2021-02-09 NOTE — ED Triage Notes (Signed)
Pt here after being exposed to covid recently and having a cough, dizziness and fever since last night. Temp here is 100.5.

## 2021-02-10 LAB — NOVEL CORONAVIRUS, NAA: SARS-CoV-2, NAA: DETECTED — AB

## 2021-02-10 LAB — SARS-COV-2, NAA 2 DAY TAT

## 2021-02-11 ENCOUNTER — Telehealth (HOSPITAL_COMMUNITY): Payer: Self-pay | Admitting: Emergency Medicine

## 2021-02-11 DIAGNOSIS — U071 COVID-19: Secondary | ICD-10-CM

## 2021-02-11 MED ORDER — MOLNUPIRAVIR EUA 200MG CAPSULE: 4.0000 | ORAL_CAPSULE | Freq: Two times a day (BID) | ORAL | 0 refills | Status: AC

## 2021-02-11 NOTE — Telephone Encounter (Signed)
Patient COVID-positive.  GFR 54. .  Several drug interactions with Paxlovid that require medication adjustments and/or EKG monitoring.  I am uncomfortable doing this.  Will prescribe Molnupiravir to pharmacy on record.  We will have staff contact granddaughter Campbell Riches at [phone] notifying her of the prescription.  Patient is already aware of the result.

## 2021-05-04 ENCOUNTER — Ambulatory Visit: Payer: Medicare Other | Admitting: Neurology

## 2021-05-04 ENCOUNTER — Telehealth: Payer: Self-pay | Admitting: Neurology

## 2021-05-04 ENCOUNTER — Encounter: Payer: Self-pay | Admitting: Neurology

## 2021-05-04 VITALS — BP 120/77 | HR 74 | Ht 66.0 in | Wt 140.8 lb

## 2021-05-04 DIAGNOSIS — F03B2 Unspecified dementia, moderate, with psychotic disturbance: Secondary | ICD-10-CM

## 2021-05-04 MED ORDER — DONEPEZIL HCL 5 MG PO TABS: 5.0000 mg | ORAL_TABLET | Freq: Every day | ORAL | 10 refills | Status: DC

## 2021-05-04 NOTE — Telephone Encounter (Signed)
[employer] medicare pending faxed notes  

## 2021-05-04 NOTE — Patient Instructions (Addendum)
FDG PET Scan for dementia alzheimer's vs FTD: was not completed FDG PET SCan approved again - Start Aricept/donepezil. In about 2 weeks if no side effects, mychart me and we can increase it to 10mg  -The second medication I would start is Namenda/memantine if she does well on the higher dose of Aricept - [facility] brain study for granddaughter -[facility] Brain for granddaughter -If the scan is not conclusive, we may consider formal memory testing  - In the past referred to Home health agency for nursing for medication management, PT for gait and safety for repeated falls and weakness. Here with Campbell Riches. [name]'s number is [phone] Continue. - Chronic headaches: Namenda may help, it is used in migraine and dementia management. Could try in future  Alzheimer's Disease Alzheimer's disease is a brain disease that affects memory, thinking, language, and behavior. People with Alzheimer's disease lose mental abilities, and the disease gets worse over time. Alzheimer's disease is a form of dementia. What are the causes? This condition develops when a protein called beta-amyloid forms deposits in the brain. It is not known what causes these deposits to form. Alzheimer's disease may also be caused by a gene mutation that is inherited from one parent or both parents. A gene mutation is a harmful change in a gene. Not everyone who inherits the genetic mutation will get the disease. What increases the risk? You are more likely to develop this condition if you: Are older than age 46. Are female. Have any of these medical conditions: High blood pressure. Diabetes. Heart or blood vessel disease. Smoke. Have obesity. Have had a brain injury. Have had a stroke. Have a family history of dementia. What are the signs or symptoms? Symptoms of this condition may happen in three stages, which often overlap. Early stage In this stage, you may continue to be independent. You may still be able to drive,  work, and be social. Symptoms in this stage include: Minor memory problems, such as forgetting a name, words, or what you did recently. Difficulty with: Paying attention. Communicating. Doing familiar tasks. Problem solving or doing calculations. Following instructions. Learning new things. Anxiety. Social withdrawal. Loss of motivation. Moderate stage In this stage, you will start to need care. Symptoms in this stage include: Difficulty with expressing thoughts. Memory loss that affects daily life. This can include forgetting: Recent events that have happened. If you have taken medicines or eaten. Familiar places. You may get lost while walking or driving. To pay bills or manage finances. Personal hygiene such as bathing or using the bathroom. Confusion about where you are or what time it is. Difficulty in judging distance. Changes in personality, mood, and behavior. You may be moody, irritable, angry, frustrated, fearful, anxious, or suspicious. Poor reasoning and judgment. Delusions or hallucinations. Changes in sleep patterns. Severe stage In the final stage, you will need help with your personal care and daily activities. Symptoms in this stage include: Worsening memory loss. Personality changes. Loss of awareness of your surroundings. Changes in physical abilities, including the ability to walk, sit, and swallow. Difficulty in communicating. Inability to control your bladder and bowels. Increasing confusion. Increasing behavior changes. How is this diagnosed? This condition is diagnosed by a health care provider who specializes in diseases of the nervous system (neurologist) or one who specializes in care of the elderly (geriatrician or geriatric psychiatrist). Other causes of dementia may also be ruled out. Your health care provider will talk with you and your family, friends, or caregivers about  your history and symptoms. A thorough medical history will be taken, and  you will have a physical exam and tests. Tests may include: Lab tests, such as blood or urine tests. Imaging tests, such as a CT scan, a PET scan, or an MRI. A lumbar puncture. This test involves removing and testing a small amount of the fluid that surrounds the brain and spinal cord. An electroencephalogram (EEG). In this test, small metal discs are used to measure electrical activity in the brain. Memory tests, cognitive tests, and neuropsychological tests. These tests evaluate brain function. Genetic testing. This may be done if you have early onset of the disease (before age 35) or if other family members have the disease. How is this treated? At this time, there is no treatment to cure Alzheimer's disease or stop it from getting worse. The goals of treatment are: To manage behavioral changes. To provide you with a safe environment. To help manage daily life for you and your caregivers. The following treatment options are available: Medicines. Medicines may help the memory work better and manage behavioral symptoms. Cognitive therapy. Cognitive therapy provides you with education, support, and memory aids. It is most helpful in the early stages of the condition. Counseling or spiritual guidance. It is normal to have a lot of feelings, including anger, relief, fear, and isolation. Counseling and guidance can help you deal with these feelings. Caregiving. This involves having caregivers help you with your daily activities. Family support groups. These provide education, emotional support, and information about community resources to family members who are taking care of you. Follow these instructions at home: Medicines Take over-the-counter and prescription medicines only as told by your health care provider. Use a pill organizer or pill reminder to help you manage your medicines. Avoid taking medicines that can affect thinking, such as pain medicines or sleeping medicines. Lifestyle Make  healthy lifestyle choices: Be physically active as told by your health care provider. Regular exercise may help improve symptoms. Do not use any products that contain nicotine or tobacco, such as cigarettes, e-cigarettes, and chewing tobacco. If you need help quitting, ask your health care provider. Do not drink alcohol. Eat a healthy diet. Practice stress-management techniques when you get stressed. Stay social. Drink enough fluid to keep your urine pale yellow. Make sure to get quality sleep. Avoid taking long naps during the day. Take short naps of 30 minutes or less if needed. Keep your sleeping area dark and cool. Avoid exercising during the few hours before you go to bed. Avoid caffeine products in the afternoon and evening. General instructions Work with your health care provider to determine what you need help with and what your safety needs are. If you were given a bracelet that identifies you as a person with memory loss or tracks your location, make sure to wear it at all times. Talk with your health care provider about whether it is safe for you to drive. Work with your family to make important decisions, such as advance directives, medical power of attorney, or a living will. Keep all follow-up visits. This is important. Where to find more information The Alzheimer's Association: Call the 24-hour helpline at 1-(701)503-2247, or visit [employer] Contact a health care provider if: You have nausea, vomiting, or trouble with eating related to a medicine. You have worsening mood or behavior changes, such as depression, anxiety, or hallucinations. You or your family members become concerned for your safety. Get help right away if: You become less responsive or  are difficult to wake up. Your memory suddenly gets worse. You feel that you want to harm yourself. If you ever feel like you may hurt yourself or others, or have thoughts about taking your own life, get help right away. Go  to your nearest emergency department or: Call your local emergency services (911 in the [place] Call a suicide crisis helpline, such as the [place] at 704-108-5393 or 988 in the [place]. This is open 24 hours a day in the [place] Text the Crisis Text Line at (734)643-3632 (in the [place].). Summary Alzheimer's disease is a brain disease that affects memory, thinking, language, and behavior. Alzheimer's disease is a form of dementia. This condition is diagnosed by a specialist in diseases of the nervous system (neurologist) or one who specializes in care of the elderly. At this time, there is no treatment to cure Alzheimer's disease or stop it from getting worse. The goal of treatment is to help you manage any symptoms. Work with your family to make important decisions, such as advance directives, medical power of attorney, or a living will. This information is not intended to replace advice given to you by your health care provider. Make sure you discuss any questions you have with your health care provider. Document Revised: 12/15/2020 Document Reviewed: 09/08/2019 Elsevier Patient Education  [place].   Donepezil Tablets What is this medication? DONEPEZIL (doe NEP e zil) treats memory loss and confusion (dementia) in people who have Alzheimer disease. It works by improving attention, memory, and the ability to engage in daily activities. It is not a cure for dementia or Alzheimer disease. This medicine may be used for other purposes; ask your health care provider or pharmacist if you have questions. COMMON BRAND NAME(S): Aricept What should I tell my care team before I take this medication? They need to know if you have any of these conditions: Asthma or other lung disease Difficulty passing urine Head injury Heart disease History of irregular heartbeat Liver disease Seizures (convulsions) Stomach or intestinal disease, ulcers or stomach bleeding An unusual or  allergic reaction to donepezil, other medications, foods, dyes, or preservatives Pregnant or trying to get pregnant Breast-feeding How should I use this medication? Take this medication by mouth with a glass of water. Follow the directions on the prescription label. You may take this medication with or without food. Take this medication at regular intervals. This medication is usually taken before bedtime. Do not take it more often than directed. Continue to take your medication even if you feel better. Do not stop taking except on your care team's advice. If you are taking the 23 mg donepezil tablet, swallow it whole; do not cut, crush, or chew it. Talk to your care team about the use of this medication in children. Special care may be needed. Overdosage: If you think you have taken too much of this medicine contact a poison control center or emergency room at once. NOTE: This medicine is only for you. Do not share this medicine with others. What if I miss a dose? If you miss a dose, take it as soon as you can. If it is almost time for your next dose, take only that dose, do not take double or extra doses. What may interact with this medication? Do not take this medication with any of the following: Certain medications for fungal infections like itraconazole, fluconazole, posaconazole, and voriconazole Cisapride Dextromethorphan; quinidine Dronedarone Pimozide Quinidine Thioridazine This medication may also interact with the  following: Antihistamines for allergy, cough and cold Atropine Bethanechol Carbamazepine Certain medications for bladder problems like oxybutynin, tolterodine Certain medications for Parkinson's disease like benztropine, trihexyphenidyl Certain medications for stomach problems like dicyclomine, hyoscyamine Certain medications for travel sickness like scopolamine Dexamethasone Dofetilide Ipratropium NSAIDs, medications for pain and inflammation, like ibuprofen or  naproxen Other medications for Alzheimer's disease Other medications that prolong the QT interval (cause an abnormal heart rhythm) Phenobarbital Phenytoin Rifampin, rifabutin or rifapentine Ziprasidone This list may not describe all possible interactions. Give your health care provider a list of all the medicines, herbs, non-prescription drugs, or dietary supplements you use. Also tell them if you smoke, drink alcohol, or use illegal drugs. Some items may interact with your medicine. What should I watch for while using this medication? Visit your care team for regular checks on your progress. Check with your care team if your symptoms do not get better or if they get worse. You may get drowsy or dizzy. Do not drive, use machinery, or do anything that needs mental alertness until you know how this medication affects you. What side effects may I notice from receiving this medication? Side effects that you should report to your care team as soon as possible: Allergic reactions--skin rash, itching, hives, swelling of the face, lips, tongue, or throat Peptic ulcer--burning stomach pain, loss of appetite, bloating, burping, heartburn, nausea, vomiting Seizures Slow heartbeat--dizziness, feeling faint or lightheaded, confusion, trouble breathing, unusual weakness or fatigue Stomach bleeding--bloody or black, tar-like stools, vomiting blood or brown material that looks like coffee grounds Trouble passing urine Side effects that usually do not require medical attention (report these to your care team if they continue or are bothersome): Diarrhea Fatigue Loss of appetite Muscle pain or cramps Nausea Trouble sleeping This list may not describe all possible side effects. Call your doctor for medical advice about side effects. You may report side effects to FDA at 1-800-FDA-1088. Where should I keep my medication? Keep out of reach of children. Store at room temperature between 15 and 30 degrees C  (59 and 86 degrees F). Throw away any unused medication after the expiration date. NOTE: This sheet is a summary. It may not cover all possible information. If you have questions about this medicine, talk to your doctor, pharmacist, or health care provider.  2022 Elsevier/Gold Standard (2021-01-05 00:00:00)

## 2021-05-04 NOTE — Progress Notes (Addendum)
[facility]    Provider:  Dr Jaynee Eagles Referring Provider: Denton Lank, MD Primary Care Physician:  Denton Lank, MD  CC:  Memory loss  Addendum 06/19/2021: FDG Brain Scan did not show alzheimer's or frontotemporal. 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 We discussed with granddaughter and said she would let us know if she wanted to go forward. We discussed with her, she has not given [place] the go ahead so we are at a standstill on whether to go forward. Just Fyi to her care team in cardiology and I did forward this to her primary care as well.    Her memory is getting worse. She feels the TV is talking to her, delusions/hallucinations, family is here, her MMSE is 19/30 and shows a steady decline, we ordered the FDG PEt Scan but she did not get It completed so we will get it re-approved and send to memory testing to see if this can give [place] an answer, likely alzheimers but given her other symptoms like hallucinations/delusions, personality changes, needs to rule out FTD. MRI of the brain in 2017 was largely normal for age so will not repeat at this time.    HPI: memory loss.  We seen patient in the past for memory loss but its been over 3 years.  She has a past medical history of traumatic brain injury, hypertension, headaches, Bradycardia, syncope and collapse, daytime somnolence, memory loss, snoring, hyperlipidemia.  In the past we started her on Aricept and she could not tolerate it.  MMSE is in the past and fluctuated between 20-25/30.  Patient had a sleep evaluation in the past without significant findings.  I reviewed notes, when we first saw her in 2017 she was here with her granddaughter and daughter who stated she repeated herself which it started 2 years prior, may be brother in his 47s with memory loss but no history of dementia, it was more short-term memory forgetting things like names and  faces.  She was not driving at the time.  She was needing help for bills.  MRI of the brain in the past showed mild atrophy and chronic microvascular ischemic changes with tiny right thalamic focus of chronic hemorrhage and she was advised to monitor blood pressure.  We sent her to PT for gait and safety.  I also reviewed notes from [facility], recent labs in March of this year showed a BUN of 14, creatinine 1.24, EGFR 44, vitamin D very low at 21, B12 368, repeat MRI in the past September 2018 was normal for age.  Here with her granddaughter, memory is worsening, granddaughter provides muchinformation, she forgets conversations, patient states she doesn't forget things at home or any accidents in the home, they don;t let her use the stove anymore per granddaughter they unplug the stove, family is there often, more short-term memory loss, progressive, ongoing for at least 5 years, she continues to forget things, she is more angry all the time, less social, personality changes, irritable, not driving. She refuses social, won;t go to to an adult daycare, family pays the bills, unclear family history of dementia but brother had memory issues and didn;t know who patient was.    MRI brain 2018: reviewed images and agree: IMPRESSION: 1.  No metastatic disease or acute intracranial abnormality. 2. Stable since 2017 and largely unremarkable for age MRI appearance of the brain  Interval history  10/05/2016: She still has headaches sometimes. She have headaches at least 3x a week.  Here with her granddaughter Aniceto Boss. She could not tolerate the Aricept. She refuses anymore medications. Could benefit from continued PT. Will send [facility] back. Wants [facility] same people that were there before.   Interval history 07/06/2016: Patient returns today. MMSE improved from 20/30 to 25/30. She wants to discuss a new problem headaches. She has had a few falls in her home. A toy was on the floor and she just  fell on the floor but didn;t hit anything. Sleep study was negative. She has headaches. She has "high pressures in my eyes" and that is being treated. Headaches are in the frontal areas. She has them every day. Ibuprofen helps. She takes it every once in a while. She has been complaining of the headaches and dizziness more frequently. Daughter feels something is different. They are going to see the eye doctor and her primary care she feels she is lightheaded.  She has headaches when she gets up and the headaches are there tea helps. She has always had headaches and sometimes they are worse. She has had headaches all her life. She used to have migraines.She gets agitated. She does drinka  Lot of fluids.    Ward Givens 03/2016: HISTORY OF PRESENT ILLNESS: Ms. Low is a 80 year old female with a history of confusional arousals, snoring and sleep disturbance. She returns today to discuss her ONO results. I explained to the patient that Dr. Brett Fairy has reviewed her ONO study and reports that it is normal. She does not qualify for supplemental oxygen at night. Patient verbalized understanding. At the last visit Dr. Brett Fairy encouraged the patient to use melatonin before bedtime. Her granddaughters with her and reports that they forgot to pick this up. They are willing to give this a try. She was also encouraged to participate in a senior citizen's group. She states that she's been going with a friend to a church group. At this group they do things such as watching movies and eating. Patient overall feels that she is doing okay. In the past she has been given Aricept however this caused vivid dreams. She is no longer on Aricept.     HPI 02/2016:  CATLYNN GRONDAHL is a 80 y.o. female here as a referral from Dr. Posey Pronto for memory loss. Past medical history of hypertension, high cholesterol, migraine, depression, anxiety, breast cancer and colon cancer. Here with daughter and granddaughter who provide most  information. She loses things. She repeats herself. Started 2 years ago and worsening. She had a brother in his 61s maybe with memory loss but also had a stroke, no FHx of Alzheimer's dementia. More short-term memory loss. She forgets things and later remembers it like names. She forgets names and faces. She has forgotten things that they discussed multiple times over the years and patient does not remember. A lot of little things. She lives alone. She does not drive. Patient pays the bills but needs help now. She needs help getting her medications. She does not want to eat. Her husband just dies a year ago and they were married 50 years ago. She has had migraine headaches for years and she has had chronic headaches for years since daughter remembers and now they feel like daily pressure hedaches. Granddaughter thinks she is depressed. Says that patient will repeat things even if she just told them earlier. She hides things and can;t find them. Headaches are in the morning when she  wakes up every day. She snores badly per family. Headaches are daily she wakes with them every morning.     Reviewed MRi brain images personally and with patient and family: Mild atrophy and chronic microvascular ischemic change. Tiny RIGHT thalamic focus chronic hemorrhage, likely sequelae of hypertensive cerebrovascular disease.   No acute intracranial findings are evident.     Reviewed notes, labs and imaging from outside physicians, which showed:    Labs drawn 12/30/2015: CMP was unremarkable with creatinine 0.91, B12 was 1037, folate 13.2.      Her primary care notes, she has been depressed, flat affect sometimes tearful, doesn't want to talk to counselor may be in the future. Patient has headache on and off. She suspected tension headache. Tried stretching increasing fluids for hydration, may take Tylenol, MRI was ordered.   MRI of the brain (personally reviewed images 01/22/2016) and agree with the following: Mild  atrophy and chronic microvascular ischemic change. Tiny RIGHT thalamic focus chronic hemorrhage, likely sequelae of hypertensive cerebrovascular disease.   No acute intracranial findings are evident. Review of Systems: Patient complains of symptoms per HPI as well as the following symptoms: irritability/hallucinations . Pertinent negatives and positives per HPI. All others negative     Social History   Socioeconomic History   Marital status: Widowed    Spouse name: Not on file   Number of children: Not on file   Years of education: Not on file   Highest education level: 6th grade  Occupational History   Occupation: Retired  Tobacco Use   Smoking status: Never   Smokeless tobacco: Never  Vaping Use   Vaping Use: Never used  Substance and Sexual Activity   Alcohol use: No   Drug use: No   Sexual activity: Not on file  Other Topics Concern   Not on file  Social History Narrative   Lives alone   Caffeine use: <1 cup coffee/day, 2-3 pepsi per day   Social Determinants of Health   Financial Resource Strain: Not on file  Food Insecurity: Not on file  Transportation Needs: Not on file  Physical Activity: Not on file  Stress: Not on file  Social Connections: Not on file  Intimate Partner Violence: Not on file    Family History  Problem Relation Age of Onset   Cancer Mother        unknown type   Cancer Father    Heart attack Brother    Cancer Paternal Grandmother    Diabetes Daughter    Breast cancer Neg Hx     Past Medical History:  Diagnosis Date   Anxiety state, unspecified    Arthropathy, unspecified, site unspecified    Benign carcinoid tumor of the duodenum    Blindness of one eye    Breast cancer ([place]) 2009   Cancer ([place])    breast   Colon cancer ([place])    Depressive disorder, not elsewhere classified    Diarrhea    Disease of gallbladder    Esophageal cancer ([place])    GERD (gastroesophageal reflux disease)    Headache(784.0)    Hyperlipidemia     Hypertension    Kidney stones    OA (osteoarthritis) of knee    right   Personal history of radiation therapy    Syncope and collapse    Unspecified ectopic pregnancy without intrauterine pregnancy     Past Surgical History:  Procedure Laterality Date   BREAST BIOPSY Bilateral    BREAST EXCISIONAL BIOPSY Right 2009   (+)  BREAST SURGERY Right    Cataract surgery Left 10/11/2017   Implant   CHOLECYSTECTOMY  05-27-12   COLON RESECTION     COLONOSCOPY     PARTIAL HYSTERECTOMY     TOTAL KNEE ARTHROPLASTY Right     Current Outpatient Medications  Medication Sig Dispense Refill   alendronate (FOSAMAX) 70 MG tablet alendronate 70 mg tablet  TAKE 1 TABLET BY MOUTH ONCE A WEEK     amLODipine (NORVASC) 10 MG tablet Take 10 mg by mouth daily.     aspirin 81 MG EC tablet Take 81 mg by mouth daily.      brimonidine (ALPHAGAN) 0.2 % ophthalmic solution brimonidine 0.2 % eye drops     donepezil (ARICEPT) 5 MG tablet Take 1 tablet (5 mg total) by mouth at bedtime. 30 tablet 10   dorzolamide (TRUSOPT) 2 % ophthalmic solution Place 1 drop into both eyes 2 (two) times daily.     latanoprost (XALATAN) 0.005 % ophthalmic solution Place 1 drop into both eyes at bedtime.     sertraline (ZOLOFT) 100 MG tablet Take 150 mg by mouth daily.     timolol (TIMOPTIC) 0.5 % ophthalmic solution timolol maleate 0.5 % eye drops     famotidine (PEPCID) 20 MG tablet Take 1 tablet (20 mg total) by mouth daily. 30 tablet 0   omeprazole (PRILOSEC) 40 MG capsule Take 1 capsule (40 mg total) by mouth in the morning and at bedtime. 60 capsule 0   No current facility-administered medications for this visit.    Allergies as of 05/04/2021 - Review Complete 05/04/2021  Allergen Reaction Noted   Iodinated diagnostic agents Hives 12/31/2008   Iohexol Hives 12/31/2008   Morphine Other (See Comments)     Vitals: BP 120/77   Pulse 74   Ht _0  (1.676 m)   Wt 140 lb 12.8 oz (63.9 kg)   BMI 22.73 kg/m  Last  Weight:  Wt Readings from Last 1 Encounters:  05/04/21 140 lb 12.8 oz (63.9 kg)   Last Height:   Ht Readings from Last 1 Encounters:  05/04/21 _1  (1.676 m)  Exam: NAD, pleasant                  Speech:    Speech is normal; fluent and spontaneous with normal comprehension.  Cognition:    The patient is oriented to person, place, and time;     recent and remote memory intact;     language fluent;    Cranial Nerves:    The pupils are equal, round, and reactive to light.Trigeminal sensation is intact and the muscles of mastication are normal. The face is symmetric. The palate elevates in the midline. Hearing intact. Voice is normal. Shoulder shrug is normal. The tongue has normal motion without fasciculations.   Coordination:  No dysmetria  Motor Observation:    No asymmetry, no atrophy, and no involuntary movements noted. Tone:    Normal muscle tone.     Strength:    Strength is V/V in the upper and lower limbs.      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 She cannot tolerate formal neurocog testing.  Addendum 06/19/2021: FDG Brain Scan did not show alzheimer's or frontotemporal. 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 We  discussed with granddaughter and said she would let us know if she wanted to go forward. We discussed with her, she has not given [place] the go ahead so we are at a standstill on whether to go forward. Just Fyi to her care team in cardiology and I did forward this to her primary care as well.   - FDG PET Scan for dementia alzheimer's vs FTD: was not completed FDG PET SCan approved again - Start Aricept/donepezil. In about 2 weeks if no side effects, mychart me and we can increase it to 46m -The second medication I would start is Namenda/memantine if  she does well on the higher dose of Aricept - [facility] brain study for granddaughter -The XX Brain for granddaughter -If the scan is not conclusive, we may consider formal memory testing  - 01/2021: RPR, bmp, b12, mma, tsh, thiamine, homocysteine, were all unremarkable   - Tiny RIGHT thalamic focus chronic hemorrhage, likely sequelae of hypertensive cerebrovascular disease. Monitor blood pressure   - Snoring, memory loss, daytime somnolence, morning headache: sleep eval was negative   - In the past referred to Home health agency for nursing for medication management, PT for gait and safety for repeated falls and weakness. Here with [facility] [name]'s number is [phone].   - Chronic headaches: Namenda may help, it is used in migraine and headache management. Could try in future  Orders Placed This Encounter  Procedures   [place]  Medications   donepezil (ARICEPT) 5 MG tablet    Sig: Take 1 tablet (5 mg total) by mouth at bedtime.    Dispense:  30 tablet    Refill:  1[place] MD  [facility] [address] [address] Phone [phone] [phone] I spent 30 minutes of face-to-face and non-face-to-face time with patient on the  1. Moderate dementia with psychotic disturbance, unspecified dementia type    diagnosis.  This included previsit chart review, lab review, study review, order entry, electronic health record documentation, patient education on the different diagnostic and therapeutic options, counseling and coordination of care, risks and benefits of management, compliance, or risk factor reduction

## 2021-05-04 NOTE — Telephone Encounter (Signed)
Order for home health was put in. Patient has [facility] Medicare which is a very difficult insurance to place with a home health agency. Many home health agencies will not accept this insurance because it does not pay well for home health. None of the agencies we work with ([facility], [facility], [facility], [facility], [facility]) will accept this insurance at this time.   Is is possible for patient to go to outpatient physical therapy?

## 2021-05-05 NOTE — Telephone Encounter (Signed)
I called grand daughter Aniceto Boss,  I relayed that per our referral dept, that  certain HHA not accepting [facility] medicare.  I relayed this to her but also she will look at message on mychart.  She will call insurance and ask about other HHA to use that will accept her insurance then let us know.

## 2021-05-09 NOTE — Telephone Encounter (Signed)
Updated [place]: [id number] (exp. 05/09/21 to 06/23/21) order sent for scheduling.

## 2021-05-19 ENCOUNTER — Telehealth: Payer: Self-pay | Admitting: Neurology

## 2021-05-19 ENCOUNTER — Other Ambulatory Visit: Payer: Self-pay

## 2021-05-19 ENCOUNTER — Ambulatory Visit
Admission: RE | Admit: 2021-05-19 | Discharge: 2021-05-19 | Disposition: A | Discharge status: Home / Self Care | Payer: Medicare Other | Source: Ambulatory Visit | Attending: Neurology | Admitting: Neurology

## 2021-05-19 DIAGNOSIS — R410 Disorientation, unspecified: Secondary | ICD-10-CM | POA: Diagnosis not present

## 2021-05-19 DIAGNOSIS — F688 Other specified disorders of adult personality and behavior: Secondary | ICD-10-CM | POA: Diagnosis not present

## 2021-05-19 DIAGNOSIS — G309 Alzheimer's disease, unspecified: Secondary | ICD-10-CM | POA: Insufficient documentation

## 2021-05-19 DIAGNOSIS — F03918 Unspecified dementia, unspecified severity, with other behavioral disturbance: Secondary | ICD-10-CM

## 2021-05-19 DIAGNOSIS — R4189 Other symptoms and signs involving cognitive functions and awareness: Secondary | ICD-10-CM | POA: Diagnosis not present

## 2021-05-19 LAB — GLUCOSE, CAPILLARY: Glucose-Capillary: 92 mg/dL (ref 70–99)

## 2021-05-19 MED ORDER — FLUDEOXYGLUCOSE F - 18 (FDG) INJECTION
10.0000 | Freq: Once | INTRAVENOUS | Status: AC | PRN
  Administered 2021-05-19: 10.2 via INTRAVENOUS

## 2021-05-19 NOTE — Telephone Encounter (Signed)
I received a request for a call back from [facility] imaging.  Dr. Ilda Foil had just completed looking at the FDG brain PET scan.  His report was not officially in yet but he wanted to let us know that overall the findings were not consistent with classic decrease in metabolism as seen in frontotemporal dementia or even an Alzheimer's dementia.  He did see some decreased metabolism in the medial occipital lobes bilaterally which can be seen in patients with Lewy body dementia but there is associated decreased metabolism concomitant in the parietal lobes typically which was not the case in her case.  He stumbled upon the possibility in his research reading that decreased uptake in this pattern can be seen with anti-NMDA receptor encephalitis.   Clinical correlation is of course recommended by him.  As far as I know, there could be additional testing that can be done (spinal tap?), some cases are associated with an underlying cancer diagnosis including ovarian cancer.  I wanted to let Dr. Jaynee Eagles know.  Use official scan result/report was not in at the time of this dictation.

## 2021-06-07 ENCOUNTER — Telehealth: Payer: Self-pay | Admitting: Neurology

## 2021-06-07 NOTE — Telephone Encounter (Signed)
Please call and discuss and let me know what family decides: The PET Scan showed abnormalities in the occipital lobes(back of the head where the visual fields of the brain exist). This may be why patient is having hallucinations. But it did not see Alzheimers or Frontotemporal dementia. We need further testing including a spinal tap to look for other causes of brain inflammation and possibly a CT scan of the abdomen and pelvis to rule out cancers that can cause brain inflammation in this area. Please call and if agreed I can place the orders thank you.

## 2021-06-08 NOTE — Telephone Encounter (Signed)
I called pt and relayed the results of her PET SCAN as below.  I explained to grand daughter, Aniceto Boss, that Dr. Jaynee Eagles recommends LP to look for other causes of brain inflammation but also CT pelvis abdomen to other conditions that may cause inflammation as well.  I explained that LP this is done at imaging center under xray (she will be on bedrest for 24 hours (except BRP) hydrating with caffienated products).  She will discuss with her family and then call us back tomorrow.  I relayed that she may speak to [place] or myself.  She verbalized understanding.

## 2021-06-13 NOTE — Telephone Encounter (Signed)
June 08, 2021 Brandon Melnick, RN      1:17 PM Note I called pt and relayed the results of her PET SCAN as below.  I explained to grand daughter, Aniceto Boss, that Dr. Jaynee Eagles recommends LP to look for other causes of brain inflammation but also CT pelvis abdomen to other conditions that may cause inflammation as well.  I explained that LP this is done at imaging center under xray (she will be on bedrest for 24 hours (except BRP) hydrating with caffienated products).  She will discuss with her family and then call us back tomorrow.  I relayed that she may speak to [place] or myself.  She verbalized understanding.

## 2021-06-19 NOTE — Telephone Encounter (Signed)
FDG Brain Scan did not show alzheimer's or frontotemporal. 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 We discussed with granddaughter and said she would let us know if she wanted to go forward. We discussed with her, she has not given [place] the go ahead so we are at a standstill on whether to go forward. Just Fyi to her care team in cardiology and I did forward this to her primary care as well.

## 2021-10-25 ENCOUNTER — Ambulatory Visit (INDEPENDENT_AMBULATORY_CARE_PROVIDER_SITE_OTHER): Payer: Medicare Other | Admitting: Podiatry

## 2021-10-25 ENCOUNTER — Encounter: Payer: Self-pay | Admitting: Podiatry

## 2021-10-25 DIAGNOSIS — B351 Tinea unguium: Secondary | ICD-10-CM

## 2021-10-25 DIAGNOSIS — M79674 Pain in right toe(s): Secondary | ICD-10-CM | POA: Diagnosis not present

## 2021-10-25 DIAGNOSIS — M79675 Pain in left toe(s): Secondary | ICD-10-CM

## 2021-10-25 NOTE — Progress Notes (Signed)
   SUBJECTIVE Patient presents to office today complaining of elongated, thickened nails that cause pain while ambulating in shoes.  Patient is unable to trim their own nails. Patient is here for further evaluation and treatment.  Past Medical History:  Diagnosis Date   Anxiety state, unspecified    Arthropathy, unspecified, site unspecified    Benign carcinoid tumor of the duodenum    Blindness of one eye    Breast cancer ([facility]) 2009   Cancer ([facility])    breast   Colon cancer ([facility])    Depressive disorder, not elsewhere classified    Diarrhea    Disease of gallbladder    Esophageal cancer ([facility])    GERD (gastroesophageal reflux disease)    Headache(784.0)    Hyperlipidemia    Hypertension    Kidney stones    OA (osteoarthritis) of knee    right   Personal history of radiation therapy    Syncope and collapse    Unspecified ectopic pregnancy without intrauterine pregnancy     OBJECTIVE General Patient is awake, alert, and oriented x 3 and in no acute distress. Derm Skin is dry and supple bilateral. Negative open lesions or macerations. Remaining integument unremarkable. Nails are tender, long, thickened and dystrophic with subungual debris, consistent with onychomycosis, 1-5 bilateral. No signs of infection noted. Vasc  DP and PT pedal pulses palpable bilaterally. Temperature gradient within normal limits.  Neuro Epicritic and protective threshold sensation grossly intact bilaterally.  Musculoskeletal Exam No symptomatic pedal deformities noted bilateral. Muscular strength within normal limits.  ASSESSMENT 1.  Pain due to onychomycosis of toenails both  PLAN OF CARE 1. Patient evaluated today.  2. Instructed to maintain good pedal hygiene and foot care.  3. Mechanical debridement of nails 1-5 bilaterally performed using a nail nipper. Filed with dremel without incident.  4. Return to clinic in 3 mos.    Averie Hornbaker M. Mylei Brackeen, DPM [facility]  Dr. Nahmir Zeidman M. Tom R[place], DPM     [address]                                     [place], [place] [address]                Office [phone]  Fax [phone]    

## 2021-10-26 ENCOUNTER — Encounter: Payer: Self-pay | Admitting: Podiatry

## 2021-10-27 NOTE — Telephone Encounter (Signed)
Please advise 

## 2021-10-28 ENCOUNTER — Other Ambulatory Visit: Payer: Self-pay | Admitting: Podiatry

## 2021-10-28 MED ORDER — DOXYCYCLINE HYCLATE 100 MG PO TABS: 100.0000 mg | ORAL_TABLET | Freq: Two times a day (BID) | ORAL | 0 refills | Status: DC

## 2021-11-14 ENCOUNTER — Ambulatory Visit: Payer: Medicare Other | Admitting: Adult Health

## 2021-11-14 ENCOUNTER — Encounter: Payer: Self-pay | Admitting: Adult Health

## 2022-02-20 ENCOUNTER — Encounter: Payer: Self-pay | Admitting: Neurology

## 2022-02-20 ENCOUNTER — Ambulatory Visit (INDEPENDENT_AMBULATORY_CARE_PROVIDER_SITE_OTHER): Payer: Medicare Other | Admitting: Neurology

## 2022-02-20 VITALS — BP 125/77 | HR 61 | Ht 60.0 in | Wt 138.0 lb

## 2022-02-20 DIAGNOSIS — R269 Unspecified abnormalities of gait and mobility: Secondary | ICD-10-CM

## 2022-02-20 DIAGNOSIS — F03B11 Unspecified dementia, moderate, with agitation: Secondary | ICD-10-CM | POA: Diagnosis not present

## 2022-02-20 DIAGNOSIS — F02B11 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, moderate, with agitation: Secondary | ICD-10-CM

## 2022-02-20 DIAGNOSIS — G8929 Other chronic pain: Secondary | ICD-10-CM

## 2022-02-20 DIAGNOSIS — R519 Headache, unspecified: Secondary | ICD-10-CM | POA: Diagnosis not present

## 2022-02-20 DIAGNOSIS — W19XXXA Unspecified fall, initial encounter: Secondary | ICD-10-CM

## 2022-02-20 DIAGNOSIS — R4189 Other symptoms and signs involving cognitive functions and awareness: Secondary | ICD-10-CM | POA: Insufficient documentation

## 2022-02-20 MED ORDER — DONEPEZIL HCL 5 MG/DAY TD PTWK: 5.0000 mg | MEDICATED_PATCH | TRANSDERMAL | 6 refills | Status: DC

## 2022-02-20 NOTE — Progress Notes (Signed)
[facility]    Provider:  Dr  Referring Provider: Patel, Sarah, MD Primary Care Physician:  Patel, Sarah, MD  CC:  dementia  9/18.2023: Here with her granddaughter, followup on demenita, she is on memantine a titration pack. Grandaughter is in and out. When she is there she gets patient's medication. She cannot tolerate memantine. She vomits with aricept and annot take any medication orally difficulty swallowing we will try and get it approved. Memantine is also making her vomit and diarrhea, she drinks a lot of ensure. Weight is stable. No ckogin. She fell once and hit her head, a month ago, no changes since then, no signs of concusion or ongoing symptoms, will monitor, has been to PT in the past. Needs a life alert. She may need a  memory unit, we discussed today. Discussed [facility] and I gave her the information. Discussed with grandaughter she nees more help. No trouble swallowing. Weight loss and cognitive decline, palliative hospice would be good discussed.   Patient complains of symptoms per HPI as well as the following symptoms: dementia, agitation . Pertinent negatives and positives per HPI. All others negative   Addendum 06/19/2021: FDG Brain Scan did not show alzheimer's or frontotemporal. 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 We discussed with granddaughter and said she would let us know if she wanted to go forward. We discussed with her, she has not given us the go ahead so we are at a standstill on whether to go forward. Just Fyi to her care team in cardiology and I did forward this to her primary care as well.  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 She feels the TV is talking to her, delusions/hallucinations, family is here, her MMSE is 19/30 and shows a steady decline, we ordered the FDG PEt Scan but she  did not get It completed so we will get it re-approved and send to memory testing to see if this can give us an answer, likely alzheimers but given her other symptoms like hallucinations/delusions, personality changes, needs to rule out FTD. MRI of the brain in 2017 was largely normal for age so will not repeat at this time.    HPI: memory loss.  We seen patient in the past for memory loss but its been over 3 years.  She has a past medical history of traumatic brain injury, hypertension, headaches, Bradycardia, syncope and collapse, daytime somnolence, memory loss, snoring, hyperlipidemia.  In the past we started her on Aricept and she could not tolerate it.  MMSE is in the past and fluctuated between 20-25/30.  Patient had a sleep evaluation in the past without significant findings.  I reviewed notes, when we first saw her in 2017 she was here with her granddaughter and daughter who stated she repeated herself which it started 2 years prior, may be brother in his 70s with memory loss but no history of dementia, it was more short-term memory forgetting things like names and faces.  She was not driving at the time.  She was needing help for bills.  MRI of the brain in the past showed mild atrophy and chronic microvascular ischemic changes with tiny right thalamic focus of chronic hemorrhage and she was advised to monitor blood pressure.  We sent her to PT for gait and safety.  I also reviewed [facility] from [facility], recent labs in March of this year showed a BUN of 14, creatinine 1.24, EGFR 44, vitamin D very low at 21, B12 368, repeat MRI in the past September 2018 was normal for age.  Here with her granddaughter, memory is worsening, granddaughter provides muchinformation, she forgets conversations, patient states she doesn't forget things at home or any accidents in the home, they don;t let her use the stove anymore per granddaughter they unplug the stove, family is there often, more  short-term memory loss, progressive, ongoing for at least 5 years, she continues to forget things, she is more angry all the time, less social, personality changes, irritable, not driving. She refuses social, won;t go to to an adult daycare, family pays the bills, unclear family history of dementia but brother had memory issues and didn;t know who patient was.    MRI brain 2018: reviewed images and agree: IMPRESSION: 1.  No metastatic disease or acute intracranial abnormality. 2. Stable since 2017 and largely unremarkable for age MRI appearance of the brain  Interval history 10/05/2016: She still has headaches sometimes. She have headaches at least 3x a week.  Here with her granddaughter [name]. She could not tolerate the Aricept. She refuses anymore medications. Could benefit from continued PT. Will send [facility] back. Wants [facility] same people that were there before.   Interval history 07/06/2016: Patient returns today. MMSE improved from 20/30 to 25/30. She wants to discuss a new problem headaches. She has had a few falls in her home. A toy was on the floor and she just fell on the floor but didn;t hit anything. Sleep study was negative. She has headaches. She has "high pressures in my eyes" and that is being treated. Headaches are in the frontal areas. She has them every day. Ibuprofen helps. She takes it every once in a while. She has been complaining of the headaches and dizziness more frequently. Daughter feels something is different. They are going to see the eye doctor and her primary care she feels she is lightheaded.  She has headaches when she gets up and the headaches are there tea helps. She has always had headaches and sometimes they are worse. She has had headaches all her life. She used to have migraines.She gets agitated. She does drinka  Lot of fluids.    [name] 03/2016: HISTORY OF PRESENT ILLNESS: [name] is a 81 year old female with a history of confusional arousals, snoring and  sleep disturbance. She returns today to discuss her ONO results. I explained to the patient that Dr. Brett Fairy has reviewed her ONO study and reports that it is normal. She does not qualify for supplemental oxygen at night. Patient verbalized understanding. At the last visit Dr. Brett Fairy encouraged the patient to use melatonin before bedtime. Her granddaughters with her and reports that they forgot to pick this up. They are willing to give this a try. She was also encouraged to participate in a senior citizen's group. She states that she's been going with a friend to a church group. At this group they do things such as watching movies and eating. Patient overall feels that she is doing okay. In the past she has been given Aricept however this caused vivid dreams. She is no longer on Aricept.     HPI 02/2016:  [name] is a 81 y.o. female here as a referral from Dr. Posey Pronto for memory loss. Past medical history of hypertension, high cholesterol, migraine, depression, anxiety, breast cancer and colon  cancer. Here with daughter and granddaughter who provide most information. She loses things. She repeats herself. Started 2 years ago and worsening. She had a brother in his 70s maybe with memory loss but also had a stroke, no FHx of Alzheimer's dementia. More short-term memory loss. She forgets things and later remembers it like names. She forgets names and faces. She has forgotten things that they discussed multiple times over the years and patient does not remember. A lot of little things. She lives alone. She does not drive. Patient pays the bills but needs help now. She needs help getting her medications. She does not want to eat. Her husband just dies a year ago and they were married 50 years ago. She has had migraine headaches for years and she has had chronic headaches for years since daughter remembers and now they feel like daily pressure hedaches. Granddaughter thinks she is depressed. Says that patient  will repeat things even if she just told them earlier. She hides things and can;t find them. Headaches are in the morning when she wakes up every day. She snores badly per family. Headaches are daily she wakes with them every morning.     Reviewed MRi brain images personally and with patient and family: Mild atrophy and chronic microvascular ischemic change. Tiny RIGHT thalamic focus chronic hemorrhage, likely sequelae of hypertensive cerebrovascular disease.   No acute intracranial findings are evident.     Reviewed notes, labs and imaging from outside physicians, which showed:    Labs drawn 12/30/2015: CMP was unremarkable with creatinine 0.91, B12 was 1037, folate 13.2.      Her primary care notes, she has been depressed, flat affect sometimes tearful, doesn't want to talk to counselor may be in the future. Patient has headache on and off. She suspected tension headache. Tried stretching increasing fluids for hydration, may take Tylenol, MRI was ordered.   MRI of the brain (personally reviewed images 01/22/2016) and agree with the following: Mild atrophy and chronic microvascular ischemic change. Tiny RIGHT thalamic focus chronic hemorrhage, likely sequelae of hypertensive cerebrovascular disease.   No acute intracranial findings are evident. Review of Systems: Patient complains of symptoms per HPI as well as the following symptoms: irritability/hallucinations . Pertinent negatives and positives per HPI. All others negative     Social History   Socioeconomic History   Marital status: Widowed    Spouse name: Not on file   Number of children: Not on file   Years of education: Not on file   Highest education level: 6th grade  Occupational History   Occupation: Retired  Tobacco Use   Smoking status: Never   Smokeless tobacco: Never  Vaping Use   Vaping Use: Never used  Substance and Sexual Activity   Alcohol use: No   Drug use: No   Sexual activity: Not on file  Other  Topics Concern   Not on file  Social History Narrative   Lives alone   Caffeine use: <1 cup coffee/day, 2-3 pepsi per day   Social Determinants of Health   Financial Resource Strain: Not on file  Food Insecurity: Not on file  Transportation Needs: Not on file  Physical Activity: Not on file  Stress: Not on file  Social Connections: Not on file  Intimate Partner Violence: Not on file    Family History  Problem Relation Age of Onset   Cancer Mother        unknown type   Cancer Father    Heart attack   Brother    Cancer Paternal Grandmother    Diabetes Daughter    Breast cancer Neg Hx    Alzheimer's disease Neg Hx     Past Medical History:  Diagnosis Date   Anxiety state, unspecified    Arthropathy, unspecified, site unspecified    Benign carcinoid tumor of the duodenum    Blindness of one eye    Breast cancer (HCC) 2009   Cancer (HCC)    breast   Colon cancer (HCC)    Depressive disorder, not elsewhere classified    Diarrhea    Disease of gallbladder    Esophageal cancer (HCC)    GERD (gastroesophageal reflux disease)    Headache(784.0)    Hyperlipidemia    Hypertension    Kidney stones    OA (osteoarthritis) of knee    right   Personal history of radiation therapy    Syncope and collapse    Unspecified ectopic pregnancy without intrauterine pregnancy     Past Surgical History:  Procedure Laterality Date   BREAST BIOPSY Bilateral    BREAST EXCISIONAL BIOPSY Right 2009   (+)   BREAST SURGERY Right    Cataract surgery Left 10/11/2017   Implant   CHOLECYSTECTOMY  05-27-12   COLON RESECTION     COLONOSCOPY     PARTIAL HYSTERECTOMY     TOTAL KNEE ARTHROPLASTY Right     Current Outpatient Medications  Medication Sig Dispense Refill   alendronate (FOSAMAX) 70 MG tablet alendronate 70 mg tablet  TAKE 1 TABLET BY MOUTH ONCE A WEEK     amLODipine (NORVASC) 10 MG tablet Take 10 mg by mouth daily.     aspirin 81 MG EC tablet Take 81 mg by mouth daily.       brimonidine (ALPHAGAN) 0.2 % ophthalmic solution brimonidine 0.2 % eye drops     Donepezil HCl 5 MG/DAY PTWK Place 5 mg onto the skin once a week. 7 patch 6   dorzolamide (TRUSOPT) 2 % ophthalmic solution Place 1 drop into both eyes 2 (two) times daily.     latanoprost (XALATAN) 0.005 % ophthalmic solution Place 1 drop into both eyes at bedtime.     sertraline (ZOLOFT) 100 MG tablet Take 150 mg by mouth daily.     timolol (TIMOPTIC) 0.5 % ophthalmic solution timolol maleate 0.5 % eye drops     famotidine (PEPCID) 20 MG tablet Take 1 tablet (20 mg total) by mouth daily. 30 tablet 0   No current facility-administered medications for this visit.    Allergies as of 02/20/2022 - Review Complete 02/20/2022  Allergen Reaction Noted   Iodinated contrast media Hives 12/31/2008   Iohexol Hives 12/31/2008   Morphine Other (See Comments)     Vitals: BP 125/77   Pulse 61   Ht 5' (1.524 m)   Wt 138 lb (62.6 kg)   BMI 26.95 kg/m  Last Weight:  Wt Readings from Last 1 Encounters:  02/20/22 138 lb (62.6 kg)   Last Height:   Ht Readings from Last 1 Encounters:  02/20/22 5' (1.524 m)   Exam: Irritable but charming and well cared for as usual              Speech:        02/20/2022   10:03 AM 05/04/2021    8:37 AM 01/11/2021   11:50 AM  MMSE - Mini Mental State Exam  Orientation to time 4 4 4  Orientation to Place 5 4 4  Registration 3   3 3  Attention/ Calculation 0 2 0  Recall 1 0 2  Language- name 2 objects _0 Language- repeat 0 1 0  Language- follow 3 step command _1 Language- read & follow direction _2 Write a sentence 0 0 0  Copy design 0 0 0  Total score _3 Cranial Nerves:    The pupils are equal, round, and reactive to light.Trigeminal sensation is intact and the muscles of mastication are normal. The face is symmetric. The palate elevates in the midline. Hearing intact. Voice is normal. Shoulder shrug is normal. The tongue has normal motion without  fasciculations.   Coordination:  No dysmetria  Motor Observation:    No asymmetry, no atrophy, and no involuntary movements noted. Tone:    Normal muscle tone.     Strength:    Strength is V/V in the upper and lower limbs.      Sensation: intact to LT  Gait: with cane, imbalance, bradykinetic      Assessment/Plan:  81 year old patient with dementia, FDG scan negative for alzheimers/fdg, has hallucinations and delusions maybe Lewy Body.  She cannot tolerate formal neurocog testing. A year ago 19/30 today MMSE 17/30  Addendum 06/19/2021: FDG Brain Scan did not show alzheimer's or frontotemporal. 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 We discussed with granddaughter and said she would let us know if she wanted to go forward. We discussed with her, she has not given [place] the go ahead so we are at a standstill on whether to go forward. Just Fyi to her care team in cardiology and I did forward this to her primary care as well.   Here with her granddaughter, she is on memantine a titration pack. Grandaughter is in and out of the house. When granddaughter is there she gets patient's medication. Patient states she cannot tolerate memantine. She vomits with aricept and cannot take any medication orally difficulty swallowing we will try and get patch approved. Memantine is also making her vomit and diarrhea,   - saw a geriatric doctor, granddaughter is also helping and sees her daily, still recommend a memory unit  - Could not tolerate aricept or namenda, vomiting, cannot swallow, will try a patch for aricept.   - [facility] brain study for granddaughter -The XX Brain for granddaughter  - 01/2021: RPR, bmp, b12, mma, tsh, thiamine, homocysteine, were all unremarkable   - Tiny RIGHT thalamic focus chronic hemorrhage, likely sequelae of hypertensive cerebrovascular disease. Monitor blood pressure   - Snoring,  memory loss, daytime somnolence, morning headache: sleep eval was negative   - In the past referred to Home health agency for nursing for medication management, PT for gait and safety for repeated falls and weakness. Here with [name]. [name]'s number is [phone] Continue. Will refer for gait abnormality, recent fall.  PT in [place]. We tried in the past will try again tpo have people go to the house.   - continue follow up with pcp, needs close follow up, life alert and nest cameras discussed  - CT of the head since a fall   - chronic headache, takes a tylenol, will take a CT of the head. She declines treatment. She just wants to to take tylenol, it helps, not severe. Continue primary care to check lab work. Always good.     Orders  Placed This Encounter  Procedures   Culture, Urine   CT HEAD WO CONTRAST (5MM)   CBC with Differential/Platelets   Comprehensive metabolic panel   TSH Rfx on Abnormal to Free T4   Urinalysis, Routine w reflex microscopic   Ambulatory referral to Home Health   Meds ordered this encounter  Medications   Donepezil HCl 5 MG/DAY PTWK    Sig: Place 5 mg onto the skin once a week.    Dispense:  7 patch    Refill:  6      , MD  [facility] [address] [place], [place] [address]  Phone [phone] Fax [phone]  I spent over 45 minutes of face-to-face and non-face-to-face time with patient on the  1. Moderate dementia with agitation, unspecified dementia type (HCC)   2. Cognitive decline   3. 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, moderate, with agitation (HCC)   4. Fall, initial encounter   5. Chronic nonintractable headache, unspecified headache type   6. Gait abnormality     diagnosis.  This included previsit chart review, lab review, study review, order entry, electronic health record documentation, patient education on the different diagnostic and therapeutic options,  counseling and coordination of care, risks and benefits of management, compliance, or risk factor reduction  

## 2022-02-20 NOTE — Patient Instructions (Addendum)
CT Head Bloodwork and urinalysis Recommend PT in the home - will try to order again Recommend trying to get [facility] involved Contact me if she can tolerate namenda '10mg'$  twice daily I can prescribe (on a titration pack now) Weight loss and cognitive decline, palliative hospice would be good discussed. Try Aricept patch low dose. Patient declines everything, but I spoke to granddaughter and asked to discuss with family  Discussed adult lawyer for guardianship, recommend, will need a memory unit or 24 x 7 care She has a Transport planner at [employer] center, discuss nursing and any help they can provide No wandering, discussed safety below  Fall Prevention in the Home, Adult Falls can cause injuries and affect people of all ages. There are many simple things that you can do to make your home safe and to help prevent falls. Ask for help when making these changes, if needed. What actions can I take to prevent falls? General instructions Use good lighting in all rooms. Replace any light bulbs that burn out, turn on lights if it is dark, and use night-lights. Place frequently used items in easy-to-reach places. Lower the shelves around your home if necessary. Set up furniture so that there are clear paths around it. Avoid moving your furniture around. Remove throw rugs and other tripping hazards from the floor. Avoid walking on wet floors. Fix any uneven floor surfaces. Add color or contrast paint or tape to grab bars and handrails in your home. Place contrasting color strips on the first and last steps of staircases. When you use a stepladder, make sure that it is completely opened and that the sides and supports are firmly locked. Have someone hold the ladder while you are using it. Do not climb a closed stepladder. Know where your pets are when moving through your home. What can I do in the bathroom?     Keep the floor dry. Immediately clean up any water that is on the floor. Remove  soap buildup in the tub or shower regularly. Use nonskid mats or decals on the floor of the tub or shower. Attach bath mats securely with double-sided, nonslip rug tape. If you need to sit down while you are in the shower, use a plastic, nonslip stool. Install grab bars by the toilet and in the tub and shower. Do not use towel bars as grab bars. What can I do in the bedroom? Make sure that a bedside light is easy to reach. Do not use oversized bedding that reaches the floor. Have a firm chair that has side arms to use for getting dressed. What can I do in the kitchen? Clean up any spills right away. If you need to reach for something above you, use a sturdy step stool that has a grab bar. Keep electrical cables out of the way. Do not use floor polish or wax that makes floors slippery. If you must use wax, make sure that it is non-skid floor wax. What can I do with my stairs? Do not leave any items on the stairs. Make sure that you have a light switch at the top and the bottom of the stairs. Have them installed if you do not have them. Make sure that there are handrails on both sides of the stairs. Fix handrails that are broken or loose. Make sure that handrails are as long as the staircases. Install non-slip stair treads on all stairs in your home. Avoid having throw rugs at the top or bottom of stairs,  or secure the rugs with carpet tape to prevent them from moving. Choose a carpet design that does not hide the edge of steps on the stairs. Check any carpeting to make sure that it is firmly attached to the stairs. Fix any carpet that is loose or worn. What can I do on the outside of my home? Use bright outdoor lighting. Regularly repair the edges of walkways and driveways and fix any cracks. Remove high doorway thresholds. Trim any shrubbery on the main path into your home. Regularly check that handrails are securely fastened and in good repair. Both sides of all steps should have  handrails. Install guardrails along the edges of any raised decks or porches. Clear walkways of debris and clutter, including tools and rocks. Have leaves, snow, and ice cleared regularly. Use sand or salt on walkways during winter months. In the garage, clean up any spills right away, including grease or oil spills. What other actions can I take? Wear closed-toe shoes that fit well and support your feet. Wear shoes that have rubber soles or low heels. Use mobility aids as needed, such as canes, walkers, scooters, and crutches. Review your medicines with your health care provider. Some medicines can cause dizziness or changes in blood pressure, which increase your risk of falling. Talk with your health care provider about other ways that you can decrease your risk of falls. This may include working with a physical therapist or trainer to improve your strength, balance, and endurance. Where to find more information Centers for Disease Control and Prevention, [employer]: http://www.wolf.info/ National Institute on Aging: http://kim-miller.com/ Contact a health care provider if: You are afraid of falling at home. You feel weak, drowsy, or dizzy at home. You fall at home. Summary There are many simple things that you can do to make your home safe and to help prevent falls. Ways to make your home safe include removing tripping hazards and installing grab bars in the bathroom. Ask for help when making these changes in your home. This information is not intended to replace advice given to you by your health care provider. Make sure you discuss any questions you have with your health care provider. Document Revised: 02/21/2021 Document Reviewed: 12/24/2019 Elsevier Patient Education  [place] ordered this encounter  Medications   Donepezil HCl 5 MG/DAY PTWK    Sig: Place 5 mg onto the skin once a week.    Dispense:  7 patch    Refill:  6   Orders Placed This Encounter  Procedures   Culture,  Urine   CT HEAD WO CONTRAST (5MM)   CBC with Differential/Platelets   Comprehensive metabolic panel   TSH Rfx on Abnormal to Free T4   Urinalysis, Routine w reflex microscopic    Dementia Caregiver Guide Dementia is a term used to describe a number of symptoms that affect memory and thinking. The most common symptoms include: Memory loss. Trouble with language and communication. Trouble concentrating. Poor judgment and problems with reasoning. Wandering from home or public places. Extreme anxiety or depression. Being suspicious or having angry outbursts and accusations. Child-like behavior and language. Dementia can be frightening and confusing. And taking care of someone with dementia can be challenging. This guide provides tips to help you when providing care for a person with dementia. How to help manage lifestyle changes Dementia usually gets worse slowly over time. In the early stages, people with dementia can stay independent and safe with some help. In later stages,  they need help with daily tasks such as dressing, grooming, and using the bathroom. There are actions you can take to help a person manage his or her life while living with this condition. Communicating When the person is talking or seems frustrated, make eye contact and hold the person's hand. Ask specific questions that need yes or no answers. Use simple words, short sentences, and a calm voice. Only give one direction at a time. When offering choices, limit the person to just one or two. Avoid correcting the person in a negative way. If the person is struggling to find the right words, gently try to help him or her. Preventing injury  Keep floors clear of clutter. Remove rugs, magazine racks, and floor lamps. Keep hallways well lit, especially at night. Put a handrail and nonslip mat in the bathtub or shower. Put childproof locks on cabinets that contain dangerous items, such as medicines, alcohol, guns, toxic  cleaning items, sharp tools or utensils, matches, and lighters. For doors to the outside of the house, put the locks in places where the person cannot see or reach them easily. This will help ensure that the person does not wander out of the house and get lost. Be prepared for emergencies. Keep a list of emergency phone numbers and addresses in a convenient area. Remove car keys and lock garage doors so that the person does not try to get in the car and drive. Have the person wear a bracelet that tracks locations and identifies the person as having memory problems. This should be worn at all times for safety. Helping with daily life  Keep the person on track with his or her routine. Try to identify areas where the person may need help. Be supportive, patient, calm, and encouraging. Gently remind the person that adjusting to changes takes time. Help with the tasks that the person has asked for help with. Keep the person involved in daily tasks and decisions as much as possible. Encourage conversation, but try not to get frustrated if the person struggles to find words or does not seem to appreciate your help. How to recognize stress Look for signs of stress in yourself and in the person you are caring for. If you notice signs of stress, take steps to manage it. Symptoms of stress include: Feeling anxious, irritable, frustrated, or angry. Denying that the person has dementia or that his or her symptoms will not improve. Feeling depressed, hopeless, or unappreciated. Difficulty sleeping. Difficulty concentrating. Developing stress-related health problems. Feeling like you have too little time for your own life. Follow these instructions at home: Take care of your health Make sure that you and the person you are caring for: Get regular sleep. Exercise regularly. Eat regular, nutritious meals. Take over-the-counter and prescription medicines only as told by your health care providers. Drink  enough fluid to keep your urine pale yellow. Attend all scheduled health care appointments.  General instructions Join a support group with others who are caregivers. Ask about respite care resources. Respite care can provide short-term care for the person so that you can have a regular break from the stress of caregiving. Consider any safety risks and take steps to avoid them. Organize medicines in a pill box for each day of the week. Create a plan to handle any legal or financial matters. Get legal or financial advice if needed. Keep a calendar in a central location to remind the person of appointments or other activities. Where to find support: Many individuals  and organizations offer support. These include: Support groups for people with dementia. Support groups for caregivers. Counselors or therapists. Home health care services. Adult day care centers. Where to find more information Centers for Disease Control and Prevention: http://www.wolf.info/ Alzheimer's Association: [place]Mile.co.nz Family Caregiver Alliance: www.caregiver.[place]: www.alzfdn.org Contact a health care provider if: The person's health is rapidly getting worse. You are no longer able to care for the person. Caring for the person is affecting your physical and emotional health. You are feeling depressed or anxious about caring for the person. Get help right away if: The person threatens himself or herself, you, or anyone else. You feel depressed or sad, or feel that you want to harm yourself. If you ever feel like your loved one may hurt himself or herself or others, or if he or she shares thoughts about taking his or her own life, get help right away. You can go to your nearest emergency department or: Call your local emergency services (911 in the [place] Call a suicide crisis helpline, such as the [place] [facility] at 5486149646 or 988 in the [place]. This is open 24 hours a  day in the [place] Text the Crisis Text Line at (386)406-8078 (in the [place].). Summary Dementia is a term used to describe a number of symptoms that affect memory and thinking. Dementia usually gets worse slowly over time. Take steps to reduce the person's risk of injury and to plan for future care. Caregivers need support, relief from caregiving, and time for their own lives. This information is not intended to replace advice given to you by your health care provider. Make sure you discuss any questions you have with your health care provider. Document Revised: 12/15/2020 Document Reviewed: 10/06/2019 Elsevier Patient Education  [place].

## 2022-02-21 ENCOUNTER — Telehealth: Payer: Self-pay | Admitting: Neurology

## 2022-02-21 NOTE — Telephone Encounter (Signed)
[employer] medicare NPR sent to [employer]

## 2022-02-22 LAB — URINALYSIS, ROUTINE W REFLEX MICROSCOPIC
Bilirubin, UA: NEGATIVE
Glucose, UA: NEGATIVE
Ketones, UA: NEGATIVE
Nitrite, UA: NEGATIVE
Protein,UA: NEGATIVE
Specific Gravity, UA: 1.019 (ref 1.005–1.030)
Urobilinogen, Ur: 0.2 mg/dL (ref 0.2–1.0)
pH, UA: 5.5 (ref 5.0–7.5)

## 2022-02-22 LAB — MICROSCOPIC EXAMINATION
Casts: NONE SEEN /lpf
RBC, Urine: NONE SEEN /hpf (ref 0–2)

## 2022-02-22 LAB — CBC WITH DIFFERENTIAL/PLATELET
Basophils Absolute: 0 10*3/uL (ref 0.0–0.2)
Basos: 1 %
EOS (ABSOLUTE): 0.1 10*3/uL (ref 0.0–0.4)
Eos: 2 %
Hematocrit: 37.1 % (ref 34.0–46.6)
Hemoglobin: 12.5 g/dL (ref 11.1–15.9)
Immature Grans (Abs): 0 10*3/uL (ref 0.0–0.1)
Immature Granulocytes: 0 %
Lymphocytes Absolute: 1.7 10*3/uL (ref 0.7–3.1)
Lymphs: 37 %
MCH: 30.9 pg (ref 26.6–33.0)
MCHC: 33.7 g/dL (ref 31.5–35.7)
MCV: 92 fL (ref 79–97)
Monocytes Absolute: 0.5 10*3/uL (ref 0.1–0.9)
Monocytes: 11 %
Neutrophils Absolute: 2.3 10*3/uL (ref 1.4–7.0)
Neutrophils: 49 %
Platelets: 173 10*3/uL (ref 150–450)
RBC: 4.04 x10E6/uL (ref 3.77–5.28)
RDW: 14.6 % (ref 11.7–15.4)
WBC: 4.6 10*3/uL (ref 3.4–10.8)

## 2022-02-22 LAB — COMPREHENSIVE METABOLIC PANEL
ALT: 19 IU/L (ref 0–32)
AST: 27 IU/L (ref 0–40)
Albumin/Globulin Ratio: 1.9 (ref 1.2–2.2)
Albumin: 4.5 g/dL (ref 3.8–4.8)
Alkaline Phosphatase: 70 IU/L (ref 44–121)
BUN/Creatinine Ratio: 14 (ref 12–28)
BUN: 17 mg/dL (ref 8–27)
Bilirubin Total: 0.4 mg/dL (ref 0.0–1.2)
CO2: 22 mmol/L (ref 20–29)
Calcium: 9.6 mg/dL (ref 8.7–10.3)
Chloride: 104 mmol/L (ref 96–106)
Creatinine, Ser: 1.23 mg/dL — ABNORMAL HIGH (ref 0.57–1.00)
Globulin, Total: 2.4 g/dL (ref 1.5–4.5)
Glucose: 80 mg/dL (ref 70–99)
Potassium: 3.7 mmol/L (ref 3.5–5.2)
Sodium: 141 mmol/L (ref 134–144)
Total Protein: 6.9 g/dL (ref 6.0–8.5)
eGFR: 44 mL/min/{1.73_m2} — ABNORMAL LOW (ref >59)

## 2022-02-22 LAB — URINE CULTURE

## 2022-02-22 LAB — TSH RFX ON ABNORMAL TO FREE T4: TSH: 0.634 u[IU]/mL (ref 0.450–4.500)

## 2022-02-23 ENCOUNTER — Telehealth: Payer: Self-pay | Admitting: *Deleted

## 2022-02-23 MED ORDER — DONEPEZIL HCL 5 MG/DAY TD PTWK: 5.0000 mg | MEDICATED_PATCH | TRANSDERMAL | 6 refills | Status: DC

## 2022-02-23 NOTE — Telephone Encounter (Signed)
PA Adlarity complete over the pone with insurance company Waiting on approval   Reference# [id number]

## 2022-02-23 NOTE — Addendum Note (Signed)
Addended by: Sarina Ill B on: 02/23/2022 05:13 PM   Modules accepted: Orders

## 2022-03-02 ENCOUNTER — Telehealth: Payer: Self-pay | Admitting: Neurology

## 2022-03-02 NOTE — Telephone Encounter (Signed)
[facility] and [facility] all declined this patient due to insurance/location. I am still working on finding a place that can help her.

## 2022-03-02 NOTE — Telephone Encounter (Signed)
[name] also can't take this patient. [name] will you try other places please

## 2022-03-06 NOTE — Telephone Encounter (Signed)
Due to insurance purposes this patient will have to be out patient. Five home health companies can not take her because of her insurance.

## 2022-03-07 ENCOUNTER — Other Ambulatory Visit: Payer: Self-pay | Admitting: *Deleted

## 2022-03-07 MED ORDER — MEMANTINE HCL 10 MG PO TABS: 10.0000 mg | ORAL_TABLET | Freq: Two times a day (BID) | ORAL | 3 refills | Status: DC

## 2022-03-07 NOTE — Addendum Note (Signed)
Addended by: Skeet Simmer A on: 03/07/2022 05:36 PM   Modules accepted: Orders

## 2022-03-07 NOTE — Telephone Encounter (Signed)
Spoke to [employer] (checked DPR) Granddaughter states patient is during well on Namenda titration pack. Granddaughter states she is during  a little better taking namenda so perfers if she remains on medication . Per Dr Jaynee Eagles that's fine she stays on Namenda . Please complete titrations pack and patient tolerates well . Dr Jaynee Eagles will send order for Namenda  to pharmacy

## 2022-03-16 NOTE — Telephone Encounter (Signed)
[employer] has a denied request on file for ADLARITY DIS '5MG'$ /DAY for this member. Please refer to the appeals process outlined in the original denial or contact Prior Authorization Department at [phone] for further questions.  See phone note from 03/02/22. Pt is on Namenda.

## 2022-03-24 ENCOUNTER — Ambulatory Visit
Admission: RE | Admit: 2022-03-24 | Discharge: 2022-03-24 | Disposition: A | Discharge status: Home / Self Care | Payer: Medicare Other | Source: Ambulatory Visit | Attending: Neurology | Admitting: Neurology

## 2022-03-24 DIAGNOSIS — Y939 Activity, unspecified: Secondary | ICD-10-CM | POA: Diagnosis not present

## 2022-03-24 DIAGNOSIS — R4189 Other symptoms and signs involving cognitive functions and awareness: Secondary | ICD-10-CM | POA: Insufficient documentation

## 2022-03-24 DIAGNOSIS — W19XXXA Unspecified fall, initial encounter: Secondary | ICD-10-CM | POA: Diagnosis not present

## 2022-03-24 DIAGNOSIS — Y929 Unspecified place or not applicable: Secondary | ICD-10-CM | POA: Diagnosis not present

## 2022-03-24 DIAGNOSIS — I6782 Cerebral ischemia: Secondary | ICD-10-CM | POA: Insufficient documentation

## 2022-03-24 DIAGNOSIS — R519 Headache, unspecified: Secondary | ICD-10-CM | POA: Diagnosis not present

## 2022-03-24 DIAGNOSIS — F03B11 Unspecified dementia, moderate, with agitation: Secondary | ICD-10-CM

## 2022-03-24 DIAGNOSIS — F02B11 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, moderate, with agitation: Secondary | ICD-10-CM | POA: Diagnosis present

## 2022-03-24 DIAGNOSIS — G8929 Other chronic pain: Secondary | ICD-10-CM | POA: Insufficient documentation

## 2022-05-05 ENCOUNTER — Ambulatory Visit (INDEPENDENT_AMBULATORY_CARE_PROVIDER_SITE_OTHER): Payer: Medicare Other | Admitting: Podiatry

## 2022-05-05 DIAGNOSIS — M79674 Pain in right toe(s): Secondary | ICD-10-CM | POA: Diagnosis not present

## 2022-05-05 DIAGNOSIS — M79675 Pain in left toe(s): Secondary | ICD-10-CM

## 2022-05-05 DIAGNOSIS — B351 Tinea unguium: Secondary | ICD-10-CM | POA: Diagnosis not present

## 2022-05-05 NOTE — Progress Notes (Signed)
   SUBJECTIVE Patient presents to office today complaining of elongated, thickened nails that cause pain while ambulating in shoes.  Patient is unable to trim their own nails. Patient is here for further evaluation and treatment.  Past Medical History:  Diagnosis Date   Anxiety state, unspecified    Arthropathy, unspecified, site unspecified    Benign carcinoid tumor of the duodenum    Blindness of one eye    Breast cancer ([place]) 2009   Cancer [facility])    breast   Colon cancer ([place])    Depressive disorder, not elsewhere classified    Diarrhea    Disease of gallbladder    Esophageal cancer ([facility])    GERD (gastroesophageal reflux disease)    Headache(784.0)    Hyperlipidemia    Hypertension    Kidney stones    OA (osteoarthritis) of knee    right   Personal history of radiation therapy    Syncope and collapse    Unspecified ectopic pregnancy without intrauterine pregnancy     OBJECTIVE General Patient is awake, alert, and oriented x 3 and in no acute distress. Derm Skin is dry and supple bilateral. Negative open lesions or macerations. Remaining integument unremarkable. Nails are tender, long, thickened and dystrophic with subungual debris, consistent with onychomycosis, 1-5 bilateral. No signs of infection noted. Vasc  DP and PT pedal pulses palpable bilaterally. Temperature gradient within normal limits.  Neuro Epicritic and protective threshold sensation grossly intact bilaterally.  Musculoskeletal Exam No symptomatic pedal deformities noted bilateral. Muscular strength within normal limits.  ASSESSMENT 1.  Pain due to onychomycosis of toenails both  PLAN OF CARE 1. Patient evaluated today.  2. Instructed to maintain good pedal hygiene and foot care.  3. Mechanical debridement of nails 1-5 bilaterally performed using a nail nipper. Filed with dremel without incident.  4. Return to clinic in 3 mos.    Edrick Kins, DPM [facility]  Dr. Edrick Kins, DPM     [address], [place] [address]                Office [phone]  Fax [phone]

## 2022-05-15 IMAGING — PT NM PET TUM IMG INITIAL (PI) WHOLE BODY
1 of 8 series · 4 of 25 positions shown · non-contrast
Comparison: None.

CLINICAL DATA: Dementia. Frontotemporal dementia versus Alzheimer's
type dementia

EXAM:
NM PET METABOLIC BRAIN
TECHNIQUE: 10.2 mCi F-18 FDG was injected intravenously. Full-ring PET imaging
was performed from the vertex to skull base. CT data was obtained
and used for attenuation correction and anatomic localization.
FASTING BLOOD GLUCOSE:  Value: 92 mg/dl

[Series 3: ct brain 3.0 h31s · axial · 3.0mm · 0.49mm/px · z∈[-291,-192]mm · 4 of 110 slices shown]
[im 22/110  brain]
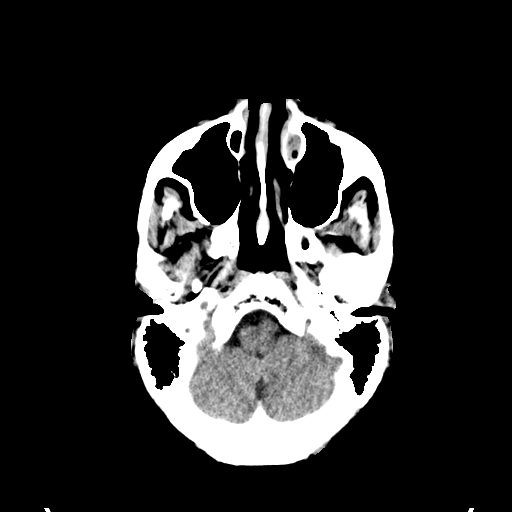
[im 44/110  brain]
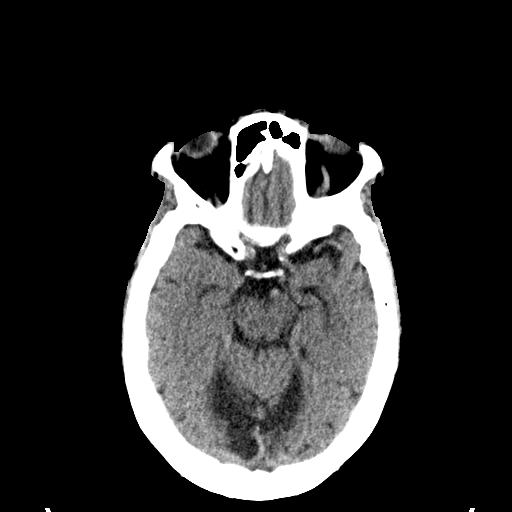
[im 66/110  brain]
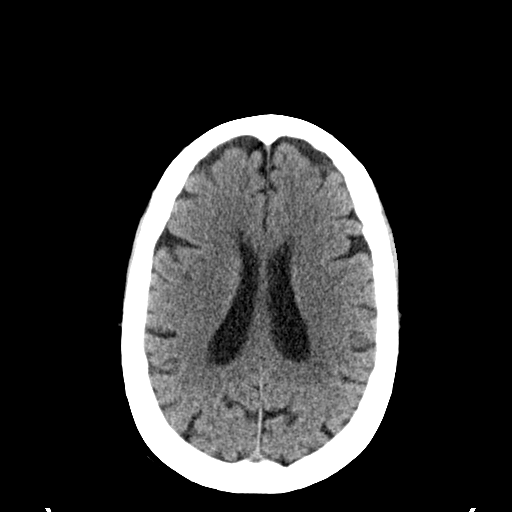
[im 88/110  brain]
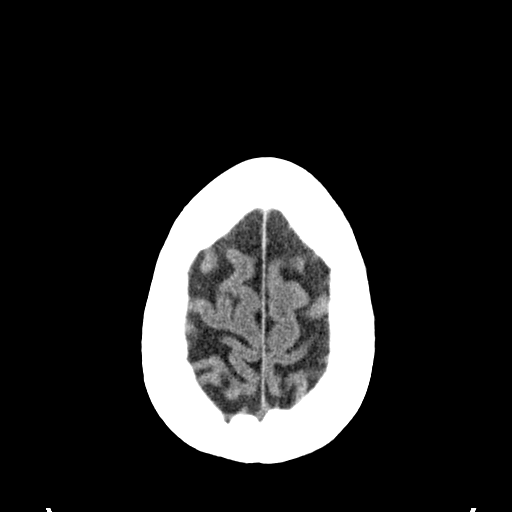

[4 of 25 positions shown; findings below may reference images not displayed]

FINDINGS: Normal relative cortical metabolism in the parietal lobes. Normal
relative cortical metabolism frontal lobes. Relative normal cortical
metabolism in the temporal lobes.

There is decreased relative cortical metabolism within the medial
occipital lobes.
IMPRESSION: 1. No decreased relative cortical metabolism to suggest Alzheimer's
pathology or frontotemporal dementia.
2. Decreased relative cortical metabolism within medial occipital
lobes. Finding may correlate with patient's hallucinations symptoms.

## 2022-08-07 ENCOUNTER — Telehealth: Payer: Self-pay | Admitting: Adult Health

## 2022-08-07 NOTE — Telephone Encounter (Signed)
LVM and sent mychart msg informing pt of need to reschedule 08/08/22 appointment - NP out

## 2022-08-08 ENCOUNTER — Ambulatory Visit: Payer: 59 | Admitting: Adult Health

## 2023-01-19 ENCOUNTER — Ambulatory Visit (INDEPENDENT_AMBULATORY_CARE_PROVIDER_SITE_OTHER): Payer: 59 | Admitting: Podiatry

## 2023-01-19 DIAGNOSIS — M79675 Pain in left toe(s): Secondary | ICD-10-CM

## 2023-01-19 DIAGNOSIS — M79674 Pain in right toe(s): Secondary | ICD-10-CM

## 2023-01-19 DIAGNOSIS — B351 Tinea unguium: Secondary | ICD-10-CM | POA: Diagnosis not present

## 2023-01-19 NOTE — Progress Notes (Signed)
   SUBJECTIVE Patient presents to office today complaining of elongated, thickened nails that cause pain while ambulating in shoes.  Patient is unable to trim their own nails. Patient is here for further evaluation and treatment.  Past Medical History:  Diagnosis Date   Anxiety state, unspecified    Arthropathy, unspecified, site unspecified    Benign carcinoid tumor of the duodenum    Blindness of one eye    Breast cancer ([facility]) 2009   Cancer ([facility])    breast   Colon cancer ([facility])    Depressive disorder, not elsewhere classified    Diarrhea    Disease of gallbladder    Esophageal cancer ([facility])    GERD (gastroesophageal reflux disease)    Headache(784.0)    Hyperlipidemia    Hypertension    Kidney stones    OA (osteoarthritis) of knee    right   Personal history of radiation therapy    Syncope and collapse    Unspecified ectopic pregnancy without intrauterine pregnancy     OBJECTIVE General Patient is awake, alert, and oriented x 3 and in no acute distress. Derm Skin is dry and supple bilateral. Negative open lesions or macerations. Remaining integument unremarkable. Nails are tender, long, thickened and dystrophic with subungual debris, consistent with onychomycosis, 1-5 bilateral. No signs of infection noted. Vasc  DP and PT pedal pulses palpable bilaterally. Temperature gradient within normal limits.  Neuro Epicritic and protective threshold sensation grossly intact bilaterally.  Musculoskeletal Exam No symptomatic pedal deformities noted bilateral. Muscular strength within normal limits.  ASSESSMENT 1.  Pain due to onychomycosis of toenails both  PLAN OF CARE 1. Patient evaluated today.  2. Instructed to maintain good pedal hygiene and foot care.  3. Mechanical debridement of nails 1-5 bilaterally performed using a nail nipper. Filed with dremel without incident.  4. Return to clinic in 3 mos.    Brent M. Evans, DPM [facility]  Dr. Brent M. Evans, DPM     [address]                                     [place], [place] [address]                Office [phone]  Fax [phone]    

## 2023-02-21 ENCOUNTER — Other Ambulatory Visit: Payer: Self-pay | Admitting: Neurology

## 2023-05-18 ENCOUNTER — Other Ambulatory Visit: Payer: Self-pay | Admitting: Neurology

## 2023-05-24 ENCOUNTER — Other Ambulatory Visit: Payer: Self-pay | Admitting: Neurology

## 2023-07-22 ENCOUNTER — Other Ambulatory Visit: Payer: Self-pay

## 2023-07-22 ENCOUNTER — Encounter: Payer: Self-pay | Admitting: Emergency Medicine

## 2023-07-22 ENCOUNTER — Emergency Department: Payer: 59

## 2023-07-22 DIAGNOSIS — R519 Headache, unspecified: Secondary | ICD-10-CM | POA: Diagnosis not present

## 2023-07-22 DIAGNOSIS — I77810 Thoracic aortic ectasia: Secondary | ICD-10-CM | POA: Diagnosis not present

## 2023-07-22 DIAGNOSIS — Z85038 Personal history of other malignant neoplasm of large intestine: Secondary | ICD-10-CM | POA: Insufficient documentation

## 2023-07-22 DIAGNOSIS — R55 Syncope and collapse: Principal | ICD-10-CM | POA: Insufficient documentation

## 2023-07-22 DIAGNOSIS — Z79899 Other long term (current) drug therapy: Secondary | ICD-10-CM | POA: Insufficient documentation

## 2023-07-22 DIAGNOSIS — Z515 Encounter for palliative care: Secondary | ICD-10-CM | POA: Diagnosis not present

## 2023-07-22 DIAGNOSIS — Z87820 Personal history of traumatic brain injury: Secondary | ICD-10-CM | POA: Diagnosis not present

## 2023-07-22 DIAGNOSIS — R2689 Other abnormalities of gait and mobility: Secondary | ICD-10-CM | POA: Insufficient documentation

## 2023-07-22 DIAGNOSIS — I10 Essential (primary) hypertension: Secondary | ICD-10-CM | POA: Diagnosis not present

## 2023-07-22 DIAGNOSIS — D649 Anemia, unspecified: Secondary | ICD-10-CM | POA: Diagnosis not present

## 2023-07-22 DIAGNOSIS — Z853 Personal history of malignant neoplasm of breast: Secondary | ICD-10-CM | POA: Diagnosis not present

## 2023-07-22 DIAGNOSIS — F419 Anxiety disorder, unspecified: Secondary | ICD-10-CM | POA: Diagnosis not present

## 2023-07-22 DIAGNOSIS — F0390 Unspecified dementia without behavioral disturbance: Secondary | ICD-10-CM | POA: Diagnosis not present

## 2023-07-22 LAB — CBC
HCT: 34.7 % — ABNORMAL LOW (ref 36.0–46.0)
Hemoglobin: 11.2 g/dL — ABNORMAL LOW (ref 12.0–15.0)
MCH: 30.6 pg (ref 26.0–34.0)
MCHC: 32.3 g/dL (ref 30.0–36.0)
MCV: 94.8 fL (ref 80.0–100.0)
Platelets: 172 10*3/uL (ref 150–400)
RBC: 3.66 MIL/uL — ABNORMAL LOW (ref 3.87–5.11)
RDW: 14.3 % (ref 11.5–15.5)
WBC: 5.4 10*3/uL (ref 4.0–10.5)
nRBC: 0 % (ref 0.0–0.2)

## 2023-07-22 LAB — BASIC METABOLIC PANEL
Anion gap: 11 (ref 5–15)
BUN: 22 mg/dL (ref 8–23)
CO2: 24 mmol/L (ref 22–32)
Calcium: 9.3 mg/dL (ref 8.9–10.3)
Chloride: 104 mmol/L (ref 98–111)
Creatinine, Ser: 1.5 mg/dL — ABNORMAL HIGH (ref 0.44–1.00)
GFR, Estimated: 35 mL/min — ABNORMAL LOW (ref >60)
Glucose, Bld: 153 mg/dL — ABNORMAL HIGH (ref 70–99)
Potassium: 3.5 mmol/L (ref 3.5–5.1)
Sodium: 139 mmol/L (ref 135–145)

## 2023-07-22 NOTE — ED Triage Notes (Signed)
Patient to ED via [facility] from home after a syncopal episode. Granddaughter states she walked to her bedroom stating she wasn't feeling well when she passed out. Pt hit head on hardwood floors. Does not take blood thinners. Hx of dementia. Knot to left side of forehead from fall.

## 2023-07-22 NOTE — ED Provider Triage Note (Signed)
Emergency Medicine Provider Triage Evaluation Note  KEILANA MORLOCK , a 83 y.o. female  was evaluated in triage.  Pt complains of syncopal episode today witnessed by her granddaughter. Endorses hitting her head. Noted hematoma to the left temporal region.   Review of Systems  Positive:  Negative: Chest pain, sob  Physical Exam  BP (!) 149/96 (BP Location: Right Arm)   Pulse 67   Temp 98.5 F (36.9 C) (Oral)   Resp 18   Ht 5' (1.524 m)   Wt 62 kg   SpO2 97%   BMI 26.69 kg/m  Gen:   Awake, no distress   Resp:  Normal effort LCTAB MSK:   Moves extremities without difficulty  Other:  RRR  Medical Decision Making  Medically screening exam initiated at 5:57 PM.  Appropriate orders placed.  Nyaja [place] Kentner was informed that the remainder of the evaluation will be completed by another provider, this initial triage assessment does not replace that evaluation, and the importance of remaining in the ED until their evaluation is complete.    Romeo Apple, Mattye Verdone A, PA-C 07/22/23 1800

## 2023-07-23 ENCOUNTER — Observation Stay
Admission: EM | Admit: 2023-07-23 | Discharge: 2023-07-24 | Disposition: A | Discharge status: Home / Self Care | Payer: 59 | Attending: Internal Medicine | Admitting: Internal Medicine

## 2023-07-23 ENCOUNTER — Observation Stay: Payer: 59

## 2023-07-23 DIAGNOSIS — S069XAA Unspecified intracranial injury with loss of consciousness status unknown, initial encounter: Secondary | ICD-10-CM | POA: Diagnosis present

## 2023-07-23 DIAGNOSIS — Z85038 Personal history of other malignant neoplasm of large intestine: Secondary | ICD-10-CM | POA: Insufficient documentation

## 2023-07-23 DIAGNOSIS — R569 Unspecified convulsions: Secondary | ICD-10-CM

## 2023-07-23 DIAGNOSIS — R55 Syncope and collapse: Principal | ICD-10-CM | POA: Diagnosis present

## 2023-07-23 DIAGNOSIS — Q2544 Congenital dilation of aorta: Secondary | ICD-10-CM

## 2023-07-23 DIAGNOSIS — I10 Essential (primary) hypertension: Secondary | ICD-10-CM | POA: Diagnosis present

## 2023-07-23 DIAGNOSIS — R519 Headache, unspecified: Secondary | ICD-10-CM

## 2023-07-23 DIAGNOSIS — F03918 Unspecified dementia, unspecified severity, with other behavioral disturbance: Secondary | ICD-10-CM | POA: Diagnosis present

## 2023-07-23 DIAGNOSIS — F32A Depression, unspecified: Secondary | ICD-10-CM | POA: Diagnosis present

## 2023-07-23 DIAGNOSIS — Z7189 Other specified counseling: Secondary | ICD-10-CM | POA: Diagnosis not present

## 2023-07-23 DIAGNOSIS — Z853 Personal history of malignant neoplasm of breast: Secondary | ICD-10-CM

## 2023-07-23 DIAGNOSIS — G8929 Other chronic pain: Secondary | ICD-10-CM | POA: Diagnosis present

## 2023-07-23 DIAGNOSIS — D649 Anemia, unspecified: Secondary | ICD-10-CM | POA: Insufficient documentation

## 2023-07-23 LAB — URINALYSIS, ROUTINE W REFLEX MICROSCOPIC
Bacteria, UA: NONE SEEN
Bilirubin Urine: NEGATIVE
Glucose, UA: NEGATIVE mg/dL
Hgb urine dipstick: NEGATIVE
Ketones, ur: NEGATIVE mg/dL
Nitrite: NEGATIVE
Protein, ur: NEGATIVE mg/dL
Specific Gravity, Urine: 1.01 (ref 1.005–1.030)
pH: 6 (ref 5.0–8.0)

## 2023-07-23 LAB — CBG MONITORING, ED
Glucose-Capillary: 113 mg/dL — ABNORMAL HIGH (ref 70–99)
Glucose-Capillary: 116 mg/dL — ABNORMAL HIGH (ref 70–99)

## 2023-07-23 LAB — HEPATIC FUNCTION PANEL
ALT: 32 U/L (ref 0–44)
AST: 40 U/L (ref 15–41)
Albumin: 3.9 g/dL (ref 3.5–5.0)
Alkaline Phosphatase: 72 U/L (ref 38–126)
Bilirubin, Direct: 0.3 mg/dL — ABNORMAL HIGH (ref 0.0–0.2)
Indirect Bilirubin: 0.5 mg/dL (ref 0.3–0.9)
Total Bilirubin: 0.8 mg/dL (ref 0.0–1.2)
Total Protein: 7.3 g/dL (ref 6.5–8.1)

## 2023-07-23 LAB — LIPASE, BLOOD: Lipase: 26 U/L (ref 11–51)

## 2023-07-23 LAB — TROPONIN I (HIGH SENSITIVITY)
Troponin I (High Sensitivity): 10 ng/L (ref <18)
Troponin I (High Sensitivity): 7 ng/L (ref <18)

## 2023-07-23 LAB — MAGNESIUM: Magnesium: 2.4 mg/dL (ref 1.7–2.4)

## 2023-07-23 MED ORDER — SODIUM CHLORIDE 0.9% FLUSH
3.0000 mL | Freq: Two times a day (BID) | INTRAVENOUS | Status: DC
  Administered 2023-07-23 – 2023-07-24 (×3): 3 mL via INTRAVENOUS

## 2023-07-23 MED ORDER — DORZOLAMIDE HCL-TIMOLOL MAL 2-0.5 % OP SOLN
1.0000 [drp] | Freq: Two times a day (BID) | OPHTHALMIC | Status: DC
  Administered 2023-07-23 – 2023-07-24 (×2): 1 [drp] via OPHTHALMIC
  Filled 2023-07-23: qty 10

## 2023-07-23 MED ORDER — LATANOPROST 0.005 % OP SOLN
1.0000 [drp] | Freq: Every day | OPHTHALMIC | Status: DC
  Administered 2023-07-23: 1 [drp] via OPHTHALMIC
  Filled 2023-07-23: qty 2.5

## 2023-07-23 MED ORDER — ONDANSETRON HCL 4 MG/2ML IJ SOLN
4.0000 mg | Freq: Once | INTRAMUSCULAR | Status: AC
  Administered 2023-07-23: 4 mg via INTRAVENOUS
  Filled 2023-07-23: qty 2

## 2023-07-23 MED ORDER — DIPHENHYDRAMINE HCL 50 MG/ML IJ SOLN: 50.0000 mg | Freq: Once | INTRAMUSCULAR | Status: AC

## 2023-07-23 MED ORDER — BRIMONIDINE TARTRATE 0.2 % OP SOLN
1.0000 [drp] | Freq: Two times a day (BID) | OPHTHALMIC | Status: DC
  Administered 2023-07-23 – 2023-07-24 (×2): 1 [drp] via OPHTHALMIC
  Filled 2023-07-23: qty 5

## 2023-07-23 MED ORDER — HYDROCODONE-ACETAMINOPHEN 5-325 MG PO TABS: 1.0000 | ORAL_TABLET | ORAL | Status: DC | PRN

## 2023-07-23 MED ORDER — METHYLPREDNISOLONE SODIUM SUCC 40 MG IJ SOLR
40.0000 mg | Freq: Once | INTRAMUSCULAR | Status: AC
  Administered 2023-07-23: 40 mg via INTRAVENOUS
  Filled 2023-07-23: qty 1

## 2023-07-23 MED ORDER — DIPHENHYDRAMINE HCL 25 MG PO CAPS
50.0000 mg | ORAL_CAPSULE | Freq: Once | ORAL | Status: AC
  Administered 2023-07-23: 50 mg via ORAL
  Filled 2023-07-23: qty 2

## 2023-07-23 MED ORDER — MEMANTINE HCL 5 MG PO TABS
10.0000 mg | ORAL_TABLET | Freq: Two times a day (BID) | ORAL | Status: DC
  Administered 2023-07-23 – 2023-07-24 (×3): 10 mg via ORAL
  Filled 2023-07-23 (×3): qty 2

## 2023-07-23 MED ORDER — MORPHINE SULFATE (PF) 2 MG/ML IV SOLN: 1.0000 mg | INTRAVENOUS | Status: DC | PRN

## 2023-07-23 MED ORDER — ONDANSETRON HCL 4 MG PO TABS: 4.0000 mg | ORAL_TABLET | Freq: Four times a day (QID) | ORAL | Status: DC | PRN

## 2023-07-23 MED ORDER — ACETAMINOPHEN 325 MG PO TABS
650.0000 mg | ORAL_TABLET | Freq: Four times a day (QID) | ORAL | Status: DC | PRN
  Administered 2023-07-23: 650 mg via ORAL
  Filled 2023-07-23: qty 2

## 2023-07-23 MED ORDER — AMLODIPINE BESYLATE 5 MG PO TABS
10.0000 mg | ORAL_TABLET | Freq: Every day | ORAL | Status: DC
  Administered 2023-07-23 – 2023-07-24 (×2): 10 mg via ORAL
  Filled 2023-07-23 (×2): qty 2

## 2023-07-23 MED ORDER — ACETAMINOPHEN 325 MG RE SUPP: 650.0000 mg | Freq: Four times a day (QID) | RECTAL | Status: DC | PRN

## 2023-07-23 MED ORDER — ONDANSETRON HCL 4 MG/2ML IJ SOLN: 4.0000 mg | Freq: Four times a day (QID) | INTRAMUSCULAR | Status: DC | PRN

## 2023-07-23 MED ORDER — SODIUM CHLORIDE 0.9 % IV BOLUS (SEPSIS)
1000.0000 mL | Freq: Once | INTRAVENOUS | Status: AC
  Administered 2023-07-23: 1000 mL via INTRAVENOUS

## 2023-07-23 MED ORDER — ACETAMINOPHEN 500 MG PO TABS
1000.0000 mg | ORAL_TABLET | Freq: Once | ORAL | Status: AC
  Administered 2023-07-23: 1000 mg via ORAL
  Filled 2023-07-23: qty 2

## 2023-07-23 MED ORDER — IOHEXOL 350 MG/ML SOLN
100.0000 mL | Freq: Once | INTRAVENOUS | Status: AC | PRN
Start: 2023-07-23 — End: 2023-07-23
  Administered 2023-07-23: 100 mL via INTRAVENOUS

## 2023-07-23 NOTE — ED Notes (Signed)
 CCMD called and pt placed on cardiac monitoring.

## 2023-07-23 NOTE — Assessment & Plan Note (Signed)
History of colon cancer No acute issues suspected Getting D-dimer to evaluate for possibility of PE.  CTA chest if elevated

## 2023-07-23 NOTE — ED Notes (Signed)
Patient ambulated to toilet in room @8  am and urinated X1

## 2023-07-23 NOTE — Assessment & Plan Note (Addendum)
Follow-up CTA aorta Follow-up echocardiogram

## 2023-07-23 NOTE — ED Notes (Signed)
Pt now reporting a minor headache and is asking for Tylenol.

## 2023-07-23 NOTE — Assessment & Plan Note (Signed)
Delirium precautions Continue memantine

## 2023-07-23 NOTE — Consult Note (Signed)
Cardiology Consultation:   Patient ID: [name]; [id number]; [birth year]   Admit date: 07/23/2023 Date of Consult: 07/23/2023  Primary Care Provider: Hillery Aldo, MD Primary Cardiologist: Mariah Milling Primary Electrophysiologist:  None   Patient Profile:   [name] is a 83 y.o. female with a hx of recurrent syncope, vertigo, ascending aortic aneurysm measuring 4.2 cm, breast and colon cancer, HTN, HLD who is being seen today for the evaluation of recurrent syncope at the request of Dr. Sherryll Burger, MD.  History of Present Illness:   [name] has a history of multiple syncopal episodes.  In 2011 and 2013 she had syncopal episodes while working with physical therapy with associated hypotension each time with BP ranging from 90-77 systolic.  She had a syncopal episode while at the ophthalmologist in the setting of acute abdominal pain.  She was found to be bradycardic in the 40s and hypotensive at that time.  She also had a near syncopal versus syncopal episode while riding in a car, as the passenger.  Lexiscan MPI in 2011 showed breast attenuation artifact in the anterior region with inability to definitively exclude mild ischemia.  Prior event monitor in 2012 showed sinus rhythm with PACs.  Echo from 2022 showed an EF of 50 to 55%, no regional wall motion abnormalities, moderate LVH, grade 1 diastolic dysfunction, normal RV systolic function and ventricular cavity size, mild mitral regurgitation, and mild to moderate aortic valve insufficiency.  She presented to [facility] ED in the evening hours of 07/22/2023 after suffering a syncopal episode.  Episode was witnessed by the patient's granddaughter.  Patient was in her usual state of health and had walked to her granddaughter's room to let her know if she was going to the restroom.  While standing in the doorway the patient's eyes then rolled in the back of her head and the patient suffered LOC, striking the left side of her face on the wall.  The  patient's granddaughter indicated the patient was minimally to unresponsive for approximately 5 to 10 minutes.  The patient had a loss of bowel control.  No seizure-like activity.  The patient was cold and clammy during this episode.  The patient reports that she was without symptoms of chest pain, dyspnea, palpitations, or dizziness leading up to, or after the syncopal episode.  In the ED, she remained hemodynamically stable.  CT of the head and cervical spine showed no acute intracranial abnormality with CT cervical spine showing no acute fracture or listhesis.  CTA of the chest, abdomen, pelvis showed no acute finding with a stable ascending aortic aneurysm measuring 4.2 cm.  Large hiatal hernia containing stomach and transverse colon was also noted.  High-sensitivity troponin negative x 2.  Serum creatinine 1.5 with baseline around 1.0-1.2.  Hgb 11.2 with baseline around 12.  Telemetry has demonstrated sinus rhythm with a brief episode of A-fib with RVR at 07:36 this morning lasting for less than 1 minute without significant posttermination pause.  No evidence of high-grade AV block.  Currently without symptoms of angina or cardiac decompensation.  Continues to note some weakness.    Past Medical History:  Diagnosis Date   Anxiety state, unspecified    Arthropathy, unspecified, site unspecified    Benign carcinoid tumor of the duodenum    Blindness of one eye    Breast cancer (HCC) 2009   Cancer [facility])    breast   Colon cancer (HCC)    Depressive disorder, not elsewhere classified    Diarrhea  Disease of gallbladder    Esophageal cancer (HCC)    GERD (gastroesophageal reflux disease)    Headache(784.0)    Hyperlipidemia    Hypertension    Kidney stones    OA (osteoarthritis) of knee    right   Personal history of radiation therapy    Syncope and collapse    Unspecified ectopic pregnancy without intrauterine pregnancy     Past Surgical History:  Procedure Laterality Date   BREAST  BIOPSY Bilateral    BREAST EXCISIONAL BIOPSY Right 2009   (+)   BREAST SURGERY Right    Cataract surgery Left 10/11/2017   Implant   CHOLECYSTECTOMY  05-27-12   COLON RESECTION     COLONOSCOPY     PARTIAL HYSTERECTOMY     TOTAL KNEE ARTHROPLASTY Right      Home Meds: Prior to Admission medications   Medication Sig Start Date End Date Taking? Authorizing Provider  alendronate (FOSAMAX) 70 MG tablet alendronate 70 mg tablet  TAKE 1 TABLET BY MOUTH ONCE A WEEK   Yes [provider]  amLODipine (NORVASC) 10 MG tablet Take 10 mg by mouth daily.   Yes [provider]  aspirin 81 MG EC tablet Take 81 mg by mouth daily.    Yes [provider]  brimonidine (ALPHAGAN) 0.2 % ophthalmic solution Place 1 drop into both eyes 2 (two) times daily.   Yes [provider]  dorzolamide-timolol (COSOPT) 2-0.5 % ophthalmic solution Place 1 drop into both eyes 2 (two) times daily. 07/11/23  Yes [provider]  famotidine (PEPCID) 20 MG tablet Take 1 tablet (20 mg total) by mouth daily. 10/21/20 07/23/23 Yes Shaune Pollack, MD  latanoprost (XALATAN) 0.005 % ophthalmic solution Place 1 drop into both eyes at bedtime.   Yes [provider]  memantine (NAMENDA) 10 MG tablet TAKE 1 TABLET BY MOUTH TWICE DAILY.  PATIENT NEEDS AN OFFICE VISIT FOR FUTURE RX REFILLS 05/24/23  Yes Anson Fret, MD  sertraline (ZOLOFT) 100 MG tablet Take 200 mg by mouth daily.   Yes [provider]  dorzolamide (TRUSOPT) 2 % ophthalmic solution Place 1 drop into both eyes 2 (two) times daily. Patient not taking: Reported on 07/23/2023    [provider]  timolol (TIMOPTIC) 0.5 % ophthalmic solution timolol maleate 0.5 % eye drops Patient not taking: Reported on 07/23/2023    [provider]    Inpatient Medications: Scheduled Meds:  amLODipine  10 mg Oral Daily   memantine  10 mg Oral BID   sodium chloride flush  3 mL Intravenous Q12H   Continuous  Infusions:  PRN Meds: acetaminophen **OR** acetaminophen, HYDROcodone-acetaminophen, morphine injection, ondansetron **OR** ondansetron (ZOFRAN) IV  Allergies:   Allergies  Allergen Reactions   Iodinated Contrast Media Hives   Morphine Other (See Comments)    confusion  Other Reaction(s): confusion   Iohexol Hives     Code: HIVES, Desc: PTS GRANDDAUGHTER [name] CALLED 12/31/08 TO NOTIFY us THAT HER GRANDMOTHER HAD A REACTION TO THE IV DYE 8 HRS POST CT EXAM PT DEVELOPED HIVES/RASH.12/31/08/RM, Onset Date: [id number]     Social History:   Social History   Socioeconomic History   Marital status: Widowed    Spouse name: Not on file   Number of children: Not on file   Years of education: Not on file   Highest education level: 6th grade  Occupational History   Occupation: Retired  Tobacco Use   Smoking status: Never   Smokeless tobacco: Never  Vaping Use   Vaping status: Never Used  Substance and Sexual Activity   Alcohol use: No   Drug use: No   Sexual activity: Not on file  Other Topics Concern   Not on file  Social History Narrative   Lives alone   Caffeine use: <1 cup coffee/day, 2-3 pepsi per day   Social Drivers of Health   Financial Resource Strain: Not on file  Food Insecurity: Not on file  Transportation Needs: Not on file  Physical Activity: Not on file  Stress: Not on file  Social Connections: Not on file  Intimate Partner Violence: Not on file     Family History:   Family History  Problem Relation Age of Onset   Cancer Mother        unknown type   Cancer Father    Heart attack Brother    Cancer Paternal Grandmother    Diabetes Daughter    Breast cancer Neg Hx    Alzheimer's disease Neg Hx     ROS:  Review of Systems  Constitutional:  Positive for malaise/fatigue. Negative for chills, diaphoresis, fever and weight loss.  HENT:  Negative for congestion.   Eyes:  Negative for discharge and redness.  Respiratory:  Negative for cough, sputum  production, shortness of breath and wheezing.   Cardiovascular:  Negative for chest pain, palpitations, orthopnea, claudication, leg swelling and PND.  Gastrointestinal:  Negative for abdominal pain, heartburn, nausea and vomiting.  Musculoskeletal:  Negative for falls and myalgias.  Skin:  Negative for rash.  Neurological:  Positive for loss of consciousness and weakness. Negative for dizziness, tingling, tremors, sensory change, speech change and focal weakness.  Endo/Heme/Allergies:  Does not bruise/bleed easily.  Psychiatric/Behavioral:  Negative for substance abuse. The patient is not nervous/anxious.   All other systems reviewed and are negative.     Physical Exam/Data:   Vitals:   07/23/23 0300 07/23/23 0400 07/23/23 0515 07/23/23 0830  BP: (!) 164/99 (!) 140/86 (!) 154/97 (!) 126/96  Pulse: 64 62 67 77  Resp: 17 17 17 15   Temp:  98.2 F (36.8 C)    TempSrc:  Oral    SpO2: 97% 96% 100% 100%  Weight:      Height:       No intake or output data in the 24 hours ending 07/23/23 1102 Filed Weights   07/22/23 1755  Weight: 62 kg   Body mass index is 26.69 kg/m.   Physical Exam: General: Elderly and frail-appearing, in no acute distress. Head: Normocephalic, contusion noted along the left lateral orbit, sclera non-icteric, no xanthomas, nares without discharge.  Neck: Negative for carotid bruits. JVD not elevated. Lungs: Clear bilaterally to auscultation without wheezes, rales, or rhonchi. Breathing is unlabored. Heart: RRR with S1 S2. I/VI systolic murmur LSB, no rubs, or gallops appreciated. Abdomen: Soft, non-tender, non-distended with normoactive bowel sounds. No hepatomegaly. No rebound/guarding. No obvious abdominal masses. Msk:  Strength and tone appear normal for age. Extremities: No clubbing or cyanosis. No edema. Distal pedal pulses are 2+ and equal bilaterally. Neuro: Alert and oriented X 3. No facial asymmetry. No focal deficit. Moves all extremities  spontaneously. Psych:  Responds to questions appropriately with a normal affect.   EKG:  The EKG was personally reviewed and demonstrates: NSR, 76 bpm, left axis deviation, rare PACs, LVH, poor R wave progression along the precordial leads, baseline wandering and artifact, nonspecific ST-T changes Telemetry:  Telemetry was personally reviewed and demonstrates: Sinus rhythm with brief episode of  A-fib with RVR lasting less than 1 minute at 7:36 AM on 2/17 without significant post termination pause, rare PVCs  Weights: Filed Weights   07/22/23 1755  Weight: 62 kg    Relevant CV Studies:  2D echo 12/30/2020: 1. Left ventricular ejection fraction, by estimation, is 50 to 55%. The  left ventricle has low normal function. The left ventricle has no regional  wall motion abnormalities. There is moderate left ventricular hypertrophy.  Left ventricular diastolic  parameters are consistent with Grade I diastolic dysfunction (impaired  relaxation).   2. Right ventricular systolic function is normal. The right ventricular  size is normal.   3. The mitral valve is normal in structure. Mild mitral valve  regurgitation. No evidence of mitral stenosis.   4. The aortic valve was not well visualized. Aortic valve regurgitation  is mild to moderate.   Laboratory Data:  Chemistry Recent Labs  Lab 07/22/23 1758  NA 139  K 3.5  CL 104  CO2 24  GLUCOSE 153*  BUN 22  CREATININE 1.50*  CALCIUM 9.3  GFRNONAA 35*  ANIONGAP 11    Recent Labs  Lab 07/23/23 0129  PROT 7.3  ALBUMIN 3.9  AST 40  ALT 32  ALKPHOS 72  BILITOT 0.8   Hematology Recent Labs  Lab 07/22/23 1758  WBC 5.4  RBC 3.66*  HGB 11.2*  HCT 34.7*  MCV 94.8  MCH 30.6  MCHC 32.3  RDW 14.3  PLT 172   Cardiac EnzymesNo results for input(s): "TROPONINI" in the last 168 hours. No results for input(s): "TROPIPOC" in the last 168 hours.  BNPNo results for input(s): "BNP", "PROBNP" in the last 168 hours.  DDimer No results  for input(s): "DDIMER" in the last 168 hours.  Radiology/Studies:  CT Angio Chest/Abd/Pel for Dissection W and/or Wo Contrast Result Date: 07/23/2023 IMPRESSION: 1. No acute finding. 2. 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 3. Large hiatal hernia containing stomach and transverse colon. Electronically Signed   By: Tiburcio Pea M.D.   On: 07/23/2023 06:56   CT Maxillofacial Wo Contrast Result Date: 07/23/2023 IMPRESSION: Negative for facial fracture. Electronically Signed   By: Tiburcio Pea M.D.   On: 07/23/2023 06:39   CT Head Wo Contrast Result Date: 07/22/2023 IMPRESSION: 1. No acute intracranial abnormality. No calvarial fracture. Moderate left frontal scalp soft tissue swelling extending inferiorly superficial to the lateral orbital wall. 2. No acute fracture or listhesis of the cervical spine. 3. 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 4. 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   On: 07/22/2023 22:22   CT Cervical Spine Wo Contrast Result Date: 07/22/2023 IMPRESSION: 1. No acute intracranial abnormality. No calvarial fracture. Moderate left frontal scalp soft tissue swelling extending inferiorly superficial to the lateral orbital wall. 2. No acute fracture or listhesis of the cervical spine. 3. 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 4. 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   On: 07/22/2023 22:22    Assessment and Plan:   1.  Recurrent syncope: -Uncertain etiology at this time -Noted to have mild AKI upon presentation -Telemetry notable for sinus rhythm with brief episode of A-fib with  RVR lasting less than 1 minute without significant  post termination pause (in the context of advanced age, cognitive decline, and brief episode of A-fib, will defer initiation of anticoagulation at this time with recommendation to revisit prior to discharge and then follow-up with primary cardiologist) -Query if she was postictal following this event based on history -Obtain echo -MRI of the brain and EEG pending -Monitor on telemetry -Outpatient cardiac monitoring -D-dimer pending -She does not drive  2.  Ascending thoracic aortic aneurysm: -Stable, measuring 4.2 cm -Outpatient follow-up  3.  AKI: -Hydration      For questions or updates, please contact [facility] Please consult [email or web address] for contact info under Cardiology/STEMI.   Signed, Eula Listen, PA-C [facility] Pager: [phone] 07/23/2023, 11:02 AM

## 2023-07-23 NOTE — ED Provider Notes (Signed)
[facility] Provider Note    Event Date/Time   First MD Initiated Contact with Patient 07/23/23 [phone]     (approximate)   History   Loss of Consciousness   HPI  [name] is a 83 y.o. female with history of breast cancer, colon cancer, esophageal cancer, hypertension, hyperlipidemia, dementia who presents to the emergency department with her granddaughter after she had a syncopal event.  Granddaughter reports that the patient was in her normal state of health tonight when she came up to the patient and told her that she was not feeling well and while standing had a syncopal event and hit her head.  Family reports she was incontinent of stool.  She did have dry heaving afterwards.  No recent fevers, cough.  Has not been complaining of chest pain or shortness of breath.  She is not on blood thinners.  Ambulates normally with a walker.  Granddaughter denies any seizure activity but states patient was unconscious or not completely alert for about 5 to 10 minutes.   History provided by patient, granddaughter.    Past Medical History:  Diagnosis Date   Anxiety state, unspecified    Arthropathy, unspecified, site unspecified    Benign carcinoid tumor of the duodenum    Blindness of one eye    Breast cancer (HCC) 2009   Cancer [facility])    breast   Colon cancer (HCC)    Depressive disorder, not elsewhere classified    Diarrhea    Disease of gallbladder    Esophageal cancer (HCC)    GERD (gastroesophageal reflux disease)    Headache(784.0)    Hyperlipidemia    Hypertension    Kidney stones    OA (osteoarthritis) of knee    right   Personal history of radiation therapy    Syncope and collapse    Unspecified ectopic pregnancy without intrauterine pregnancy     Past Surgical History:  Procedure Laterality Date   BREAST BIOPSY Bilateral    BREAST EXCISIONAL BIOPSY Right 2009   (+)   BREAST SURGERY Right    Cataract surgery Left 10/11/2017    Implant   CHOLECYSTECTOMY  05-27-12   COLON RESECTION     COLONOSCOPY     PARTIAL HYSTERECTOMY     TOTAL KNEE ARTHROPLASTY Right     MEDICATIONS:  Prior to Admission medications   Medication Sig Start Date End Date Taking? Authorizing Provider  alendronate (FOSAMAX) 70 MG tablet alendronate 70 mg tablet  TAKE 1 TABLET BY MOUTH ONCE A WEEK    [provider]  amLODipine (NORVASC) 10 MG tablet Take 10 mg by mouth daily.    [provider]  aspirin 81 MG EC tablet Take 81 mg by mouth daily.     [provider]  brimonidine (ALPHAGAN) 0.2 % ophthalmic solution brimonidine 0.2 % eye drops    [provider]  dorzolamide (TRUSOPT) 2 % ophthalmic solution Place 1 drop into both eyes 2 (two) times daily.    [provider]  famotidine (PEPCID) 20 MG tablet Take 1 tablet (20 mg total) by mouth daily. 10/21/20 11/30/20  Shaune Pollack, MD  latanoprost (XALATAN) 0.005 % ophthalmic solution Place 1 drop into both eyes at bedtime.    [provider]  memantine (NAMENDA) 10 MG tablet TAKE 1 TABLET BY MOUTH TWICE DAILY.  PATIENT NEEDS AN OFFICE VISIT FOR FUTURE RX REFILLS 05/24/23   Anson Fret, MD  sertraline (ZOLOFT) 100 MG tablet Take  150 mg by mouth daily.    [provider]  timolol (TIMOPTIC) 0.5 % ophthalmic solution timolol maleate 0.5 % eye drops    [provider]    Physical Exam   Triage Vital Signs: ED Triage Vitals  Encounter Vitals Group     BP 07/22/23 1753 (!) 149/96     Systolic BP Percentile --      Diastolic BP Percentile --      Pulse Rate 07/22/23 1753 67     Resp 07/22/23 1753 18     Temp 07/22/23 1753 98.5 F (36.9 C)     Temp Source 07/22/23 1753 Oral     SpO2 07/22/23 1753 97 %     Weight 07/22/23 1755 136 lb 11 oz (62 kg)     Height 07/22/23 1755 5' (1.524 m)     Head Circumference --      Peak Flow --      Pain Score 07/22/23 1754 7     Pain Loc --      Pain Education --      Exclude  from Growth Chart --     Most recent vital signs: Vitals:   07/23/23 0400 07/23/23 0515  BP: (!) 140/86 (!) 154/97  Pulse: 62 67  Resp: 17 17  Temp: 98.2 F (36.8 C)   SpO2: 96% 100%    CONSTITUTIONAL: Alert, responds appropriately to questions. Well-appearing; well-nourished HEAD: Normocephalic, bruising noted to the left side of the face EYES: Conjunctivae clear, pupils appear equal, sclera nonicteric ENT: normal nose; moist mucous membranes, tenderness over the left side of the face, no malocclusion, normal phonation, no dental injury NECK: Supple, normal ROM, no midline spinal tenderness or step-off or deformity CARD: RRR; S1 and S2 appreciated RESP: Normal chest excursion without splinting or tachypnea; breath sounds clear and equal bilaterally; no wheezes, no rhonchi, no rales, no hypoxia or respiratory distress, speaking full sentences ABD/GI: Non-distended; soft, non-tender, no rebound, no guarding, no peritoneal signs BACK: The back appears normal, no midline spinal tenderness or step-off or deformity EXT: Normal ROM in all joints; no deformity noted, no edema SKIN: Normal color for age and race; warm; no rash on exposed skin NEURO: Moves all extremities equally, normal speech, no facial asymmetry, unable to reliably test sensation PSYCH: The patient's mood and manner are appropriate.   ED Results / Procedures / Treatments   LABS: (all labs ordered are listed, but only abnormal results are displayed) Labs Reviewed  BASIC METABOLIC PANEL - Abnormal; Notable for the following components:      Result Value   Glucose, Bld 153 (*)    Creatinine, Ser 1.50 (*)    GFR, Estimated 35 (*)    All other components within normal limits  CBC - Abnormal; Notable for the following components:   RBC 3.66 (*)    Hemoglobin 11.2 (*)    HCT 34.7 (*)    All other components within normal limits  URINALYSIS, ROUTINE W REFLEX MICROSCOPIC - Abnormal; Notable for the following components:    Color, Urine YELLOW (*)    APPearance CLEAR (*)    Leukocytes,Ua TRACE (*)    All other components within normal limits  HEPATIC FUNCTION PANEL - Abnormal; Notable for the following components:   Bilirubin, Direct 0.3 (*)    All other components within normal limits  CBG MONITORING, ED - Abnormal; Notable for the following components:   Glucose-Capillary 113 (*)    All other components within normal limits  MAGNESIUM  LIPASE, BLOOD  D-DIMER, QUANTITATIVE  TROPONIN I (HIGH SENSITIVITY)  TROPONIN I (HIGH SENSITIVITY)     EKG:  EKG Interpretation Date/Time:  Sunday July 22 2023 18:01:19 EST Ventricular Rate:  76 PR Interval:  190 QRS Duration:  78 QT Interval:  426 QTC Calculation: 479 R Axis:   -30  Text Interpretation: Sinus rhythm with Premature supraventricular complexes Left axis deviation Moderate voltage criteria for LVH, may be normal variant ( R in aVL , [employer] product ) Possible Lateral infarct (cited on or before 21-Oct-2020) Abnormal ECG When compared with ECG of 21-Oct-2020 12:29, Premature supraventricular complexes are now Present QT has lengthened Confirmed by Rochele Raring [phone]) on 07/23/2023 1:15:49 AM         RADIOLOGY: My personal review and interpretation of imaging: CT scan shows stable aneurysm without rupture, dissection.  CT head, neck, face show no traumatic injury.  I have personally reviewed all radiology reports.   CT Angio Chest/Abd/Pel for Dissection W and/or Wo Contrast Result Date: 07/23/2023 CLINICAL DATA:  Aortic aneurysm suspected, syncope. EXAM: CT ANGIOGRAPHY CHEST, ABDOMEN AND PELVIS TECHNIQUE: Non-contrast CT of the chest was initially obtained. Multidetector CT imaging through the chest, abdomen and pelvis was performed using the standard protocol during bolus administration of intravenous contrast. Multiplanar reconstructed images and MIPs were obtained and reviewed to evaluate the vascular anatomy. RADIATION DOSE REDUCTION: This  exam was performed according to the departmental dose-optimization program which includes automated exposure control, adjustment of the mA and/or kV according to patient size and/or use of iterative reconstruction technique. CONTRAST:  OMNIPAQUE IOHEXOL 350 MG/ML SOLN COMPARISON:  Abdominal CT 05/14/2019 FINDINGS: CTA CHEST FINDINGS Cardiovascular: Preferential opacification of the thoracic aorta. No evidence of thoracic aortic aneurysm or dissection. Normal heart size. No pericardial effusion. The aorta and great vessels are tortuous any long gated. The aorta measures up to 4.2 cm in diameter at the ascending segment and 3.5 cm at the level just beyond the isthmus. Mediastinum/Nodes: Large hiatal hernia containing stomach and transverse colon. No mass or adenopathy. Lungs/Pleura: The central airways are clear. Scarring in the left lower lobe adjacent to the large hiatal hernia. Mild cylindrical bronchiectasis in the right lower lobe. Musculoskeletal: Generalized degeneration with thoracolumbar scoliosis. No acute or aggressive finding Review of the MIP images confirms the above findings. CTA ABDOMEN AND PELVIS FINDINGS VASCULAR Aorta: Atheromatous plaque. No dissection, inflammation, or aneurysmal dilatation. Celiac: Broad celiac at its bifurcation. No branch occlusion or flow reducing stenosis SMA: Diffusely patent.  Undulating lumen without definite beading. Renals: Calcified plaque at the ostia without significant stenosis. No beading, dissection, aneurysm, or proximal stenosis. IMA: Patent Inflow: Atheromatous plaque Veins: Unremarkable Review of the MIP images confirms the above findings. NON-VASCULAR Hepatobiliary: No focal liver abnormality.Cholecystectomy which likely accounts for chronic biliary dilatation. Pancreas: Generalized atrophy.  No acute or focal finding Spleen: Unremarkable. Adrenals/Urinary Tract: Negative adrenals. No hydronephrosis or ureteral stone. Bilateral renal cystic densities.  Calcification which could be vascular or urinary at the left hilum measuring 3 mm. Unremarkable bladder. Stomach/Bowel: Proximal colectomy with anastomosis that is unremarkable. Lymphatic: No mass or adenopathy. Reproductive:No pathologic findings. Other: No ascites or pneumoperitoneum. Musculoskeletal: No acute abnormalities. Generalized degeneration with scoliosis. Review of the MIP images confirms the above findings. IMPRESSION: 1. No acute finding. 2. 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 3. Large hiatal hernia containing stomach and transverse colon. Electronically Signed   By: Tiburcio Pea M.D.   On: 07/23/2023 06:56  CT Maxillofacial Wo Contrast Result Date: 07/23/2023 CLINICAL DATA:  Blunt facial trauma. Syncopal episode with head injury EXAM: CT MAXILLOFACIAL WITHOUT CONTRAST TECHNIQUE: Multidetector CT imaging of the maxillofacial structures was performed. Multiplanar CT image reconstructions were also generated. RADIATION DOSE REDUCTION: This exam was performed according to the departmental dose-optimization program which includes automated exposure control, adjustment of the mA and/or kV according to patient size and/or use of iterative reconstruction technique. COMPARISON:  03/24/2022 FINDINGS: Osseous: No fracture or mandibular dislocation. No destructive process. Orbits: Negative. No traumatic or inflammatory finding. Sinuses: Negative for hemosinus. Soft tissues: Scalp swelling on the left.  No opaque foreign body. Limited intracranial: Preceding head CT, no evidence of injury. IMPRESSION: Negative for facial fracture. Electronically Signed   By: Tiburcio Pea M.D.   On: 07/23/2023 06:39   CT Head Wo Contrast Result Date: 07/22/2023 CLINICAL DATA:  Head trauma, minor (Age >= 65y); Neck trauma (Age >= 65y), syncope, fall EXAM: CT HEAD WITHOUT CONTRAST CT CERVICAL SPINE WITHOUT CONTRAST TECHNIQUE: Multidetector CT imaging of the head and cervical spine was  performed following the standard protocol without intravenous contrast. Multiplanar CT image reconstructions of the cervical spine were also generated. RADIATION DOSE REDUCTION: This exam was performed according to the departmental dose-optimization program which includes automated exposure control, adjustment of the mA and/or kV according to patient size and/or use of iterative reconstruction technique. COMPARISON:  None Available. FINDINGS: CT HEAD FINDINGS Brain: Normal anatomic configuration. No abnormal intra or extra-axial mass lesion or fluid collection. No abnormal mass effect or midline shift. No evidence of acute intracranial hemorrhage or infarct. Ventricular size is normal. Cerebellum unremarkable. Vascular: Unremarkable Skull: Intact Sinuses/Orbits: Paranasal sinuses are clear. Orbits are unremarkable. Other: Mastoid air cells and middle ear cavities are clear. Moderate left frontal scalp soft tissue swelling extending inferiorly superficial to the lateral orbital wall. CT CERVICAL SPINE FINDINGS Alignment: Normal. Skull base and vertebrae: Craniocervical alignment is normal. The atlantodental interval is not widened. No acute fracture of the cervical spine. Vertebral body height is preserved. Soft tissues and spinal canal: No prevertebral fluid or swelling. No visible canal hematoma. Disc levels: There is intervertebral disc space narrowing and endplate remodeling throughout the cervical spine in keeping with changes advanced degenerative disc disease. Prevertebral soft tissues are not thickened on sagittal reformats. The spinal canal is widely patent. Multilevel uncovertebral arthrosis and to a lesser extent facet arthrosis in combination with disc height loss results in severe bilateral neuroforaminal narrowing at C3-C6 with moderate narrowing noted at C6-7 bilaterally. Upper chest: The aortic arch is dilated measuring 4.2 cm in diameter, similar to prior examination of 08/16/2016. No acute  abnormality Other: None IMPRESSION: 1. No acute intracranial abnormality. No calvarial fracture. Moderate left frontal scalp soft tissue swelling extending inferiorly superficial to the lateral orbital wall. 2. No acute fracture or listhesis of the cervical spine. 3. 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 4. 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   On: 07/22/2023 22:22   CT Cervical Spine Wo Contrast Result Date: 07/22/2023 CLINICAL DATA:  Head trauma, minor (Age >= 65y); Neck trauma (Age >= 65y), syncope, fall EXAM: CT HEAD WITHOUT CONTRAST CT CERVICAL SPINE WITHOUT CONTRAST TECHNIQUE: Multidetector CT imaging of the head and cervical spine was performed following the standard protocol without intravenous contrast. Multiplanar CT image reconstructions of the cervical spine were also generated.  RADIATION DOSE REDUCTION: This exam was performed according to the departmental dose-optimization program which includes automated exposure control, adjustment of the mA and/or kV according to patient size and/or use of iterative reconstruction technique. COMPARISON:  None Available. FINDINGS: CT HEAD FINDINGS Brain: Normal anatomic configuration. No abnormal intra or extra-axial mass lesion or fluid collection. No abnormal mass effect or midline shift. No evidence of acute intracranial hemorrhage or infarct. Ventricular size is normal. Cerebellum unremarkable. Vascular: Unremarkable Skull: Intact Sinuses/Orbits: Paranasal sinuses are clear. Orbits are unremarkable. Other: Mastoid air cells and middle ear cavities are clear. Moderate left frontal scalp soft tissue swelling extending inferiorly superficial to the lateral orbital wall. CT CERVICAL SPINE FINDINGS Alignment: Normal. Skull base and vertebrae: Craniocervical alignment is normal.  The atlantodental interval is not widened. No acute fracture of the cervical spine. Vertebral body height is preserved. Soft tissues and spinal canal: No prevertebral fluid or swelling. No visible canal hematoma. Disc levels: There is intervertebral disc space narrowing and endplate remodeling throughout the cervical spine in keeping with changes advanced degenerative disc disease. Prevertebral soft tissues are not thickened on sagittal reformats. The spinal canal is widely patent. Multilevel uncovertebral arthrosis and to a lesser extent facet arthrosis in combination with disc height loss results in severe bilateral neuroforaminal narrowing at C3-C6 with moderate narrowing noted at C6-7 bilaterally. Upper chest: The aortic arch is dilated measuring 4.2 cm in diameter, similar to prior examination of 08/16/2016. No acute abnormality Other: None IMPRESSION: 1. No acute intracranial abnormality. No calvarial fracture. Moderate left frontal scalp soft tissue swelling extending inferiorly superficial to the lateral orbital wall. 2. No acute fracture or listhesis of the cervical spine. 3. 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 4. 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   On: 07/22/2023 22:22     PROCEDURES:  Critical Care performed: No      .1-3 Lead EKG Interpretation  Performed by: Dominiqua Cooner, Layla Maw, DO Authorized by: Mairim Bade, Layla Maw, DO     Interpretation: normal     ECG rate:  69   ECG rate assessment: normal     Rhythm: sinus rhythm     Ectopy: none     Conduction: normal       IMPRESSION / MDM / ASSESSMENT AND PLAN / ED COURSE  I reviewed the triage vital signs and the nursing notes.    Patient here with syncopal event.  The patient is on the cardiac monitor to evaluate for evidence of arrhythmia  and/or significant heart rate changes.   DIFFERENTIAL DIAGNOSIS (includes but not limited to):   ACS, arrhythmia, anemia, electrolyte derangement, UTI, stroke, intracranial hemorrhage, facial fracture, dissection, PE, dehydration, orthostasis   Patient's presentation is most consistent with acute presentation with potential threat to life or bodily function.   PLAN: Workup initiated from triage.  Patient does have mildly elevated creatinine of 1.5.  Will give IV fluids.  Normal hemoglobin, electrolytes.  Will obtain cardiac labs, urine, LFTs and lipase given dry heaving.  CT head and cervical spine showed no acute traumatic injury but does show dilation of the aortic arch which is incompletely visualized but stable.  I feel she will need a CT scan to rule out dissection given she is demented and unable to provide much history.  It looks like she has a contrast allergy so will need premedication.  Will  also obtain CT of the face given tenderness over the left side of her face with bruising.  Will give Tylenol.  EKG is nonischemic without arrhythmia.  Anticipate admission.   MEDICATIONS GIVEN IN ED: Medications  amLODipine (NORVASC) tablet 10 mg (has no administration in time range)  memantine (NAMENDA) tablet 10 mg (has no administration in time range)  sodium chloride flush (NS) 0.9 % injection 3 mL (has no administration in time range)  acetaminophen (TYLENOL) tablet 650 mg (has no administration in time range)    Or  acetaminophen (TYLENOL) suppository 650 mg (has no administration in time range)  ondansetron (ZOFRAN) tablet 4 mg (has no administration in time range)    Or  ondansetron (ZOFRAN) injection 4 mg (has no administration in time range)  HYDROcodone-acetaminophen (NORCO/VICODIN) 5-325 MG per tablet 1-2 tablet (has no administration in time range)  morphine (PF) 2 MG/ML injection 1 mg (has no administration in time range)  sodium chloride 0.9 % bolus 1,000 mL (0 mLs Intravenous  Stopped 07/23/23 0417)  acetaminophen (TYLENOL) tablet 1,000 mg (1,000 mg Oral Given 07/23/23 0136)  methylPREDNISolone sodium succinate (SOLU-MEDROL) 40 mg/mL injection 40 mg (40 mg Intravenous Given 07/23/23 0139)  diphenhydrAMINE (BENADRYL) capsule 50 mg (50 mg Oral Given 07/23/23 0423)    Or  diphenhydrAMINE (BENADRYL) injection 50 mg ( Intravenous See Alternative 07/23/23 0423)  ondansetron (ZOFRAN) injection 4 mg (4 mg Intravenous Given 07/23/23 0137)  iohexol (OMNIPAQUE) 350 MG/ML injection 100 mL (100 mLs Intravenous Contrast Given 07/23/23 1610)     ED COURSE: CT of the face shows no acute traumatic injury.  CTA chest/abdomen/pelvis shows fusiform ascending aneurysm without rupture, dissection.  Troponin x 2 negative.  Normal LFTs and lipase.  Urine shows no infection, dehydration.  No evidence seen on cardiac monitoring.  Will discuss with hospitalist for admission for syncope.  CONSULTS:  Consulted and discussed patient's case with hospitalist, Dr. Para March.  I have recommended admission and consulting physician agrees and will place admission orders.  Patient (and family if present) agree with this plan.   I reviewed all nursing notes, vitals, pertinent previous records.  All labs, EKGs, imaging ordered have been independently reviewed and interpreted by myself.    OUTSIDE RECORDS REVIEWED: Reviewed last neurology note in 2023.       FINAL CLINICAL IMPRESSION(S) / ED DIAGNOSES   Final diagnoses:  Syncope and collapse     Rx / DC Orders   ED Discharge Orders     None        Note:  This document was prepared using Dragon voice recognition software and may include unintentional dictation errors.   Lilliahna Schubring, Layla Maw, DO 07/23/23 [phone]

## 2023-07-23 NOTE — ED Notes (Signed)
CCMD contacted to confirm cardiac monitoring.

## 2023-07-23 NOTE — Final Progress Note (Signed)
Same-day rounding progress note  Patient seen and examined while in the ED.  I agree with assessment and plan dictated by Dr. Para March.  Please see her dictated H&P for further details.  Recurrent syncope Will order MRI of the brain, EEG and echo. Neurology, cardiology and palliative care consult PT and OT eval  Time spent: 35 minutes

## 2023-07-23 NOTE — Assessment & Plan Note (Signed)
 Anemia panel

## 2023-07-23 NOTE — Hospital Course (Signed)
 [name]

## 2023-07-23 NOTE — Assessment & Plan Note (Signed)
 Continue home amlodipine

## 2023-07-23 NOTE — H&P (Addendum)
History and Physical    Patient: ERNESTO LASHWAY ZOX:[id number] DOB: 23-Jun-1940 DOA: 07/23/2023 DOS: the patient was seen and examined on 07/23/2023 PCP: Hillery Aldo, MD  Patient coming from: Home  Chief Complaint:  Chief Complaint  Patient presents with   Loss of Consciousness    HPI: CESILY CUOCO is a 83 y.o. female with medical history significant for HTN,, high cholesterol, migraine, depression, anxiety, breast cancer and colon cancer  dementia, being admitted following a syncopal episode.  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  History is given by granddaughter at bedside who states she was in her usual state of health and not recently ill, no recent respiratory or GI illnesses.  States she was in her room and her grandmother came and told her that she was going to use the bathroom and then go to bed then all of a sudden "her face changed" and her eyes rolled back in her head and she fell onto the ground hitting her head on the hardwood floor.  Patient was out for 5 to 10 minutes and gradually came around.  She was incontinent of stool and had some dry heaving afterwards.    No jerking observed.   Patient has not recently complained of chest pain or shortness of breath. Granddaughter says that just a couple hours prior to he had gone to a restaurant and brought food back to eat.   Of note, patient has chronic headaches and memory loss and is followed by neurologist.  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 Labs notable for hemoglobin 11.2, creatinine 1.5 which is near baseline but otherwise mostly unremarkable. EKG, personally viewed and  interpreted showing sinus at 76 with PVCs CT head and C-spine nonacute CTA aorta ordered from the ED and pending, delayed as patient needs premedication for contrasted study CT maxillofacial also ordered  Hospitalist consulted for admission.     Past Medical History:  Diagnosis Date   Anxiety state, unspecified    Arthropathy, unspecified, site unspecified    Benign carcinoid tumor of the duodenum    Blindness of one eye    Breast cancer (HCC) 2009   Cancer [facility])    breast   Colon cancer (HCC)    Depressive disorder, not elsewhere classified    Diarrhea    Disease of gallbladder    Esophageal cancer (HCC)    GERD (gastroesophageal reflux disease)    Headache(784.0)    Hyperlipidemia    Hypertension    Kidney stones    OA (osteoarthritis) of knee    right   Personal history of radiation therapy    Syncope and collapse    Unspecified ectopic pregnancy without intrauterine pregnancy    Past Surgical History:  Procedure Laterality Date   BREAST BIOPSY Bilateral    BREAST EXCISIONAL BIOPSY Right 2009   (+)   BREAST SURGERY Right    Cataract surgery Left 10/11/2017   Implant   CHOLECYSTECTOMY  05-27-12   COLON RESECTION     COLONOSCOPY     PARTIAL HYSTERECTOMY     TOTAL KNEE ARTHROPLASTY Right    Social History:  reports that she has never smoked. She has never used  smokeless tobacco. She reports that she does not drink alcohol and does not use drugs.  Allergies  Allergen Reactions   Iodinated Contrast Media Hives   Iohexol Hives     Code: HIVES, Desc: PTS GRANDDAUGHTER [name] CALLED 12/31/08 TO NOTIFY us THAT HER GRANDMOTHER HAD A REACTION TO THE IV DYE 8 HRS POST CT EXAM PT DEVELOPED HIVES/RASH.12/31/08/RM, Onset Date: [id number]    Morphine Other (See Comments)    confusion    Family History  Problem Relation Age of Onset   Cancer Mother        unknown type   Cancer Father    Heart attack Brother    Cancer Paternal Grandmother    Diabetes Daughter    Breast  cancer Neg Hx    Alzheimer's disease Neg Hx     Prior to Admission medications   Medication Sig Start Date End Date Taking? Authorizing Provider  alendronate (FOSAMAX) 70 MG tablet alendronate 70 mg tablet  TAKE 1 TABLET BY MOUTH ONCE A WEEK    [provider]  amLODipine (NORVASC) 10 MG tablet Take 10 mg by mouth daily.    [provider]  aspirin 81 MG EC tablet Take 81 mg by mouth daily.     [provider]  brimonidine (ALPHAGAN) 0.2 % ophthalmic solution brimonidine 0.2 % eye drops    [provider]  dorzolamide (TRUSOPT) 2 % ophthalmic solution Place 1 drop into both eyes 2 (two) times daily.    [provider]  famotidine (PEPCID) 20 MG tablet Take 1 tablet (20 mg total) by mouth daily. 10/21/20 11/30/20  Shaune Pollack, MD  latanoprost (XALATAN) 0.005 % ophthalmic solution Place 1 drop into both eyes at bedtime.    [provider]  memantine (NAMENDA) 10 MG tablet TAKE 1 TABLET BY MOUTH TWICE DAILY.  PATIENT NEEDS AN OFFICE VISIT FOR FUTURE RX REFILLS 05/24/23   Anson Fret, MD  sertraline (ZOLOFT) 100 MG tablet Take 150 mg by mouth daily.    [provider]  timolol (TIMOPTIC) 0.5 % ophthalmic solution timolol maleate 0.5 % eye drops    [provider]    Physical Exam: Vitals:   07/23/23 0012 07/23/23 0100 07/23/23 0300 07/23/23 0400  BP: (!) 148/104 (!) 161/106 (!) 164/99 (!) 140/86  Pulse: 73 69 64 62  Resp: 18 18 17 17   Temp: 98.3 F (36.8 C)   98.2 F (36.8 C)  TempSrc: Oral   Oral  SpO2: 99% 99% 97% 96%  Weight:      Height:       Physical Exam Vitals and nursing note reviewed.  Constitutional:      General: She is not in acute distress. HENT:     Head: Normocephalic and atraumatic.  Cardiovascular:     Rate and Rhythm: Normal rate and regular rhythm.     Heart sounds: Normal heart sounds.  Pulmonary:     Effort: Pulmonary effort is normal.     Breath sounds: Normal breath sounds.   Abdominal:     Palpations: Abdomen is soft.     Tenderness: There is no abdominal tenderness.  Neurological:     Mental Status: Mental status is at baseline.     Labs on Admission: I have personally reviewed following labs and imaging studies  CBC: Recent Labs  Lab 07/22/23 1758  WBC 5.4  HGB 11.2*  HCT 34.7*  MCV 94.8  PLT 172   Basic Metabolic Panel: Recent Labs  Lab 07/22/23  1758 07/23/23 0129  NA 139  --   K 3.5  --   CL 104  --   CO2 24  --   GLUCOSE 153*  --   BUN 22  --   CREATININE 1.50*  --   CALCIUM 9.3  --   MG  --  2.4   GFR: Estimated Creatinine Clearance: 23.8 mL/min (A) (by C-G formula based on SCr of 1.5 mg/dL (H)). Liver Function Tests: Recent Labs  Lab 07/23/23 0129  AST 40  ALT 32  ALKPHOS 72  BILITOT 0.8  PROT 7.3  ALBUMIN 3.9   Recent Labs  Lab 07/23/23 0129  LIPASE 26   No results for input(s): "AMMONIA" in the last 168 hours. Coagulation Profile: No results for input(s): "INR", "PROTIME" in the last 168 hours. Cardiac Enzymes: No results for input(s): "CKTOTAL", "CKMB", "CKMBINDEX", "TROPONINI" in the last 168 hours. BNP (last 3 results) No results for input(s): "PROBNP" in the last 8760 hours. HbA1C: No results for input(s): "HGBA1C" in the last 72 hours. CBG: No results for input(s): "GLUCAP" in the last 168 hours. Lipid Profile: No results for input(s): "CHOL", "HDL", "LDLCALC", "TRIG", "CHOLHDL", "LDLDIRECT" in the last 72 hours. Thyroid Function Tests: No results for input(s): "TSH", "T4TOTAL", "FREET4", "T3FREE", "THYROIDAB" in the last 72 hours. Anemia Panel: No results for input(s): "VITAMINB12", "FOLATE", "FERRITIN", "TIBC", "IRON", "RETICCTPCT" in the last 72 hours. Urine analysis:    Component Value Date/Time   COLORURINE YELLOW (A) 05/14/2019 1955   APPEARANCEUR Clear 02/20/2022 1124   LABSPEC 1.008 05/14/2019 1955   LABSPEC 1.021 05/08/2012 1759   PHURINE 6.0 05/14/2019 1955   GLUCOSEU Negative  02/20/2022 1124   GLUCOSEU Negative 05/08/2012 1759   HGBUR NEGATIVE 05/14/2019 1955   BILIRUBINUR Negative 02/20/2022 1124   BILIRUBINUR Negative 05/08/2012 1759   KETONESUR NEGATIVE 05/14/2019 1955   PROTEINUR Negative 02/20/2022 1124   PROTEINUR NEGATIVE 05/14/2019 1955   UROBILINOGEN 0.2 06/23/2007 0827   NITRITE Negative 02/20/2022 1124   NITRITE NEGATIVE 05/14/2019 1955   LEUKOCYTESUR 2+ (A) 02/20/2022 1124   LEUKOCYTESUR MODERATE (A) 05/14/2019 1955   LEUKOCYTESUR Trace 05/08/2012 1759    Radiological Exams on Admission: CT Head Wo Contrast Result Date: 07/22/2023 CLINICAL DATA:  Head trauma, minor (Age >= 65y); Neck trauma (Age >= 65y), syncope, fall EXAM: CT HEAD WITHOUT CONTRAST CT CERVICAL SPINE WITHOUT CONTRAST TECHNIQUE: Multidetector CT imaging of the head and cervical spine was performed following the standard protocol without intravenous contrast. Multiplanar CT image reconstructions of the cervical spine were also generated. RADIATION DOSE REDUCTION: This exam was performed according to the departmental dose-optimization program which includes automated exposure control, adjustment of the mA and/or kV according to patient size and/or use of iterative reconstruction technique. COMPARISON:  None Available. FINDINGS: CT HEAD FINDINGS Brain: Normal anatomic configuration. No abnormal intra or extra-axial mass lesion or fluid collection. No abnormal mass effect or midline shift. No evidence of acute intracranial hemorrhage or infarct. Ventricular size is normal. Cerebellum unremarkable. Vascular: Unremarkable Skull: Intact Sinuses/Orbits: Paranasal sinuses are clear. Orbits are unremarkable. Other: Mastoid air cells and middle ear cavities are clear. Moderate left frontal scalp soft tissue swelling extending inferiorly superficial to the lateral orbital wall. CT CERVICAL SPINE FINDINGS Alignment: Normal. Skull base and vertebrae: Craniocervical alignment is normal. The atlantodental  interval is not widened. No acute fracture of the cervical spine. Vertebral body height is preserved. Soft tissues and spinal canal: No prevertebral fluid or swelling. No visible canal hematoma. Disc levels:  There is intervertebral disc space narrowing and endplate remodeling throughout the cervical spine in keeping with changes advanced degenerative disc disease. Prevertebral soft tissues are not thickened on sagittal reformats. The spinal canal is widely patent. Multilevel uncovertebral arthrosis and to a lesser extent facet arthrosis in combination with disc height loss results in severe bilateral neuroforaminal narrowing at C3-C6 with moderate narrowing noted at C6-7 bilaterally. Upper chest: The aortic arch is dilated measuring 4.2 cm in diameter, similar to prior examination of 08/16/2016. No acute abnormality Other: None IMPRESSION: 1. No acute intracranial abnormality. No calvarial fracture. Moderate left frontal scalp soft tissue swelling extending inferiorly superficial to the lateral orbital wall. 2. No acute fracture or listhesis of the cervical spine. 3. 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 4. 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   On: 07/22/2023 22:22   CT Cervical Spine Wo Contrast Result Date: 07/22/2023 CLINICAL DATA:  Head trauma, minor (Age >= 65y); Neck trauma (Age >= 65y), syncope, fall EXAM: CT HEAD WITHOUT CONTRAST CT CERVICAL SPINE WITHOUT CONTRAST TECHNIQUE: Multidetector CT imaging of the head and cervical spine was performed following the standard protocol without intravenous contrast. Multiplanar CT image reconstructions of the cervical spine were also generated. RADIATION DOSE REDUCTION: This exam was performed according to the departmental dose-optimization program which includes automated  exposure control, adjustment of the mA and/or kV according to patient size and/or use of iterative reconstruction technique. COMPARISON:  None Available. FINDINGS: CT HEAD FINDINGS Brain: Normal anatomic configuration. No abnormal intra or extra-axial mass lesion or fluid collection. No abnormal mass effect or midline shift. No evidence of acute intracranial hemorrhage or infarct. Ventricular size is normal. Cerebellum unremarkable. Vascular: Unremarkable Skull: Intact Sinuses/Orbits: Paranasal sinuses are clear. Orbits are unremarkable. Other: Mastoid air cells and middle ear cavities are clear. Moderate left frontal scalp soft tissue swelling extending inferiorly superficial to the lateral orbital wall. CT CERVICAL SPINE FINDINGS Alignment: Normal. Skull base and vertebrae: Craniocervical alignment is normal. The atlantodental interval is not widened. No acute fracture of the cervical spine. Vertebral body height is preserved. Soft tissues and spinal canal: No prevertebral fluid or swelling. No visible canal hematoma. Disc levels: There is intervertebral disc space narrowing and endplate remodeling throughout the cervical spine in keeping with changes advanced degenerative disc disease. Prevertebral soft tissues are not thickened on sagittal reformats. The spinal canal is widely patent. Multilevel uncovertebral arthrosis and to a lesser extent facet arthrosis in combination with disc height loss results in severe bilateral neuroforaminal narrowing at C3-C6 with moderate narrowing noted at C6-7 bilaterally. Upper chest: The aortic arch is dilated measuring 4.2 cm in diameter, similar to prior examination of 08/16/2016. No acute abnormality Other: None IMPRESSION: 1. No acute intracranial abnormality. No calvarial fracture. Moderate left frontal scalp soft tissue swelling extending inferiorly superficial to the lateral orbital wall. 2. No acute fracture or listhesis of the cervical spine. 3. Advanced degenerative  disc and degenerative joint disease resulting in multilevel neuroforaminal narrowing, severe bilaterally at C3-C6. 4. 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   On: 07/22/2023 22:22     Data Reviewed: Relevant notes from primary care and specialist visits, past discharge summaries as available in EHR, including Care Everywhere. Prior diagnostic testing as pertinent to current admission diagnoses Updated medications and  problem lists for reconciliation ED course, including vitals, labs, imaging, treatment and response to treatment Triage notes, nursing and pharmacy notes and ED provider's notes Notable results as noted in HPI   Assessment and Plan: * Syncope and collapse History of prior syncope Etiology uncertain, some concern for seizure given stool incontinence EEG, echocardiogram, continuous cardiac monitoring Neurologic checks with fall and aspiration precautions Will get D-dimer given history of breast and colon cancer   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  CTA chest if elevated  Anemia Anemia panel  Dementia with behavioral disturbance (HCC) Delirium precautions Continue memantine  Essential hypertension Continue home amlodipine  Anxiety and depression Continue sertraline      DVT prophylaxis: SCD pending CTA aorta  Consults: none  Advance Care Planning:   Code Status: Prior   Family Communication: Granddaughter at bedside  Disposition Plan: Back to previous home environment  Severity of Illness: The appropriate patient status for this patient is OBSERVATION. Observation status is judged to be reasonable and necessary in order to provide the required intensity of service to ensure the patient's  safety. The patient's presenting symptoms, physical exam findings, and initial radiographic and laboratory data in the context of their medical condition is felt to place them at decreased risk for further clinical deterioration. Furthermore, it is anticipated that the patient will be medically stable for discharge from the hospital within 2 midnights of admission.   Author: Andris Baumann, MD 07/23/2023 4:41 AM  For on call review [email or web address].

## 2023-07-23 NOTE — Assessment & Plan Note (Signed)
 Continue sertraline

## 2023-07-23 NOTE — Consult Note (Addendum)
 Consultation Note Date: 07/23/2023   Patient Name: [name]  DOB: 27-Dec-1940  MRN: [id number]  Age / Sex: 83 y.o., female  PCP: Hillery Aldo, MD Referring Physician: Delfino Lovett, MD  Reason for Consultation: Establishing goals of care  HPI/Patient Profile: JULIYAH MERGEN is a 83 y.o. female with medical history significant for HTN,, high cholesterol, migraine, depression, anxiety, breast cancer and colon cancer  dementia, being admitted following a syncopal episode.  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  History is given by granddaughter at bedside who states she was in her usual state of health and not recently ill, no recent illnesses.  States she patient told her she was going to the bathroom and then wanted to go lay down afterwards.  She states while walking down the hall her grandmother collapsed and passed out, prior to regaining consciousness.   Clinical Assessment and Goals of Care: Notes and labs reviewed.  In to see patient.  She is currently sitting in bed with granddaughter Rodney Booze at bedside.  She denies complaint at this time.  Mickie Bail states she is her grandmother's H POA and they live together.  She states her grandmother was diagnosed with dementia in 2016.  She states she works for [employer] and when she is at work, her mother or other family members will stay with her. Family is concerned that she has left the stove on in the past, and she is unable to be alone.  She states at baseline her grandmother uses a walker that is single tip.  She states she would like 4-prong cane for better stability.  She states she uses a depend as she tries to get to the bathroom for urination and bowel movements but sometimes is unable to make it in time.   Granddaughter states she does not allow her to cook at all and patient does no housework.  She states she loves to watch afternoon soap operas and is able to keep up with the events from day-to-day.  Patient is able to tell me the events that led her in to the hospital, and that we are at [facility].  She begins to discuss a previous syncopal episode but begins talking about a prior knee surgery.  She becomes confused and begins talking about walking down the road.  With redirection she begins talking about her childhood in depth and jobs she has had in the past.  Granddaughter has H POA document confirming that she is patient's H POA.  She states at this time her grandmother is full code and full scope.     SUMMARY OF RECOMMENDATIONS   Continue full code and full scope care. Granddaughter Rodney Booze is H POA.        Primary Diagnoses: Present on Admission:  Syncope and collapse  Essential hypertension  Anxiety and depression  TBI (traumatic brain injury) (HCC)  Dementia with behavioral disturbance (HCC)  Chronic headaches   I have reviewed the medical record, interviewed the patient and family, and examined the patient. The following aspects are pertinent.  Past Medical History:  Diagnosis Date   Anxiety state, unspecified    Arthropathy, unspecified, site unspecified    Benign carcinoid tumor of the duodenum    Blindness of one eye    Breast cancer (HCC) 2009   Cancer [facility])    breast   Colon cancer (HCC)    Depressive disorder, not elsewhere classified    Diarrhea    Disease of gallbladder    Esophageal cancer (HCC)    GERD (gastroesophageal reflux disease)    Headache(784.0)    Hyperlipidemia    Hypertension    Kidney stones    OA (osteoarthritis) of knee    right   Personal history of radiation therapy    Syncope and collapse    Unspecified ectopic pregnancy without intrauterine pregnancy    Social History   Socioeconomic History   Marital  status: Widowed    Spouse name: Not on file   Number of children: Not on file   Years of education: Not on file   Highest education level: 6th grade  Occupational History   Occupation: Retired  Tobacco Use   Smoking status: Never   Smokeless tobacco: Never  Vaping Use   Vaping status: Never Used  Substance and Sexual Activity   Alcohol use: No   Drug use: No   Sexual activity: Not on file  Other Topics Concern   Not on file  Social History Narrative   Lives alone   Caffeine use: <1 cup coffee/day, 2-3 pepsi per day   Social Drivers of Corporate investment banker Strain: Not on file  Food Insecurity: Not on file  Transportation Needs: Not on file  Physical Activity: Not on file  Stress: Not on file  Social Connections: Not on file   Family History  Problem Relation Age of Onset   Cancer Mother        unknown type   Cancer Father    Heart attack Brother    Cancer Paternal Grandmother    Diabetes Daughter    Breast cancer Neg Hx    Alzheimer's disease Neg Hx    Scheduled Meds:  amLODipine  10 mg Oral Daily   memantine  10 mg Oral BID   sodium chloride flush  3 mL Intravenous Q12H   Continuous Infusions: PRN Meds:.acetaminophen **OR** acetaminophen, HYDROcodone-acetaminophen, morphine injection, ondansetron **OR** ondansetron (ZOFRAN) IV Medications Prior to Admission:  Prior to Admission medications   Medication Sig Start Date End Date Taking? Authorizing Provider  alendronate (FOSAMAX) 70 MG tablet alendronate 70 mg tablet  TAKE 1 TABLET BY MOUTH ONCE A WEEK   Yes [provider]  amLODipine (NORVASC) 10 MG tablet Take 10 mg by mouth daily.   Yes [provider]  aspirin 81 MG EC tablet Take 81 mg by mouth daily.    Yes [provider]  brimonidine (ALPHAGAN) 0.2 % ophthalmic solution Place 1 drop into both eyes 2 (two) times daily.   Yes [provider]  dorzolamide-timolol (COSOPT) 2-0.5 % ophthalmic solution Place 1 drop  into both eyes 2 (two) times daily. 07/11/23  Yes [provider]  famotidine (PEPCID) 20 MG tablet Take 1 tablet (20 mg total) by mouth daily. 10/21/20 07/23/23 Yes Shaune Pollack, MD  latanoprost (XALATAN) 0.005 % ophthalmic solution Place 1 drop into both  eyes at bedtime.   Yes [provider]  memantine (NAMENDA) 10 MG tablet TAKE 1 TABLET BY MOUTH TWICE DAILY.  PATIENT NEEDS AN OFFICE VISIT FOR FUTURE RX REFILLS 05/24/23  Yes Anson Fret, MD  sertraline (ZOLOFT) 100 MG tablet Take 200 mg by mouth daily.   Yes [provider]  dorzolamide (TRUSOPT) 2 % ophthalmic solution Place 1 drop into both eyes 2 (two) times daily. Patient not taking: Reported on 07/23/2023    [provider]  timolol (TIMOPTIC) 0.5 % ophthalmic solution timolol maleate 0.5 % eye drops Patient not taking: Reported on 07/23/2023    [provider]   Allergies  Allergen Reactions   Iodinated Contrast Media Hives   Morphine Other (See Comments)    confusion  Other Reaction(s): confusion   Iohexol Hives     Code: HIVES, Desc: PTS GRANDDAUGHTER [name] CALLED 12/31/08 TO NOTIFY us THAT HER GRANDMOTHER HAD A REACTION TO THE IV DYE 8 HRS POST CT EXAM PT DEVELOPED HIVES/RASH.12/31/08/RM, Onset Date: [id number]    Review of Systems  All other systems reviewed and are negative.   Physical Exam Pulmonary:     Effort: Pulmonary effort is normal.  Neurological:     Mental Status: She is alert.     Vital Signs: BP 120/89   Pulse 67   Temp 98.2 F (36.8 C) (Oral)   Resp (!) 27   Ht 5' (1.524 m)   Wt 62 kg   SpO2 100%   BMI 26.69 kg/m  Pain Scale: 0-10   Pain Score: 0-No pain   SpO2: SpO2: 100 % O2 Device:SpO2: 100 % O2 Flow Rate: .   IO: Intake/output summary: No intake or output data in the 24 hours ending 07/23/23 1203  LBM:   Baseline Weight: Weight: 62 kg Most recent weight: Weight: 62 kg        Signed by: Morton Stall, NP   Please contact  Palliative Medicine Team phone at [phone] for questions and concerns.  For individual provider: See Loretha Stapler

## 2023-07-23 NOTE — Assessment & Plan Note (Addendum)
History of prior syncope Etiology uncertain, some concern for seizure given stool incontinence EEG, echocardiogram, continuous cardiac monitoring Neurologic checks with fall and aspiration precautions Will get D-dimer given history of breast and colon cancer

## 2023-07-23 NOTE — Consult Note (Signed)
NEUROLOGY CONSULT NOTE   Date of service: July 23, 2023 Patient Name: [name] MRN:  [id number] DOB:  10-Nov-1940 Chief Complaint: Syncope versus seizure Requesting Provider: Delfino Lovett, MD  History of Present Illness  MARCELA ALATORRE is a 83 y.o. female with a PMHx of Anxiety state, unspecified, Arthropathy, unspecified, site unspecified, Benign carcinoid tumor of the duodenum, Blindness of one eye, Breast cancer (HCC) (2009), Cancer (HCC), Colon cancer (HCC), Depressive disorder, not elsewhere classified, Diarrhea, Disease of gallbladder, Esophageal cancer (HCC), GERD (gastroesophageal reflux disease), Headache(784.0), Hyperlipidemia, Hypertension, Kidney stones, OA (osteoarthritis) of knee, Personal history of radiation therapy, Syncope and collapse, and Unspecified ectopic pregnancy without intrauterine pregnancy who presented to the ED yesterday evening via EMS after a syncopal episode at home with associated complete LOC x 5 minutes followed by a semiresponsive state during which her granddaughter was able to move her up into a chair. Granddaughter states that the patient was at her baseline, but feeling ill after a trip shopping, when she walked past granddaughter in the hallway to her bedroom stating she wasn't feeling well and had to go to the bathroom, when she passed out. Granddaughter states that patient's eyes suddenly closed, she lost consciousness, fell sideways onto a wall, then slid to the floor and struck the left side of her head on the hardwood floor, after which she remained unarousable for about 5 minutes. The patient was incontinent of stool, which was noticed right after she fell. She did not exhibit any seizure-like activity.     The patient has chronic headaches and memory loss and is followed by a Neurologist.  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   In the ED today, the patient is conversant and does not complain of any deficits.    ROS  Comprehensive ROS performed and pertinent positives documented in HPI   Past History   Past Medical History:  Diagnosis Date   Anxiety state, unspecified    Arthropathy, unspecified, site unspecified    Benign carcinoid tumor of the duodenum    Blindness of one eye    Breast cancer (HCC) 2009   Cancer [facility])    breast   Colon cancer (HCC)    Depressive disorder, not elsewhere classified    Diarrhea    Disease of gallbladder    Esophageal cancer (HCC)    GERD (gastroesophageal reflux disease)    Headache(784.0)    Hyperlipidemia    Hypertension    Kidney stones    OA (osteoarthritis) of knee    right   Personal history of radiation therapy    Syncope and collapse    Unspecified ectopic pregnancy without intrauterine pregnancy     Past Surgical History:  Procedure Laterality Date   BREAST BIOPSY Bilateral    BREAST EXCISIONAL BIOPSY Right 2009   (+)   BREAST SURGERY Right    Cataract surgery Left 10/11/2017   Implant   CHOLECYSTECTOMY  05-27-12   COLON RESECTION     COLONOSCOPY     PARTIAL HYSTERECTOMY     TOTAL KNEE ARTHROPLASTY Right     Family History: Family History  Problem Relation Age of Onset   Cancer Mother        unknown type   Cancer Father    Heart attack Brother    Cancer Paternal Grandmother    Diabetes Daughter  Breast cancer Neg Hx    Alzheimer's disease Neg Hx     Social History  reports that she has never smoked. She has never used smokeless tobacco. She reports that she does not drink alcohol and does not use drugs.  Allergies  Allergen Reactions   Iodinated Contrast Media Hives   Morphine Other (See Comments)    confusion  Other Reaction(s): confusion   Iohexol Hives     Code: HIVES, Desc: PTS GRANDDAUGHTER [name] CALLED 12/31/08 TO NOTIFY us THAT HER GRANDMOTHER HAD A REACTION TO THE IV DYE 8 HRS  POST CT EXAM PT DEVELOPED HIVES/RASH.12/31/08/RM, Onset Date: 16109604     Medications   Current Facility-Administered Medications:    acetaminophen (TYLENOL) tablet 650 mg, 650 mg, Oral, Q6H PRN **OR** acetaminophen (TYLENOL) suppository 650 mg, 650 mg, Rectal, Q6H PRN, Andris Baumann, MD   amLODipine (NORVASC) tablet 10 mg, 10 mg, Oral, Daily, Lindajo Royal V, MD, 10 mg at 07/23/23 0957   HYDROcodone-acetaminophen (NORCO/VICODIN) 5-325 MG per tablet 1-2 tablet, 1-2 tablet, Oral, Q4H PRN, Andris Baumann, MD   memantine [facility]) tablet 10 mg, 10 mg, Oral, BID, Andris Baumann, MD, 10 mg at 07/23/23 0957   morphine (PF) 2 MG/ML injection 1 mg, 1 mg, Intravenous, Q2H PRN, Andris Baumann, MD   ondansetron (ZOFRAN) tablet 4 mg, 4 mg, Oral, Q6H PRN **OR** ondansetron (ZOFRAN) injection 4 mg, 4 mg, Intravenous, Q6H PRN, Lindajo Royal V, MD   sodium chloride flush (NS) 0.9 % injection 3 mL, 3 mL, Intravenous, Q12H, Lindajo Royal V, MD, 3 mL at 07/23/23 1013  Current Outpatient Medications:    alendronate (FOSAMAX) 70 MG tablet, alendronate 70 mg tablet  TAKE 1 TABLET BY MOUTH ONCE A WEEK, Disp: , Rfl:    amLODipine (NORVASC) 10 MG tablet, Take 10 mg by mouth daily., Disp: , Rfl:    aspirin 81 MG EC tablet, Take 81 mg by mouth daily. , Disp: , Rfl:    brimonidine (ALPHAGAN) 0.2 % ophthalmic solution, Place 1 drop into both eyes 2 (two) times daily., Disp: , Rfl:    dorzolamide-timolol (COSOPT) 2-0.5 % ophthalmic solution, Place 1 drop into both eyes 2 (two) times daily., Disp: , Rfl:    famotidine (PEPCID) 20 MG tablet, Take 1 tablet (20 mg total) by mouth daily., Disp: 30 tablet, Rfl: 0   latanoprost (XALATAN) 0.005 % ophthalmic solution, Place 1 drop into both eyes at bedtime., Disp: , Rfl:    memantine (NAMENDA) 10 MG tablet, TAKE 1 TABLET BY MOUTH TWICE DAILY.  PATIENT NEEDS AN OFFICE VISIT FOR FUTURE RX REFILLS, Disp: 180 tablet, Rfl: 0   sertraline (ZOLOFT) 100 MG tablet, Take 200 mg by  mouth daily., Disp: , Rfl:    dorzolamide (TRUSOPT) 2 % ophthalmic solution, Place 1 drop into both eyes 2 (two) times daily. (Patient not taking: Reported on 07/23/2023), Disp: , Rfl:    timolol (TIMOPTIC) 0.5 % ophthalmic solution, timolol maleate 0.5 % eye drops (Patient not taking: Reported on 07/23/2023), Disp: , Rfl:   Vitals   Vitals:   07/23/23 0515 07/23/23 0830 07/23/23 1130 07/23/23 1343  BP: (!) 154/97 (!) 126/96 120/89   Pulse: 67 77 67   Resp: 17 15 (!) 27   Temp:    98.1 F (36.7 C)  TempSrc:      SpO2: 100% 100% 100%   Weight:      Height:        Body mass index is 26.69  kg/m.  Physical Exam   Physical Exam HEENT- Bruising to left cheek and temple  Lungs- Respirations unlabored Extremities- Warm and well-perfused  Neurological Examination Mental Status: Awake and alert. Fully oriented. Thought content appropriate.  Speech fluent without evidence of aphasia.  Able to follow all commands without difficulty. Cranial Nerves: II: Visual fields intact OS, Severe chronic vision loss OD. Left 2 mm and pupil briskly reactive. Right pupil 3 mm and sluggishly reactive with lens opacity visible on gross inspection. III,IV, VI: Right palpebral fissure wider than on left. EOMI. No nystagmus. V: Sensation equal bilaterally to FT except in the region of left sided bruishing VII: Smile symmetric VIII: Hearing intact to voice IX,X: No hypophonia or hoarseness XI: Symmetric XII: Midline tongue extension Motor: RUE: 5/5 LUE: 5/5 RLE: 5/5 LLE: 5/5 Sensory: FT intact x 4. No extinction to DSS. Deep Tendon Reflexes: 2+ and symmetric bilateral biceps, brachioradialis and patellae Cerebellar: No ataxia with FNF or RAM bilaterally Gait: Deferred   Labs/Imaging/Neurodiagnostic studies   CBC:  Recent Labs  Lab 2023/08/06 1758  WBC 5.4  HGB 11.2*  HCT 34.7*  MCV 94.8  PLT 172   Basic Metabolic Panel:  Lab Results  Component Value Date   NA 139 08/06/2023   K 3.5  August 06, 2023   CO2 24 08-06-23   GLUCOSE 153 (H) 2023-08-06   BUN 22 August 06, 2023   CREATININE 1.50 (H) 08-06-23   CALCIUM 9.3 Aug 06, 2023   GFRNONAA 35 (L) 08-06-23   GFRAA 58 (L) 05/14/2019     ASSESSMENT  Cheyenne Bordeaux Read is an 83 y.o. female presenting after LOC with a fall at home, during which she experienced bowel incontinence. Overall semiology of the event has characteristics more suggestive of syncope, but seizure is also possible. She has no prior history of seizures.  - Exam reveals no focal deficit other than chronic right monocular vision loss.  - MRI brain: No acute intracranial abnormality. Moderate chronic microvascular ischemic changes of the white matter, progressed compared to prior MRI. Moderate parenchymal volume loss, more pronounced in the bilateral parietal regions. Left frontal scalp swelling extending inferiorly to the level of the zygomatic arch. - Labs: - Has an elevated Cr, worsened since 2023. Estimated GFR is 35.  - Na, K and calcium are normal. Mg also normal. WBC normal.  - Overall impression: Episode of LOC with incontinence, but patient had stated that she was about to go to the bathroom just prior to the episode. No jerking or twitching noted by family. Most likely etiology is syncope with concussion and LOC after striking her head.   RECOMMENDATIONS  - EEG (ordered) - Orthostatics - Work up for possible cardiogenic syncope ______________________________________________________________________    Dessa Phi, Jennilee Demarco, MD [facility]

## 2023-07-24 ENCOUNTER — Observation Stay (HOSPITAL_BASED_OUTPATIENT_CLINIC_OR_DEPARTMENT_OTHER)
Admit: 2023-07-24 | Discharge: 2023-07-24 | Disposition: A | Discharge status: Home / Self Care | Payer: 59 | Attending: Internal Medicine

## 2023-07-24 ENCOUNTER — Encounter: Payer: Self-pay | Admitting: Internal Medicine

## 2023-07-24 ENCOUNTER — Ambulatory Visit: Payer: 59

## 2023-07-24 DIAGNOSIS — R569 Unspecified convulsions: Secondary | ICD-10-CM | POA: Diagnosis not present

## 2023-07-24 DIAGNOSIS — Z853 Personal history of malignant neoplasm of breast: Secondary | ICD-10-CM | POA: Diagnosis not present

## 2023-07-24 DIAGNOSIS — R55 Syncope and collapse: Secondary | ICD-10-CM

## 2023-07-24 DIAGNOSIS — F03918 Unspecified dementia, unspecified severity, with other behavioral disturbance: Secondary | ICD-10-CM | POA: Diagnosis not present

## 2023-07-24 DIAGNOSIS — Q2544 Congenital dilation of aorta: Secondary | ICD-10-CM | POA: Diagnosis not present

## 2023-07-24 DIAGNOSIS — Z7189 Other specified counseling: Secondary | ICD-10-CM | POA: Diagnosis not present

## 2023-07-24 LAB — CBC
HCT: 36.4 % (ref 36.0–46.0)
Hemoglobin: 11.8 g/dL — ABNORMAL LOW (ref 12.0–15.0)
MCH: 31.1 pg (ref 26.0–34.0)
MCHC: 32.4 g/dL (ref 30.0–36.0)
MCV: 96 fL (ref 80.0–100.0)
Platelets: 169 10*3/uL (ref 150–400)
RBC: 3.79 MIL/uL — ABNORMAL LOW (ref 3.87–5.11)
RDW: 14.5 % (ref 11.5–15.5)
WBC: 6.5 10*3/uL (ref 4.0–10.5)
nRBC: 0 % (ref 0.0–0.2)

## 2023-07-24 LAB — ECHOCARDIOGRAM COMPLETE
AR max vel: 2.66 cm2
AV Area VTI: 2.49 cm2
AV Area mean vel: 2.64 cm2
AV Mean grad: 2.5 mm[Hg]
AV Peak grad: 5.2 mm[Hg]
Ao pk vel: 1.15 m/s
Area-P 1/2: 7.44 cm2
MV VTI: 2.4 cm2
P 1/2 time: 717 ms
S' Lateral: 3 cm

## 2023-07-24 LAB — BASIC METABOLIC PANEL
Anion gap: 8 (ref 5–15)
BUN: 17 mg/dL (ref 8–23)
CO2: 25 mmol/L (ref 22–32)
Calcium: 9.5 mg/dL (ref 8.9–10.3)
Chloride: 107 mmol/L (ref 98–111)
Creatinine, Ser: 1.25 mg/dL — ABNORMAL HIGH (ref 0.44–1.00)
GFR, Estimated: 43 mL/min — ABNORMAL LOW (ref >60)
Glucose, Bld: 87 mg/dL (ref 70–99)
Potassium: 3.6 mmol/L (ref 3.5–5.1)
Sodium: 140 mmol/L (ref 135–145)

## 2023-07-24 LAB — D-DIMER, QUANTITATIVE: D-Dimer, Quant: 0.96 ug{FEU}/mL — ABNORMAL HIGH (ref 0.00–0.50)

## 2023-07-24 NOTE — ED Notes (Signed)
RAC IV would not flush. 2 unsuccessful attempts

## 2023-07-24 NOTE — ED Notes (Signed)
Pt assisted to bedside commode then back into bed. Denies needs at this time. Granddaughter remains at bedside

## 2023-07-24 NOTE — Evaluation (Signed)
Physical Therapy Evaluation Patient Details Name: [name] MRN: [id number] DOB: 03-Nov-1940 Today's Date: 07/24/2023  History of Present Illness  Pt is an 83 year old female admitted following a syncopal episode     PMH significant for HTN,, high cholesterol, migraine, depression, anxiety, breast cancer and colon cancer  dementia  Clinical Impression  PT/OT co-evaluation performed due to cognition/line management. Pt is a pleasant 83 year old female who was admitted for syncope. Pt performs bed mobility with supervision, transfers with cga, and ambulation with cga with RW. Also tried ambulation with QC, however still presents with unsteadiness. Recommending using RW at all times for safety. No complaints of dizziness. Pt demonstrates deficits with strength/balance. Would benefit from skilled PT to address above deficits and promote optimal return to PLOF. Pt will continue to receive skilled PT services while admitted and will defer to TOC/care team for updates regarding disposition planning.   Supine: 132/88; HR 68 Seated: 118/82; HR 74 Standing: 92/56; HR 86 Standing 3 min: 112/78; HR 78        If plan is discharge home, recommend the following: A little help with walking and/or transfers;Help with stairs or ramp for entrance;Supervision due to cognitive status   Can travel by private vehicle        Equipment Recommendations Rolling walker (2 wheels)  Recommendations for Other Services       Functional Status Assessment Patient has had a recent decline in their functional status and demonstrates the ability to make significant improvements in function in a reasonable and predictable amount of time.     Precautions / Restrictions Precautions Precautions: Fall Recall of Precautions/Restrictions: Impaired Restrictions Weight Bearing Restrictions Per Provider Order: No      Mobility  Bed Mobility Overal bed mobility: Needs Assistance Bed Mobility: Supine to Sit, Sit to  Supine     Supine to sit: Supervision Sit to supine: Supervision   General bed mobility comments: follows commands well. Once seated, upright posture, no dizziness noted    Transfers Overall transfer level: Needs assistance Equipment used: Quad cane Transfers: Sit to/from Stand Sit to Stand: Contact guard assist           General transfer comment: pt unfamiliar with AD. initial mobility with QC, however progressed to mobility with RW. Remains CGA for safety    Ambulation/Gait Ambulation/Gait assistance: Contact guard assist Gait Distance (Feet): 150 Feet Assistive device: Rolling walker (2 wheels) Gait Pattern/deviations: Step-through pattern       General Gait Details: ambulated room distance with QC with slight unsteadiness. Further ambulation in hallway with RW. Reciprocal gait pattern and occasional cues for staying closer to RW.  Stairs            Wheelchair Mobility     Tilt Bed    Modified Rankin (Stroke Patients Only)       Balance Overall balance assessment: History of Falls, Needs assistance Sitting-balance support: No upper extremity supported, Feet unsupported Sitting balance-Leahy Scale: Good     Standing balance support: Bilateral upper extremity supported Standing balance-Leahy Scale: Fair                               Pertinent Vitals/Pain Pain Assessment Pain Assessment: No/denies pain    Home Living Family/patient expects to be discharged to:: Private residence Living Arrangements: Other relatives (granddaugther) Available Help at Discharge: Family;Available 24 hours/day Type of Home: House Home Access: Level entry  Home Layout: One level Home Equipment: Agricultural consultant (2 wheels);Rollator (4 wheels);Cane - single point;Shower seat;BSC/3in1      Prior Function Prior Level of Function : Needs assist;History of Falls (last six months)             Mobility Comments: amb with SPC, will hold onto grocery  cart at grocery store ADLs Comments: MOD I with feeding, grooming, dressing; assist for bathing either getting into the shower or with sink bath; assist for IADL from family     Extremity/Trunk Assessment   Upper Extremity Assessment Upper Extremity Assessment: Generalized weakness    Lower Extremity Assessment Lower Extremity Assessment: Generalized weakness    Cervical / Trunk Assessment Cervical / Trunk Assessment: Normal  Communication   Communication Communication: No apparent difficulties    Cognition Arousal: Alert Behavior During Therapy: WFL for tasks assessed/performed   PT - Cognitive impairments: History of cognitive impairments                         Following commands: Intact       Cueing Cueing Techniques: Verbal cues     General Comments General comments (skin integrity, edema, etc.): orthostatic vital signs taken- please refer to flowsheets; pt does not report dizziness    Exercises Other Exercises Other Exercises: ambulated to toilet in room with PT/OT support secondary to line/leads and cognition. Pt able to perform transfers safely and perform hygiene. no dizziness Other Exercises: Orthostatics obtained, see flowsheet for details   Assessment/Plan    PT Assessment Patient needs continued PT services  PT Problem List Decreased safety awareness;Decreased knowledge of use of DME;Decreased cognition;Decreased mobility;Decreased balance;Decreased strength       PT Treatment Interventions DME instruction;Gait training;Therapeutic exercise;Balance training    PT Goals (Current goals can be found in the Care Plan section)  Acute Rehab PT Goals Patient Stated Goal: to go home PT Goal Formulation: With patient/family Time For Goal Achievement: 08/07/23 Potential to Achieve Goals: Good    Frequency Min 1X/week     Co-evaluation PT/OT/SLP Co-Evaluation/Treatment: Yes Reason for Co-Treatment: For patient/therapist safety;To address  functional/ADL transfers PT goals addressed during session: Mobility/safety with mobility OT goals addressed during session: ADL's and self-care       AM-PAC PT "6 Clicks" Mobility  Outcome Measure Help needed turning from your back to your side while in a flat bed without using bedrails?: A Little Help needed moving from lying on your back to sitting on the side of a flat bed without using bedrails?: A Little Help needed moving to and from a bed to a chair (including a wheelchair)?: A Little Help needed standing up from a chair using your arms (e.g., wheelchair or bedside chair)?: A Little Help needed to walk in hospital room?: A Little Help needed climbing 3-5 steps with a railing? : A Lot 6 Click Score: 17    End of Session Equipment Utilized During Treatment: Gait belt Activity Tolerance: Patient tolerated treatment well Patient left: in bed;with family/visitor present Nurse Communication: Mobility status PT Visit Diagnosis: Unsteadiness on feet (R26.81);Muscle weakness (generalized) (M62.81);History of falling (Z91.81);Difficulty in walking, not elsewhere classified (R26.2)    Time: 1610-9604 PT Time Calculation (min) (ACUTE ONLY): 30 min   Charges:   PT Evaluation $PT Eval Low Complexity: 1 Low   PT General Charges $$ ACUTE PT VISIT: 1 Visit         Elizabeth Palau, PT, DPT, GCS [phone]   Jonel Sick 07/24/2023, 2:46 PM

## 2023-07-24 NOTE — TOC Progression Note (Signed)
Transition of Care [facility]) - Progression Note    Patient Details  Name: [name] MRN: [id number] Date of Birth: 1941-01-31  Transition of Care [facility]) CM/SW Contact  Erin Sons, [place] Phone Number: 07/24/2023, 1:45 PM  Clinical Narrative:     Pt to DC home today. CSW met with pt and pt's granddaughter Tamieka bedside. Rozanna Box included Gaynelle Adu, on speaker phone. They are agreeable to [facility], OP palliative, and rolling walker to be arranged.   HH PT/OT/RN arranged with [facility]. OP palliative arranged with [facility]. Rolling walker ordered with [employer].   Expected Discharge Plan: Home w Home Health Services Barriers to Discharge: No Barriers Identified  Expected Discharge Plan and Services     Post Acute Care Choice: Home Health Living arrangements for the past 2 months: Single Family Home Expected Discharge Date: 07/24/23               DME Arranged: Dan Humphreys DME Agency: [facility]       HH Arranged: PT, OT, RN HH Agency: [facility]         Social Determinants of Health (SDOH) Interventions SDOH Screenings   Food Insecurity: Patient Unable To Answer (07/24/2023)  Housing: Patient Unable To Answer (07/24/2023)  Transportation Needs: Patient Unable To Answer (07/24/2023)  Utilities: Patient Unable To Answer (07/24/2023)  Social Connections: Socially Isolated (07/24/2023)  Tobacco Use: Low Risk  (07/24/2023)    Readmission Risk Interventions     No data to display

## 2023-07-24 NOTE — Progress Notes (Signed)
Progress Note  Patient Name: [name] Date of Encounter: 07/24/2023  Primary Cardiologist: Mariah Milling  Subjective   No chest pain, dyspnea, palpitations, dizziness, presyncope, or syncope. Echo pending.   Inpatient Medications    Scheduled Meds:  amLODipine  10 mg Oral Daily   brimonidine  1 drop Both Eyes BID   dorzolamide-timolol  1 drop Both Eyes BID   latanoprost  1 drop Both Eyes QHS   memantine  10 mg Oral BID   sodium chloride flush  3 mL Intravenous Q12H   Continuous Infusions:  PRN Meds: acetaminophen **OR** acetaminophen, ondansetron **OR** ondansetron (ZOFRAN) IV   Vital Signs    Vitals:   07/24/23 0500 07/24/23 0530 07/24/23 0725 07/24/23 0800  BP: 127/85   120/78  Pulse: 63   68  Resp: 17   15  Temp:  98 F (36.7 C) 98.1 F (36.7 C)   TempSrc:  Oral Oral   SpO2: 100%   99%  Weight:      Height:        Intake/Output Summary (Last 24 hours) at 07/24/2023 0843 Last data filed at 07/23/2023 1613 Gross per 24 hour  Intake --  Output 350 ml  Net -350 ml   Filed Weights   07/22/23 1755  Weight: 62 kg    Telemetry    SR with PACs, PVCs, and a 4 beat atrial run - Personally Reviewed  ECG    No new tracings - Personally Reviewed  Physical Exam   GEN: No acute distress.   Neck: No JVD. Cardiac: RRR, I/VI systolic murmur LSB, no rubs, or gallops.  Respiratory: Clear to auscultation bilaterally.  GI: Soft, nontender, non-distended.   MS: No edema; No deformity. Neuro:  Alert and oriented x 3; Nonfocal.  Psych: Normal affect.  Labs    Chemistry Recent Labs  Lab 07/22/23 1758 07/23/23 0129 07/24/23 0355  NA 139  --  140  K 3.5  --  3.6  CL 104  --  107  CO2 24  --  25  GLUCOSE 153*  --  87  BUN 22  --  17  CREATININE 1.50*  --  1.25*  CALCIUM 9.3  --  9.5  PROT  --  7.3  --   ALBUMIN  --  3.9  --   AST  --  40  --   ALT  --  32  --   ALKPHOS  --  72  --   BILITOT  --  0.8  --   GFRNONAA 35*  --  43*  ANIONGAP 11  --  8      Hematology Recent Labs  Lab 07/22/23 1758 07/24/23 0355  WBC 5.4 6.5  RBC 3.66* 3.79*  HGB 11.2* 11.8*  HCT 34.7* 36.4  MCV 94.8 96.0  MCH 30.6 31.1  MCHC 32.3 32.4  RDW 14.3 14.5  PLT 172 169    Cardiac EnzymesNo results for input(s): "TROPONINI" in the last 168 hours. No results for input(s): "TROPIPOC" in the last 168 hours.   BNPNo results for input(s): "BNP", "PROBNP" in the last 168 hours.   DDimer  Recent Labs  Lab 07/24/23 0355  DDIMER 0.96*     Radiology    MR BRAIN WO CONTRAST Result Date: 07/23/2023 IMPRESSION: 1. No acute intracranial abnormality. 2. Moderate chronic microvascular ischemic changes of the white matter, progressed compared to prior MRI. 3. Moderate parenchymal volume loss, more pronounced in the bilateral parietal regions. 4. Left frontal  scalp swelling extending inferiorly to the level of the zygomatic arch. Electronically Signed   By: Baldemar Lenis M.D.   On: 07/23/2023 13:54   CT Angio Chest/Abd/Pel for Dissection W and/or Wo Contrast Result Date: 07/23/2023 IMPRESSION: 1. No acute finding. 2. 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 3. Large hiatal hernia containing stomach and transverse colon. Electronically Signed   By: Tiburcio Pea M.D.   On: 07/23/2023 06:56   CT Maxillofacial Wo Contrast Result Date: 07/23/2023 CLINICAL DATA:  Blunt facial trauma. Syncopal episode with head injury EXAM: CT MAXILLOFACIAL WITHOUT CONTRAST TECHNIQUE: Multidetector CT imaging of the maxillofacial structures was performed. Multiplanar CT image reconstructions were also generated. RADIATION DOSE REDUCTION: This exam was performed according to the departmental dose-optimization program which includes automated exposure control, adjustment of the mA and/or kV according to patient size and/or use of iterative reconstruction technique. COMPARISON:  03/24/2022 FINDINGS: Osseous: No fracture or mandibular  dislocation. No destructive process. Orbits: Negative. No traumatic or inflammatory finding. Sinuses: Negative for hemosinus. Soft tissues: Scalp swelling on the left.  No opaque foreign body. Limited intracranial: Preceding head CT, no evidence of injury. IMPRESSION: Negative for facial fracture. Electronically Signed   By: Tiburcio Pea M.D.   On: 07/23/2023 06:39   CT Head Wo Contrast Result Date: 07/22/2023 IMPRESSION: 1. No acute intracranial abnormality. No calvarial fracture. Moderate left frontal scalp soft tissue swelling extending inferiorly superficial to the lateral orbital wall. 2. No acute fracture or listhesis of the cervical spine. 3. 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 4. 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   On: 07/22/2023 22:22   CT Cervical Spine Wo Contrast Result Date: 07/22/2023 IMPRESSION: 1. No acute intracranial abnormality. No calvarial fracture. Moderate left frontal scalp soft tissue swelling extending inferiorly superficial to the lateral orbital wall. 2. No acute fracture or listhesis of the cervical spine. 3. 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 4. 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   On: 07/22/2023 22:22    Cardiac Studies   2D echo 12/30/2020: 1. Left ventricular ejection fraction, by estimation, is 50 to 55%. The  left ventricle has low normal function. The left ventricle has no regional  wall motion abnormalities. There is moderate left ventricular hypertrophy.  Left ventricular diastolic  parameters are consistent with Grade I diastolic dysfunction (impaired  relaxation).   2. Right  ventricular systolic function is normal. The right ventricular  size is normal.   3. The mitral valve is normal in structure. Mild mitral valve  regurgitation. No evidence of mitral stenosis.   4. The aortic valve was not well visualized. Aortic valve regurgitation  is mild to moderate.   Patient Profile     83 y.o. 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 Sherryll Burger, MD.   Assessment & Plan    1. Recurrent syncope: -Uncertain etiology at this time, concerning for possible seizure based on history -Not consistent with cardiogenic etiology -Noted to have mild AKI upon presentation, improving -Telemetry notable for sinus rhythm with  brief episode of A-fib with RVR lasting less than 1 minute without significant post termination pause (in the context of advanced age, cognitive decline, and brief episode of A-fib, will defer initiation of anticoagulation at this time) -Obtain echo -Agree with neurology consult; EEG pending -Monitor on telemetry -Outpatient cardiac monitoring -CTA chest/abdomen/pelvis not acute -MRI brain without acute process -She does not drive   2.  Ascending thoracic aortic aneurysm: -Stable, measuring 4.2 cm -Outpatient follow-up   3.  AKI: -Improving       For questions or updates, please contact [facility] Please consult [email or web address] for contact info under Cardiology/STEMI.    Signed, Eula Listen, PA-C [facility] Pager: [phone] 07/24/2023, 8:43 AM

## 2023-07-24 NOTE — Evaluation (Signed)
Occupational Therapy Evaluation Patient Details Name: [name] MRN: [id number] DOB: 1941-06-04 Today's Date: 07/24/2023   History of Present Illness   Pt is an 83 year old female admitted following a syncopal episode     PMH significant for HTN,, high cholesterol, migraine, depression, anxiety, breast cancer and colon cancer  dementia     Clinical Impressions Chart reviewed, pt greeted in ED room with granddaughter present. PTA pt lives with her grand daugther, has 24/7 assist as needed, amb with SPC, performs feeding, grooming, dressing with MOD I, bathing with supervision, has assists for all IADL. Pt presents with deficits in strength, endurance, activity tolerance, cognition, balance affecting safe and optimal ADL completion. Pt will benefit from ongoing OT to address deficits to facilitate optimal ADL performance. See below for further details. Please refer to flowsheets, pt appears to be orthostatic, nurse notified. Pt c/o no symptoms throughout. Pt is left in bed, all needs met. OT will follow acutely.      If plan is discharge home, recommend the following:   A little help with walking and/or transfers;A little help with bathing/dressing/bathroom;Supervision due to cognitive status;Help with stairs or ramp for entrance;Direct supervision/assist for medications management;Direct supervision/assist for financial management;Assistance with cooking/housework     Functional Status Assessment   Patient has had a recent decline in their functional status and demonstrates the ability to make significant improvements in function in a reasonable and predictable amount of time.     Equipment Recommendations   Other (comment) (pt has RW and bsc per family report, recommend using RW and bsc at this time)     Recommendations for Other Services         Precautions/Restrictions   Precautions Precautions: Fall Recall of Precautions/Restrictions:  Impaired Restrictions Weight Bearing Restrictions Per Provider Order: No     Mobility Bed Mobility Overal bed mobility: Needs Assistance Bed Mobility: Supine to Sit, Sit to Supine     Supine to sit: HOB elevated, Supervision Sit to supine: Supervision        Transfers Overall transfer level: Needs assistance Equipment used: Quad cane Transfers: Sit to/from Stand Sit to Stand: Contact guard assist                  Balance Overall balance assessment: Needs assistance Sitting-balance support: Feet supported Sitting balance-Leahy Scale: Good     Standing balance support: Single extremity supported Standing balance-Leahy Scale: Fair                             ADL either performed or assessed with clinical judgement   ADL Overall ADL's : Needs assistance/impaired Eating/Feeding: Set up   Grooming: Supervision/safety;Sitting               Lower Body Dressing: Minimal assistance;Sit to/from stand Lower Body Dressing Details (indicate cue type and reason): brief Toilet Transfer: Contact guard assist;Minimal assistance;Ambulation;Regular Teacher, adult education Details (indicate cue type and reason): MIN A with WBQC, CGA with RW Toileting- Clothing Manipulation and Hygiene: Supervision/safety;Sitting/lateral lean Toileting - Clothing Manipulation Details (indicate cue type and reason): continent of urine on toilet     Functional mobility during ADLs:  (supervision with RW in room and hallway (with PT in hallway))       Vision Patient Visual Report: No change from baseline Additional Comments: vision appears functional, pt reports no vision deficits; L eye swollen     Perception  Praxis         Pertinent Vitals/Pain Pain Assessment Pain Assessment: No/denies pain     Extremity/Trunk Assessment Upper Extremity Assessment Upper Extremity Assessment: Generalized weakness   Lower Extremity Assessment Lower Extremity Assessment:  Generalized weakness   Cervical / Trunk Assessment Cervical / Trunk Assessment: Normal   Communication Communication Communication: No apparent difficulties   Cognition Arousal: Alert Behavior During Therapy: WFL for tasks assessed/performed Cognition: History of cognitive impairments, Cognition impaired   Orientation impairments: Situation Awareness: Intellectual awareness impaired, Online awareness impaired Memory impairment (select all impairments): Short-term memory Attention impairment (select first level of impairment): Sustained attention                     Following commands: Intact       Cueing  General Comments   Cueing Techniques: Verbal cues;Tactile cues;Visual cues      Exercises Other Exercises Other Exercises: edu pt and granddaugther re: role of OT, role of rehab, discharge recommendations, safe use of DME for ADL completion   Shoulder Instructions      Home Living Family/patient expects to be discharged to:: Private residence Living Arrangements: Other relatives (granddaugther) Available Help at Discharge: Family;Available 24 hours/day Type of Home: House Home Access: Level entry     Home Layout: One level     Bathroom Shower/Tub: Walk-in shower         Home Equipment: Agricultural consultant (2 wheels);Rollator (4 wheels);Cane - single point;Shower seat;BSC/3in1          Prior Functioning/Environment Prior Level of Function : Needs assist;History of Falls (last six months)             Mobility Comments: amb with SPC, will hold onto grocery cart at grocery store ADLs Comments: MOD I with feeding, grooming, dressing; assist for bathing either getting into the shower or with sink bath; assist for IADL from family    OT Problem List: Decreased activity tolerance;Decreased knowledge of use of DME or AE;Decreased safety awareness   OT Treatment/Interventions: Self-care/ADL training;Therapeutic exercise;Energy conservation;DME and/or AE  instruction;Therapeutic activities;Patient/family education;Balance training      OT Goals(Current goals can be found in the care plan section)   Acute Rehab OT Goals Patient Stated Goal: go home OT Goal Formulation: With patient Time For Goal Achievement: 08/07/23 Potential to Achieve Goals: Good ADL Goals Pt Will Perform Grooming: with supervision;sitting Pt Will Perform Lower Body Dressing: with supervision Pt Will Transfer to Toilet: with supervision;ambulating Pt Will Perform Toileting - Clothing Manipulation and hygiene: with supervision;sit to/from stand;sitting/lateral leans   OT Frequency:  Min 1X/week    Co-evaluation PT/OT/SLP Co-Evaluation/Treatment: Yes Reason for Co-Treatment: For patient/therapist safety;To address functional/ADL transfers          AM-PAC OT "6 Clicks" Daily Activity     Outcome Measure Help from another person eating meals?: None Help from another person taking care of personal grooming?: None Help from another person toileting, which includes using toliet, bedpan, or urinal?: None Help from another person bathing (including washing, rinsing, drying)?: A Little Help from another person to put on and taking off regular upper body clothing?: None Help from another person to put on and taking off regular lower body clothing?: A Little 6 Click Score: 22   End of Session Equipment Utilized During Treatment: Gait belt;Rolling walker (2 wheels);Other (comment) (quad cane) Nurse Communication: Mobility status  Activity Tolerance: Patient tolerated treatment well Patient left: in bed;with call bell/phone within reach;with family/visitor present  OT Visit  Diagnosis: Other abnormalities of gait and mobility (R26.89)                Time: 4098-1191 OT Time Calculation (min): 30 min Charges:  OT General Charges $OT Visit: 1 Visit OT Evaluation $OT Eval Moderate Complexity: 1 Mod Oleta Mouse, OTD OTR/L  07/24/23, 1:41 PM

## 2023-07-24 NOTE — Progress Notes (Signed)
Daily Progress Note   Patient Name: [name]       Date: 07/24/2023 DOB: [birth year]  Age: 83 y.o. MRN#: [id number] Attending Physician: Delfino Lovett, MD Primary Care Physician: Hillery Aldo, MD Admit Date: 07/23/2023  Reason for Consultation/Follow-up: Establishing goals of care  Subjective: Notes and labs reviewed.  In to see patient.  She is currently sitting in bed with daughter at bedside.  Patient's granddaughter is H POA.  Patient denies complaint.  Patient is hopeful to be able to discharge as soon as workup is completed.  Per granddaughter yesterday, patient is full code/full scope.  Length of Stay: 0  Current Medications: Scheduled Meds:   amLODipine  10 mg Oral Daily   brimonidine  1 drop Both Eyes BID   dorzolamide-timolol  1 drop Both Eyes BID   latanoprost  1 drop Both Eyes QHS   memantine  10 mg Oral BID   sodium chloride flush  3 mL Intravenous Q12H    Continuous Infusions:   PRN Meds: acetaminophen **OR** acetaminophen, ondansetron **OR** ondansetron (ZOFRAN) IV  Physical Exam Pulmonary:     Effort: Pulmonary effort is normal.  Neurological:     Mental Status: She is alert.             Vital Signs: BP 118/82   Pulse 68   Temp 98.1 F (36.7 C) (Oral)   Resp 18   Ht 5' (1.524 m)   Wt 62 kg   SpO2 100%   BMI 26.69 kg/m  SpO2: SpO2: 100 % O2 Device: O2 Device: Room Air O2 Flow Rate:    Intake/output summary:  Intake/Output Summary (Last 24 hours) at 07/24/2023 1218 Last data filed at 07/23/2023 1613 Gross per 24 hour  Intake --  Output 350 ml  Net -350 ml   LBM:   Baseline Weight: Weight: 62 kg Most recent weight: Weight: 62 kg  Patient Active Problem List   Diagnosis Date Noted   Anemia 07/23/2023   History of breast cancer  07/23/2023   History of colon cancer 07/23/2023   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 2019 07/23/2023   Cognitive decline 02/20/2022   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, moderate, with agitation (HCC) 02/20/2022   Dementia with behavioral disturbance (HCC) 01/12/2021   Chronic diarrhea 12/28/2017   Intestinal adhesions with  complete obstruction (HCC)    SBO (small bowel obstruction) (HCC) 12/03/2017   GERD (gastroesophageal reflux disease) 12/03/2017   TBI (traumatic brain injury) (HCC) 02/29/2016   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   Confusional arousals 02/29/2016   Memory loss 02/13/2016   Chronic headaches 02/13/2016   Snoring 02/13/2016   Daytime somnolence 02/13/2016   Bradycardia 03/11/2014   Preop cardiovascular exam 05/22/2012   Syncope and collapse 07/13/2010   Hyperlipidemia 03/29/2010   Chest pain 10/20/2009   Nonspecific (abnormal) findings on radiological and other examination of gastrointestinal tract 11/12/2007   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 Patient is hopeful to be able to discharge home as soon as workup is completed. Granddaughter confirmed full code/full scope yesterday. PMT will shadow as goals are set.  Code Status:    Code Status Orders  (From admission, onward)           Start     Ordered   07/23/23 0446  Full code  Continuous       Question:  By:  Answer:  Other   07/23/23 0447           Code Status History     Date Active Date Inactive Code Status Order ID Comments User Context   12/03/2017 2352 12/08/2017 1856 Full Code [id number]  Oralia Manis, MD Inpatient   08/16/2016 1850 08/17/2016  1518 Full Code [id number]  Milagros Loll, MD ED      Advance Directive Documentation    Flowsheet Row Most Recent Value  Type of Advance Directive Healthcare Power of Attorney  Pre-existing out of facility DNR order (yellow form or pink MOST form) --  "MOST" Form in Place? --      Thank you for allowing the Palliative Medicine Team to assist in the care of this patient.    Morton Stall, NP  Please contact Palliative Medicine Team phone at [phone] for questions and concerns.

## 2023-07-24 NOTE — Procedures (Signed)
Patient Name: [name]  MRN: [id number]  Epilepsy Attending: Charlsie Quest  Referring Physician/Provider: Caryl Pina, MD  Date: 07/24/2023 Duration: 27.10 mins  Patient history: 83yo F with syncope. Eeg to evaluate for seizure  Level of alertness: Awake  AEDs during EEG study: None  Technical aspects: This EEG study was done with scalp electrodes positioned according to the 10-20 International system of electrode placement. Electrical activity was reviewed with band pass filter of 1-70Hz , sensitivity of 7 uV/mm, display speed of 43mm/sec with a 60Hz  notched filter applied as appropriate. EEG data were recorded continuously and digitally stored.  Video monitoring was available and reviewed as appropriate.  Description: The posterior dominant rhythm consists of 9 Hz activity of moderate voltage (25-35 uV) seen predominantly in posterior head regions, symmetric and reactive to eye opening and eye closing. Hyperventilation and photic stimulation were not performed.     IMPRESSION: This study is within normal limits. No seizures or epileptiform discharges were seen throughout the recording.  A normal interictal EEG does not exclude  the diagnosis of epilepsy.   [name] Annabelle Harman

## 2023-07-24 NOTE — Care Management Obs Status (Signed)
MEDICARE OBSERVATION STATUS NOTIFICATION   Patient Details  Name: [name] MRN: [id number] Date of Birth: [birth year]   Medicare Observation Status Notification Given:  Orland Dec, CMA 07/24/2023, 9:55 AM

## 2023-07-24 NOTE — Progress Notes (Signed)
 Eeg done

## 2023-07-25 ENCOUNTER — Other Ambulatory Visit: Payer: Self-pay

## 2023-07-25 ENCOUNTER — Telehealth: Payer: Self-pay | Admitting: *Deleted

## 2023-07-25 ENCOUNTER — Ambulatory Visit: Payer: 59 | Attending: Internal Medicine

## 2023-07-25 DIAGNOSIS — R55 Syncope and collapse: Secondary | ICD-10-CM

## 2023-07-25 NOTE — Telephone Encounter (Signed)
ZIO AT ordered for 14 days.   Will await for scheduling to schedule follow up in one month.   Thanks!

## 2023-07-25 NOTE — Telephone Encounter (Signed)
-----   Message from [place] sent at 07/24/2023  5:24 PM EST ----- Regarding: Monitor and f/u Hello,  It looks like this patient was discharged from the hospital today.  Could you arrange for her to wear a ZIO AT monitor for 14 days and then follow-up with [place] in the office in about a month?  Thanks.  Thayer Ohm

## 2023-07-26 NOTE — Telephone Encounter (Signed)
Reviewed the patient's chart.  She is scheduled to follow up on 09/11/23 with Eula Listen, PA.  Closing this encounter.

## 2023-07-26 NOTE — Discharge Summary (Signed)
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 female with history of recurrent syncope, vertigo, ascending aortic aneurysm measuring 4.2 cm, breast and colon cancer, HTN, HLD admitted for recurrent syncope   * Syncope and collapse History of prior syncope Etiology uncertain, could be vasovagal EEG not showing any seizure although can't r/o  -Noted to have mild AKI upon presentation, improving with hydration -Telemetry notable for sinus rhythm with brief episode of A-fib with RVR lasting less than 1 minute without significant post termination pause (in the context of advanced age, cognitive decline, and brief episode of A-fib, cardio defers initiation of anticoagulation at this time - Echocardiogram shows LVEF 50-55% with grade 1 diastolic dysfunction. No significant valvular abnormalities noted.  -CTA chest/abdomen/pelvis not acute -MRI brain without acute process -She does not drive - Neuro also seen. 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: -Stable, measuring 4.2 cm -Outpatient follow-up  History of breast cancer History of colon cancer Anemia of chronic dz Dementia with behavioral disturbance (HCC) Essential hypertension Anxiety and depression          Consultants: Palliative  care  Disposition: Home health Diet recommendation:  Discharge Diet Orders (From admission, onward)     Start     Ordered   07/24/23 0000  Diet - low sodium heart healthy        07/24/23 1311           Carb modified diet DISCHARGE MEDICATION: Allergies as of 07/24/2023       Reactions   Iodinated Contrast Media Hives   Morphine Other (See Comments)   confusion Other Reaction(s): confusion   Iohexol Hives    Code: HIVES, Desc: PTS GRANDDAUGHTER [name] CALLED 12/31/08 TO NOTIFY us THAT HER GRANDMOTHER HAD A REACTION TO THE IV DYE 8 HRS POST CT EXAM PT DEVELOPED HIVES/RASH.12/31/08/RM, Onset Date: [id number]        Medication List     STOP taking these medications    dorzolamide 2 % ophthalmic solution Commonly known as: TRUSOPT   timolol 0.5 % ophthalmic solution Commonly known as: TIMOPTIC       TAKE these medications    alendronate 70 MG tablet Commonly known as: FOSAMAX alendronate 70 mg tablet  TAKE 1 TABLET BY MOUTH ONCE A WEEK   amLODipine 10 MG tablet Commonly known as: NORVASC Take 10 mg by mouth daily.   aspirin EC 81 MG tablet Take 81 mg by mouth daily.   brimonidine 0.2 % ophthalmic solution Commonly known as: ALPHAGAN Place 1 drop into both eyes 2 (two) times daily.   dorzolamide-timolol 2-0.5 % ophthalmic solution Commonly known as: COSOPT Place 1 drop into both eyes 2 (two) times daily.   famotidine 20 MG tablet Commonly known as: PEPCID Take 1 tablet (20 mg  total) by mouth daily.   latanoprost 0.005 % ophthalmic solution Commonly known as: XALATAN Place 1 drop into both eyes at bedtime.   memantine 10 MG tablet Commonly known as: NAMENDA TAKE 1 TABLET BY MOUTH TWICE DAILY.  PATIENT NEEDS AN OFFICE VISIT FOR FUTURE RX REFILLS   sertraline 100 MG tablet Commonly known as: ZOLOFT Take 200 mg by mouth daily.        Follow-up Information     Hillery Aldo, MD. Schedule an appointment as soon as possible for a visit in 1 week(s).    Specialty: Family Medicine Why: [facility] Discharge F/UP Contact information: [address] [place] [address] [phone]         Antonieta Iba, MD. Schedule an appointment as soon as possible for a visit in 2 week(s).   Specialty: Cardiology Why: [facility] Discharge F/UP Contact information: [address] [place] [address] [phone]         Lonell Face, MD. Schedule an appointment as soon as possible for a visit in 4 week(s).   Specialty: Neurology Why: [facility] Discharge F/UP Contact information: [address] [facility] [place] [address] [phone]         Care, [facility] Follow up.   Specialty: Home Health Services Why: Home Health PT/OT/RN arranged with [facility]. They will call you after discharge to schedule a time to come ot the home. Contact information: [address] [place] [address] [phone]         [facility] Follow up.   Why: Outpatient Palliative services has been arranged with [facility]. They will call you to schedule an appointment. Contact information: [address] [address] [phone]               Discharge Exam: Ceasar Mons Weights   07/22/23 1755  Weight: 62 kg   GEN: No acute distress.   Neck: No JVD. Cardiac: RRR, I/VI systolic murmur LSB, no rubs, or gallops.  Respiratory: Clear to auscultation bilaterally.  GI: Soft, nontender, non-distended.   MS: No edema; No deformity. Neuro:  Alert and oriented x 3; Nonfocal.  Psych: Normal affect.  Condition at discharge: fair  The results of significant diagnostics from this hospitalization (including imaging, microbiology, ancillary and laboratory) are listed below for reference.   Imaging Studies: ECHOCARDIOGRAM COMPLETE Result Date: 07/24/2023    ECHOCARDIOGRAM REPORT   Patient Name:   LESLYN MONDA Turrubiates Date of Exam:  07/24/2023 Medical Rec #:  [id number]        Height:       60.0 in Accession #:    [id number]       Weight:       136.7 lb Date of Birth:  07-13-1940       BSA:          1.588 m Patient Age:    82 years         BP:           127/85 mmHg Patient Gender: F                HR:           63 bpm. Exam Location:  [facility] Procedure: 2D Echo, Cardiac Doppler and Color Doppler (Both Spectral and Color            Flow Doppler were utilized during procedure). Indications:     Syncope R55  History:  Patient has prior history of Echocardiogram examinations, most                  recent 01/01/2021.  Sonographer:     Cristela Blue Referring Phys:  [id number] Andris Baumann Diagnosing Phys: Yvonne Kendall MD  Sonographer Comments: Technically challenging study due to limited acoustic windows, no parasternal window and no subcostal window. IMPRESSIONS  1. Left ventricular ejection fraction, by estimation, is 50 to 55%. The left ventricle has low normal function. Left ventricular endocardial border not optimally defined to evaluate regional wall motion. Left ventricular diastolic parameters are consistent with Grade I diastolic dysfunction (impaired relaxation).  2. Right ventricular systolic function is normal. The right ventricular size is normal. Tricuspid regurgitation signal is inadequate for assessing PA pressure.  3. Left atrial size was mildly dilated.  4. The mitral valve is grossly normal. Trivial mitral valve regurgitation. No evidence of mitral stenosis.  5. The aortic valve was not well visualized. Aortic valve regurgitation is mild to moderate. No aortic stenosis is present. FINDINGS  Left Ventricle: Left ventricular ejection fraction, by estimation, is 50 to 55%. The left ventricle has low normal function. Left ventricular endocardial border not optimally defined to evaluate regional wall motion. Strain imaging was not performed. The left ventricular internal cavity size was normal in size. Suboptimal image quality limits  for assessment of left ventricular hypertrophy. Left ventricular diastolic parameters are consistent with Grade I diastolic dysfunction (impaired relaxation). Right Ventricle: The right ventricular size is normal. Right vetricular wall thickness was not well visualized. Right ventricular systolic function is normal. Tricuspid regurgitation signal is inadequate for assessing PA pressure. Left Atrium: Left atrial size was mildly dilated. Right Atrium: Right atrial size was normal in size. Pericardium: The pericardium was not well visualized. Mitral Valve: The mitral valve is grossly normal. Trivial mitral valve regurgitation. No evidence of mitral valve stenosis. MV peak gradient, 4.0 mmHg. The mean mitral valve gradient is 2.0 mmHg. Tricuspid Valve: The tricuspid valve is not well visualized. Tricuspid valve regurgitation is trivial. Aortic Valve: The aortic valve was not well visualized. Aortic valve regurgitation is mild to moderate. Aortic regurgitation PHT measures 717 msec. No aortic stenosis is present. Aortic valve mean gradient measures 2.5 mmHg. Aortic valve peak gradient measures 5.2 mmHg. Aortic valve area, by VTI measures 2.49 cm. Pulmonic Valve: The pulmonic valve was not well visualized. Aorta: The aortic root was not well visualized. IAS/Shunts: The interatrial septum was not well visualized. Additional Comments: 3D imaging was not performed.  LEFT VENTRICLE PLAX 2D LVIDd:         4.10 cm   Diastology LVIDs:         3.00 cm   LV e' medial:    3.70 cm/s LV PW:         1.00 cm   LV E/e' medial:  13.9 LV IVS:        1.90 cm   LV e' lateral:   5.77 cm/s LVOT diam:     2.10 cm   LV E/e' lateral: 8.9 LV SV:         54 LV SV Index:   34 LVOT Area:     3.46 cm  RIGHT VENTRICLE RV Basal diam:  2.40 cm RV Mid diam:    2.20 cm LEFT ATRIUM             Index        RIGHT ATRIUM  Index LA diam:        3.40 cm 2.14 cm/m   RA Area:     15.40 cm LA Vol (A2C):   58.1 ml 36.59 ml/m  RA Volume:   39.80 ml   25.07 ml/m LA Vol (A4C):   53.1 ml 33.44 ml/m LA Biplane Vol: 60.4 ml 38.04 ml/m  AORTIC VALVE AV Area (Vmax):    2.66 cm AV Area (Vmean):   2.64 cm AV Area (VTI):     2.49 cm AV Vmax:           114.50 cm/s AV Vmean:          74.800 cm/s AV VTI:            0.216 m AV Peak Grad:      5.2 mmHg AV Mean Grad:      2.5 mmHg LVOT Vmax:         87.80 cm/s LVOT Vmean:        57.000 cm/s LVOT VTI:          0.155 m LVOT/AV VTI ratio: 0.72 AI PHT:            717 msec MITRAL VALVE MV Area (PHT): 7.44 cm    SHUNTS MV Area VTI:   2.40 cm    Systemic VTI:  0.16 m MV Peak grad:  4.0 mmHg    Systemic Diam: 2.10 cm MV Mean grad:  2.0 mmHg MV Vmax:       1.00 m/s MV Vmean:      60.1 cm/s MV Decel Time: 102 msec MV E velocity: 51.40 cm/s MV A velocity: 91.70 cm/s MV E/A ratio:  0.56 Cristal Deer End MD Electronically signed by Yvonne Kendall MD Signature Date/Time: 07/24/2023/4:11:48 PM    Final    EEG adult Result Date: 07/24/2023 Charlsie Quest, MD     07/24/2023  2:27 PM Patient Name: REISHA WOS MRN: [id number] Epilepsy Attending: Charlsie Quest Referring Physician/Provider: Caryl Pina, MD Date: 07/24/2023 Duration: 27.10 mins Patient history: 83yo F with syncope. Eeg to evaluate for seizure Level of alertness: Awake AEDs during EEG study: None Technical aspects: This EEG study was done with scalp electrodes positioned according to the 10-20 International system of electrode placement. Electrical activity was reviewed with band pass filter of 1-70Hz , sensitivity of 7 uV/mm, display speed of 94mm/sec with a 60Hz  notched filter applied as appropriate. EEG data were recorded continuously and digitally stored.  Video monitoring was available and reviewed as appropriate. Description: The posterior dominant rhythm consists of 9 Hz activity of moderate voltage (25-35 uV) seen predominantly in posterior head regions, symmetric and reactive to eye opening and eye closing. Hyperventilation and photic stimulation were not  performed.   IMPRESSION: This study is within normal limits. No seizures or epileptiform discharges were seen throughout the recording. A normal interictal EEG does not exclude  the diagnosis of epilepsy. Charlsie Quest   MR BRAIN WO CONTRAST Result Date: 07/23/2023 CLINICAL DATA:  Mental status change, unknown cause. EXAM: MRI HEAD WITHOUT CONTRAST TECHNIQUE: Multiplanar, multiecho pulse sequences of the brain and surrounding structures were obtained without intravenous contrast. COMPARISON:  Head CT July 22, 2023. MRI of the brain February 09, 2017. FINDINGS: Brain: No acute infarction, hemorrhage, hydrocephalus, extra-axial collection or mass lesion. Scattered and confluent foci of T2 hyperintensity are seen within the white matter of the cerebral hemispheres and within the pons, nonspecific, most likely related to chronic small vessel ischemia, progressed compared to prior  MRI. Moderate parenchymal volume loss, more pronounced in the bilateral parietal regions. 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 Vascular: Normal flow voids. Skull and upper cervical spine: Normal marrow signal. Sinuses/Orbits: Negative. Other: Left frontal scalp swelling extending inferiorly to the level of the zygomatic arch. IMPRESSION: 1. No acute intracranial abnormality. 2. Moderate chronic microvascular ischemic changes of the white matter, progressed compared to prior MRI. 3. Moderate parenchymal volume loss, more pronounced in the bilateral parietal regions. 4. Left frontal scalp swelling extending inferiorly to the level of the zygomatic arch. Electronically Signed   By: Baldemar Lenis M.D.   On: 07/23/2023 13:54   CT Angio Chest/Abd/Pel for Dissection W and/or Wo Contrast Result Date: 07/23/2023 CLINICAL DATA:  Aortic aneurysm suspected, syncope. EXAM: CT ANGIOGRAPHY CHEST, ABDOMEN AND PELVIS TECHNIQUE: Non-contrast CT of the chest was initially obtained. Multidetector CT  imaging through the chest, abdomen and pelvis was performed using the standard protocol during bolus administration of intravenous contrast. Multiplanar reconstructed images and MIPs were obtained and reviewed to evaluate the vascular anatomy. RADIATION DOSE REDUCTION: This exam was performed according to the departmental dose-optimization program which includes automated exposure control, adjustment of the mA and/or kV according to patient size and/or use of iterative reconstruction technique. CONTRAST:  OMNIPAQUE IOHEXOL 350 MG/ML SOLN COMPARISON:  Abdominal CT 05/14/2019 FINDINGS: CTA CHEST FINDINGS Cardiovascular: Preferential opacification of the thoracic aorta. No evidence of thoracic aortic aneurysm or dissection. Normal heart size. No pericardial effusion. The aorta and great vessels are tortuous any long gated. The aorta measures up to 4.2 cm in diameter at the ascending segment and 3.5 cm at the level just beyond the isthmus. Mediastinum/Nodes: Large hiatal hernia containing stomach and transverse colon. No mass or adenopathy. Lungs/Pleura: The central airways are clear. Scarring in the left lower lobe adjacent to the large hiatal hernia. Mild cylindrical bronchiectasis in the right lower lobe. Musculoskeletal: Generalized degeneration with thoracolumbar scoliosis. No acute or aggressive finding Review of the MIP images confirms the above findings. CTA ABDOMEN AND PELVIS FINDINGS VASCULAR Aorta: Atheromatous plaque. No dissection, inflammation, or aneurysmal dilatation. Celiac: Broad celiac at its bifurcation. No branch occlusion or flow reducing stenosis SMA: Diffusely patent.  Undulating lumen without definite beading. Renals: Calcified plaque at the ostia without significant stenosis. No beading, dissection, aneurysm, or proximal stenosis. IMA: Patent Inflow: Atheromatous plaque Veins: Unremarkable Review of the MIP images confirms the above findings. NON-VASCULAR Hepatobiliary: No focal liver  abnormality.Cholecystectomy which likely accounts for chronic biliary dilatation. Pancreas: Generalized atrophy.  No acute or focal finding Spleen: Unremarkable. Adrenals/Urinary Tract: Negative adrenals. No hydronephrosis or ureteral stone. Bilateral renal cystic densities. Calcification which could be vascular or urinary at the left hilum measuring 3 mm. Unremarkable bladder. Stomach/Bowel: Proximal colectomy with anastomosis that is unremarkable. Lymphatic: No mass or adenopathy. Reproductive:No pathologic findings. Other: No ascites or pneumoperitoneum. Musculoskeletal: No acute abnormalities. Generalized degeneration with scoliosis. Review of the MIP images confirms the above findings. IMPRESSION: 1. No acute finding. 2. 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 3. Large hiatal hernia containing stomach and transverse colon. Electronically Signed   By: Tiburcio Pea M.D.   On: 07/23/2023 06:56   CT Maxillofacial Wo Contrast Result Date: 07/23/2023 CLINICAL DATA:  Blunt facial trauma. Syncopal episode with head injury EXAM: CT MAXILLOFACIAL WITHOUT CONTRAST TECHNIQUE: Multidetector CT imaging of the maxillofacial structures was performed. Multiplanar CT image reconstructions were also generated. RADIATION DOSE REDUCTION: This  exam was performed according to the departmental dose-optimization program which includes automated exposure control, adjustment of the mA and/or kV according to patient size and/or use of iterative reconstruction technique. COMPARISON:  03/24/2022 FINDINGS: Osseous: No fracture or mandibular dislocation. No destructive process. Orbits: Negative. No traumatic or inflammatory finding. Sinuses: Negative for hemosinus. Soft tissues: Scalp swelling on the left.  No opaque foreign body. Limited intracranial: Preceding head CT, no evidence of injury. IMPRESSION: Negative for facial fracture. Electronically Signed   By: Tiburcio Pea M.D.   On: 07/23/2023  06:39   CT Head Wo Contrast Result Date: 07/22/2023 CLINICAL DATA:  Head trauma, minor (Age >= 65y); Neck trauma (Age >= 65y), syncope, fall EXAM: CT HEAD WITHOUT CONTRAST CT CERVICAL SPINE WITHOUT CONTRAST TECHNIQUE: Multidetector CT imaging of the head and cervical spine was performed following the standard protocol without intravenous contrast. Multiplanar CT image reconstructions of the cervical spine were also generated. RADIATION DOSE REDUCTION: This exam was performed according to the departmental dose-optimization program which includes automated exposure control, adjustment of the mA and/or kV according to patient size and/or use of iterative reconstruction technique. COMPARISON:  None Available. FINDINGS: CT HEAD FINDINGS Brain: Normal anatomic configuration. No abnormal intra or extra-axial mass lesion or fluid collection. No abnormal mass effect or midline shift. No evidence of acute intracranial hemorrhage or infarct. Ventricular size is normal. Cerebellum unremarkable. Vascular: Unremarkable Skull: Intact Sinuses/Orbits: Paranasal sinuses are clear. Orbits are unremarkable. Other: Mastoid air cells and middle ear cavities are clear. Moderate left frontal scalp soft tissue swelling extending inferiorly superficial to the lateral orbital wall. CT CERVICAL SPINE FINDINGS Alignment: Normal. Skull base and vertebrae: Craniocervical alignment is normal. The atlantodental interval is not widened. No acute fracture of the cervical spine. Vertebral body height is preserved. Soft tissues and spinal canal: No prevertebral fluid or swelling. No visible canal hematoma. Disc levels: There is intervertebral disc space narrowing and endplate remodeling throughout the cervical spine in keeping with changes advanced degenerative disc disease. Prevertebral soft tissues are not thickened on sagittal reformats. The spinal canal is widely patent. Multilevel uncovertebral arthrosis and to a lesser extent facet arthrosis  in combination with disc height loss results in severe bilateral neuroforaminal narrowing at C3-C6 with moderate narrowing noted at C6-7 bilaterally. Upper chest: The aortic arch is dilated measuring 4.2 cm in diameter, similar to prior examination of 08/16/2016. No acute abnormality Other: None IMPRESSION: 1. No acute intracranial abnormality. No calvarial fracture. Moderate left frontal scalp soft tissue swelling extending inferiorly superficial to the lateral orbital wall. 2. No acute fracture or listhesis of the cervical spine. 3. 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 4. 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   On: 07/22/2023 22:22   CT Cervical Spine Wo Contrast Result Date: 07/22/2023 CLINICAL DATA:  Head trauma, minor (Age >= 65y); Neck trauma (Age >= 65y), syncope, fall EXAM: CT HEAD WITHOUT CONTRAST CT CERVICAL SPINE WITHOUT CONTRAST TECHNIQUE: Multidetector CT imaging of the head and cervical spine was performed following the standard protocol without intravenous contrast. Multiplanar CT image reconstructions of the cervical spine were also generated. RADIATION DOSE REDUCTION: This exam was performed according to the departmental dose-optimization program which includes automated exposure control, adjustment of the mA and/or kV according to patient size and/or use of iterative reconstruction technique. COMPARISON:  None Available. FINDINGS: CT HEAD FINDINGS Brain: Normal  anatomic configuration. No abnormal intra or extra-axial mass lesion or fluid collection. No abnormal mass effect or midline shift. No evidence of acute intracranial hemorrhage or infarct. Ventricular size is normal. Cerebellum unremarkable. Vascular: Unremarkable Skull: Intact Sinuses/Orbits: Paranasal sinuses are clear. Orbits are unremarkable.  Other: Mastoid air cells and middle ear cavities are clear. Moderate left frontal scalp soft tissue swelling extending inferiorly superficial to the lateral orbital wall. CT CERVICAL SPINE FINDINGS Alignment: Normal. Skull base and vertebrae: Craniocervical alignment is normal. The atlantodental interval is not widened. No acute fracture of the cervical spine. Vertebral body height is preserved. Soft tissues and spinal canal: No prevertebral fluid or swelling. No visible canal hematoma. Disc levels: There is intervertebral disc space narrowing and endplate remodeling throughout the cervical spine in keeping with changes advanced degenerative disc disease. Prevertebral soft tissues are not thickened on sagittal reformats. The spinal canal is widely patent. Multilevel uncovertebral arthrosis and to a lesser extent facet arthrosis in combination with disc height loss results in severe bilateral neuroforaminal narrowing at C3-C6 with moderate narrowing noted at C6-7 bilaterally. Upper chest: The aortic arch is dilated measuring 4.2 cm in diameter, similar to prior examination of 08/16/2016. No acute abnormality Other: None IMPRESSION: 1. No acute intracranial abnormality. No calvarial fracture. Moderate left frontal scalp soft tissue swelling extending inferiorly superficial to the lateral orbital wall. 2. No acute fracture or listhesis of the cervical spine. 3. 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 4. 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   On: 07/22/2023 22:22    Microbiology: Results for orders placed or performed in visit on 02/20/22  Microscopic Examination     Status: None   Collection Time: 02/20/22 11:24 AM  Result Value Ref Range Status   WBC, UA 0-5 0 - 5 /hpf Final   RBC, Urine None seen 0 - 2 /hpf Final    Epithelial Cells (non renal) 0-10 0 - 10 /hpf Final   Casts None seen None seen /lpf Final   Bacteria, UA Few None seen/Few Final  Urine Culture     Status: None   Collection Time: 02/20/22 11:24 AM   BL  Result Value Ref Range Status   Urine Culture, Routine Final report  Final   Organism ID, Bacteria Comment  Final    Comment: Mixed urogenital flora Less than 10,000 colonies/mL     Labs: CBC: Recent Labs  Lab 07/22/23 1758 07/24/23 0355  WBC 5.4 6.5  HGB 11.2* 11.8*  HCT 34.7* 36.4  MCV 94.8 96.0  PLT 172 169   Basic Metabolic Panel: Recent Labs  Lab 07/22/23 1758 07/23/23 0129 07/24/23 0355  NA 139  --  140  K 3.5  --  3.6  CL 104  --  107  CO2 24  --  25  GLUCOSE 153*  --  87  BUN 22  --  17  CREATININE 1.50*  --  1.25*  CALCIUM 9.3  --  9.5  MG  --  2.4  --    Liver Function Tests: Recent Labs  Lab 07/23/23 0129  AST 40  ALT 32  ALKPHOS 72  BILITOT 0.8  PROT 7.3  ALBUMIN 3.9   CBG: Recent Labs  Lab 07/23/23 0551 07/23/23 0737  GLUCAP 113* 116*    Discharge time spent: greater than 30 minutes.  Signed: Delfino Lovett, MD [facility] 07/26/2023

## 2023-07-31 DIAGNOSIS — R55 Syncope and collapse: Secondary | ICD-10-CM | POA: Diagnosis not present

## 2023-08-08 ENCOUNTER — Telehealth: Payer: Self-pay | Admitting: Adult Health

## 2023-08-08 NOTE — Telephone Encounter (Signed)
 LVM and sent mychart msg- NP out 3/6.

## 2023-08-09 ENCOUNTER — Inpatient Hospital Stay: Payer: 59 | Admitting: Adult Health

## 2023-08-28 ENCOUNTER — Encounter: Payer: Self-pay | Admitting: Neurology

## 2023-08-28 ENCOUNTER — Ambulatory Visit (INDEPENDENT_AMBULATORY_CARE_PROVIDER_SITE_OTHER): Admitting: Neurology

## 2023-08-28 VITALS — BP 107/64 | HR 56 | Ht 65.0 in | Wt 140.0 lb

## 2023-08-28 DIAGNOSIS — R55 Syncope and collapse: Secondary | ICD-10-CM | POA: Diagnosis not present

## 2023-08-28 DIAGNOSIS — F03B11 Unspecified dementia, moderate, with agitation: Secondary | ICD-10-CM | POA: Diagnosis not present

## 2023-08-28 DIAGNOSIS — W19XXXA Unspecified fall, initial encounter: Secondary | ICD-10-CM | POA: Diagnosis not present

## 2023-08-28 MED ORDER — MEMANTINE HCL 10 MG PO TABS: 10.0000 mg | ORAL_TABLET | Freq: Two times a day (BID) | ORAL | 4 refills | Status: AC

## 2023-08-28 NOTE — Progress Notes (Signed)
 [facility]    Provider:  Dr Lucia Gaskins Referring Provider: Hillery Aldo, MD Primary Care Physician:  Hillery Aldo, MD  CC:  dementia  08/28/2023: Hospital follow-up for principal problem syncope and collapse.  She also has a past medical history of dilatation of the aortic arch, anxiety, depression, hypertension, chronic headaches, TBI, dementia with behavioral disturbances, anemia, history of breast and colon cancer.  I reviewed Dr. Patricia Pesa notes from discharge summary July 24, 2023: EEG did not show any seizure for recurrent syncope, vertigo, noted to have mild acute kidney injury upon presentation and improving with hydration, telemetry was notable for sinus rhythm with brief episodes of A-fib with RVR lasting less than 1 minute without significant posttermination pauses, cardio defers initiation of anticoagulation at this time in the context of advanced age cognitive decline and brief episode of A-fib.  Echocardiogram was unremarkable.  CTA chest abdomen pelvis nonacute.  MRI brain without acute process.  I reviewed Dr. 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, syncope and collapse in addition to the above she had a syncopal episode at home loss of consciousness for 5 minutes followed by a semiresponsive state which her grand daughter was able to move her up into a chair, she was at her baseline but feeling ill after her trip shopping when she walked past the granddaughter in the hallway to her bedroom stating she was not feeling well and had to go to the bathroom, which she passed out, granddaughter states that patient's eyes suddenly close she lost consciousness fell sideways into the wall and slid to the floor and struck the left side of her head on the hardwood floor, unarousable for 5 minutes, incontinent of  stool which was noticed right after she fell, she did not exhibit any seizure-like activity.  grandDaughter here with patient, she lives with patient and cares her very well patient appears happy. Her memory is worsening, discussed her stay, she states she was walking down the hall without any walking aid and she was at her daughter's door and told her she was going to the bathroom and lie down then something happened her face switched, she just fell backwards and went straight down face first on the hardwood floors. EEGs were negative. She was pale, not responding, her eyes were closed, had a bowel movement, she had LOC for several minutes, no abnormal movements, her eyes were closed and she got her on the toilet and patient remembers daughter holding her during loss of consciousness.  She drinks  a lot of caffeinated pepsis and daughter knocked that out and taking pedialyte and ensure. No episodes since. Discussed caregiver burnout, granddaughter gets teary.  Personally reviewed images and agree with the following:  07/23/2023: MRi brain  CLINICAL DATA:  Mental status change, unknown cause.   EXAM: MRI HEAD WITHOUT CONTRAST   TECHNIQUE: Multiplanar, multiecho pulse sequences of the brain and surrounding structures were obtained without intravenous contrast.   COMPARISON:  Head CT July 22, 2023. MRI of the brain February 09, 2017.   FINDINGS: Brain: No acute infarction, hemorrhage, hydrocephalus, extra-axial collection or mass lesion. Scattered and confluent foci of T2 hyperintensity are seen within the white matter of the cerebral hemispheres and within the pons, nonspecific, most likely related to chronic small vessel ischemia, progressed compared to prior MRI. Moderate parenchymal volume loss, more pronounced in the bilateral parietal regions. Small remote  infarct in the deep white matter of the left parietal lobe.   Vascular: Normal flow voids.   Skull and upper cervical  spine: Normal marrow signal.   Sinuses/Orbits: Negative.   Other: Left frontal scalp swelling extending inferiorly to the level of the zygomatic arch.   IMPRESSION: 1. No acute intracranial abnormality. 2. Moderate chronic microvascular ischemic changes of the white matter, progressed compared to prior MRI. 3. Moderate parenchymal volume loss, more pronounced in the bilateral parietal regions. 4. Left frontal scalp swelling extending inferiorly to the level of the zygomatic arch.  Reviewed bloodwork: Recent Results (from the past 2160 hours)  Basic metabolic panel     Status: Abnormal   Collection Time: 07/22/23  5:58 PM  Result Value Ref Range   Sodium 139 135 - 145 mmol/L   Potassium 3.5 3.5 - 5.1 mmol/L   Chloride 104 98 - 111 mmol/L   CO2 24 22 - 32 mmol/L   Glucose, Bld 153 (H) 70 - 99 mg/dL    Comment: Glucose reference range applies only to samples taken after fasting for at least 8 hours.   BUN 22 8 - 23 mg/dL   Creatinine, Ser 1.61 (H) 0.44 - 1.00 mg/dL   Calcium 9.3 8.9 - 09.6 mg/dL   GFR, Estimated 35 (L) >60 mL/min    Comment: (NOTE) Calculated using the CKD-EPI Creatinine Equation (2021)    Anion gap 11 5 - 15    Comment: Performed at [facility], [address] [place], [place] [address]  CBC     Status: Abnormal   Collection Time: 07/22/23  5:58 PM  Result Value Ref Range   WBC 5.4 4.0 - 10.5 K/uL   RBC 3.66 (L) 3.87 - 5.11 MIL/uL   Hemoglobin 11.2 (L) 12.0 - 15.0 g/dL   HCT 98.1 (L) 19.1 - 47.8 %   MCV 94.8 80.0 - 100.0 fL   MCH 30.6 26.0 - 34.0 pg   MCHC 32.3 30.0 - 36.0 g/dL   RDW 29.5 62.1 - 30.8 %   Platelets 172 150 - 400 K/uL   nRBC 0.0 0.0 - 0.2 %    Comment: Performed at [facility], [address], [place], [place] [address]  Troponin I (High Sensitivity)     Status: None   Collection Time: 07/23/23  1:29 AM  Result Value Ref Range   Troponin I (High Sensitivity) 10 <18 ng/L    Comment: (NOTE) Elevated high  sensitivity troponin I (hsTnI) values and significant  changes across serial measurements may suggest ACS but many other  chronic and acute conditions are known to elevate hsTnI results.  Refer to the "Links" section for chest pain algorithms and additional  guidance. Performed at [facility], [address] [place], [place] [address]   Magnesium     Status: None   Collection Time: 07/23/23  1:29 AM  Result Value Ref Range   Magnesium 2.4 1.7 - 2.4 mg/dL    Comment: Performed at [facility], [address] [place], [place] [address]  Hepatic function panel     Status: Abnormal   Collection Time: 07/23/23  1:29 AM  Result Value Ref Range   Total Protein 7.3 6.5 - 8.1 g/dL   Albumin 3.9 3.5 - 5.0 g/dL   AST 40 15 - 41 U/L    Comment: HEMOLYSIS AT THIS LEVEL MAY AFFECT RESULT   ALT 32 0 - 44 U/L    Comment: HEMOLYSIS AT THIS LEVEL MAY  AFFECT RESULT   Alkaline Phosphatase 72 38 - 126 U/L   Total Bilirubin 0.8 0.0 - 1.2 mg/dL    Comment: HEMOLYSIS AT THIS LEVEL MAY AFFECT RESULT   Bilirubin, Direct 0.3 (H) 0.0 - 0.2 mg/dL   Indirect Bilirubin 0.5 0.3 - 0.9 mg/dL    Comment: Performed at [facility], [address] [place], [place] [address]  Lipase, blood     Status: None   Collection Time: 07/23/23  1:29 AM  Result Value Ref Range   Lipase 26 11 - 51 U/L    Comment: Performed at [facility], [address] [place], [place] [address]  Urinalysis, Routine w reflex microscopic -Urine, Clean Catch     Status: Abnormal   Collection Time: 07/23/23  4:23 AM  Result Value Ref Range   Color, Urine YELLOW (A) YELLOW   APPearance CLEAR (A) CLEAR   Specific Gravity, Urine 1.010 1.005 - 1.030   pH 6.0 5.0 - 8.0   Glucose, UA NEGATIVE NEGATIVE mg/dL   Hgb urine dipstick NEGATIVE NEGATIVE   Bilirubin Urine NEGATIVE NEGATIVE   Ketones, ur NEGATIVE NEGATIVE mg/dL   Protein, ur NEGATIVE NEGATIVE mg/dL   Nitrite NEGATIVE NEGATIVE   Leukocytes,Ua  TRACE (A) NEGATIVE   RBC / HPF 0-5 0 - 5 RBC/hpf   WBC, UA 0-5 0 - 5 WBC/hpf   Bacteria, UA NONE SEEN NONE SEEN   Squamous Epithelial / HPF 0-5 0 - 5 /HPF   Mucus PRESENT    Hyaline Casts, UA PRESENT     Comment: Performed at [facility], [address] [place], [place] [address]  Troponin I (High Sensitivity)     Status: None   Collection Time: 07/23/23  4:23 AM  Result Value Ref Range   Troponin I (High Sensitivity) 7 <18 ng/L    Comment: (NOTE) Elevated high sensitivity troponin I (hsTnI) values and significant  changes across serial measurements may suggest ACS but many other  chronic and acute conditions are known to elevate hsTnI results.  Refer to the "Links" section for chest pain algorithms and additional  guidance. Performed at [facility], [address] [place], [place] [address]   CBG monitoring, ED     Status: Abnormal   Collection Time: 07/23/23  5:51 AM  Result Value Ref Range   Glucose-Capillary 113 (H) 70 - 99 mg/dL    Comment: Glucose reference range applies only to samples taken after fasting for at least 8 hours.  CBG monitoring, ED     Status: Abnormal   Collection Time: 07/23/23  7:37 AM  Result Value Ref Range   Glucose-Capillary 116 (H) 70 - 99 mg/dL    Comment: Glucose reference range applies only to samples taken after fasting for at least 8 hours.   Comment 1 Notify RN    Comment 2 Document in Chart   CBC     Status: Abnormal   Collection Time: 07/24/23  3:55 AM  Result Value Ref Range   WBC 6.5 4.0 - 10.5 K/uL   RBC 3.79 (L) 3.87 - 5.11 MIL/uL   Hemoglobin 11.8 (L) 12.0 - 15.0 g/dL   HCT 52.8 41.3 - 24.4 %   MCV 96.0 80.0 - 100.0 fL   MCH 31.1 26.0 - 34.0 pg   MCHC 32.4 30.0 - 36.0 g/dL   RDW 01.0 27.2 - 53.6 %   Platelets 169 150 - 400 K/uL   nRBC 0.0 0.0 - 0.2 %  Comment: Performed at [facility], [address] [place], [place] [address]  Basic metabolic panel     Status: Abnormal   Collection Time:  07/24/23  3:55 AM  Result Value Ref Range   Sodium 140 135 - 145 mmol/L   Potassium 3.6 3.5 - 5.1 mmol/L   Chloride 107 98 - 111 mmol/L   CO2 25 22 - 32 mmol/L   Glucose, Bld 87 70 - 99 mg/dL    Comment: Glucose reference range applies only to samples taken after fasting for at least 8 hours.   BUN 17 8 - 23 mg/dL   Creatinine, Ser 2.95 (H) 0.44 - 1.00 mg/dL   Calcium 9.5 8.9 - 28.4 mg/dL   GFR, Estimated 43 (L) >60 mL/min    Comment: (NOTE) Calculated using the CKD-EPI Creatinine Equation (2021)    Anion gap 8 5 - 15    Comment: Performed at [facility], [address] [place], [place] [address]  D-dimer, quantitative     Status: Abnormal   Collection Time: 07/24/23  3:55 AM  Result Value Ref Range   D-Dimer, Quant 0.96 (H) 0.00 - 0.50 ug/mL-FEU    Comment: (NOTE) At the manufacturer cut-off value of 0.5 g/mL FEU, this assay has a negative predictive value of 95-100%.This assay is intended for use in conjunction with a clinical pretest probability (PTP) assessment model to exclude pulmonary embolism (PE) and deep venous thrombosis (DVT) in outpatients suspected of PE or DVT. Results should be correlated with clinical presentation. Performed at [facility], [address] [place], [place] [address]   ECHOCARDIOGRAM COMPLETE     Status: None   Collection Time: 07/24/23  9:00 AM  Result Value Ref Range   BP 127/85 mmHg   Ao pk vel 1.15 m/s   AV Area VTI 2.49 cm2   AR max vel 2.66 cm2   AV Mean grad 2.5 mmHg   AV Peak grad 5.2 mmHg   S' Lateral 3.00 cm   AV Area mean vel 2.64 cm2   Area-P 1/2 7.44 cm2   P 1/2 time 717 msec   MV VTI 2.40 cm2   Est EF 50 - 55%     Patient complains of symptoms per HPI as well as the following symptoms: dementia . Pertinent negatives and positives per HPI. All others negative   9/18.2023: Here with her granddaughter, followup on demenita, she is on memantine a titration pack. Grandaughter is in and out. When she  is there she gets patient's medication. She cannot tolerate memantine. She vomits with aricept and annot take any medication orally difficulty swallowing we will try and get it approved. Memantine is also making her vomit and diarrhea, she drinks a lot of ensure. Weight is stable. No ckogin. She fell once and hit her head, a month ago, no changes since then, no signs of concusion or ongoing symptoms, will monitor, has been to PT in the past. Needs a life alert. She may need a  memory unit, we discussed today. Discussed [facility] and I gave her the information. Discussed with grandaughter she nees more help. No trouble swallowing. Weight loss and cognitive decline, palliative hospice would be good discussed.   Patient complains of symptoms per HPI as well as the following symptoms: dementia, agitation . Pertinent negatives and positives per HPI. All others negative   Addendum 06/19/2021: FDG Brain Scan did not show alzheimer's or frontotemporal. 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 We discussed with granddaughter and said she would let us know if she wanted to go forward. We discussed with her, she has not given [place] the go ahead so we are at a standstill on whether to go forward. Just Fyi to her care team in cardiology and I did forward this to her primary care as well.  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 She feels the TV is talking to her, delusions/hallucinations, family is here, her MMSE is 19/30 and shows a steady decline, we ordered the FDG PEt Scan but she did not get It completed so we will get it re-approved and send to memory testing to see if this can give [place] an answer, likely alzheimers but given her other symptoms like hallucinations/delusions, personality changes, needs to rule out FTD. MRI of the brain in 2017 was largely normal for age so  will not repeat at this time.    HPI: memory loss.  We seen patient in the past for memory loss but its been over 3 years.  She has a past medical history of traumatic brain injury, hypertension, headaches, Bradycardia, syncope and collapse, daytime somnolence, memory loss, snoring, hyperlipidemia.  In the past we started her on Aricept and she could not tolerate it.  MMSE is in the past and fluctuated between 20-25/30.  Patient had a sleep evaluation in the past without significant findings.  I reviewed notes, when we first saw her in 2017 she was here with her granddaughter and daughter who stated she repeated herself which it started 2 years prior, may be brother in his 13s with memory loss but no history of dementia, it was more short-term memory forgetting things like names and faces.  She was not driving at the time.  She was needing help for bills.  MRI of the brain in the past showed mild atrophy and chronic microvascular ischemic changes with tiny right thalamic focus of chronic hemorrhage and she was advised to monitor blood pressure.  We sent her to PT for gait and safety.  I also reviewed [facility] from [facility], recent labs in March of this year showed a BUN of 14, creatinine 1.24, EGFR 44, vitamin D very low at 21, B12 368, repeat MRI in the past September 2018 was normal for age.  Here with her granddaughter, memory is worsening, granddaughter provides muchinformation, she forgets conversations, patient states she doesn't forget things at home or any accidents in the home, they don;t let her use the stove anymore per granddaughter they unplug the stove, family is there often, more short-term memory loss, progressive, ongoing for at least 5 years, she continues to forget things, she is more angry all the time, less social, personality changes, irritable, not driving. She refuses social, won;t go to to an adult daycare, family pays the bills, unclear family history of  dementia but brother had memory issues and didn;t know who patient was.    MRI brain 2018: reviewed images and agree: IMPRESSION: 1.  No metastatic disease or acute intracranial abnormality. 2. Stable since 2017 and largely unremarkable for age MRI appearance of the brain  Interval history 10/05/2016: She still has headaches sometimes. She have headaches at least 3x a week.  Here with her granddaughter Rodney Booze. She could not tolerate the Aricept. She refuses anymore medications. Could benefit from continued PT. Will send  [facility] back. Wants [facility] same people that were there before.   Interval history 07/06/2016: Patient returns today. MMSE improved from 20/30 to 25/30. She wants to discuss a new problem headaches. She has had a few falls in her home. A toy was on the floor and she just fell on the floor but didn;t hit anything. Sleep study was negative. She has headaches. She has "high pressures in my eyes" and that is being treated. Headaches are in the frontal areas. She has them every day. Ibuprofen helps. She takes it every once in a while. She has been complaining of the headaches and dizziness more frequently. Daughter feels something is different. They are going to see the eye doctor and her primary care she feels she is lightheaded.  She has headaches when she gets up and the headaches are there tea helps. She has always had headaches and sometimes they are worse. She has had headaches all her life. She used to have migraines.She gets agitated. She does drinka  Lot of fluids.    Butch Penny 03/2016: HISTORY OF PRESENT ILLNESS: Ms. Friedt is a 83 year old female with a history of confusional arousals, snoring and sleep disturbance. She returns today to discuss her ONO results. I explained to the patient that Dr. Vickey Huger has reviewed her ONO study and reports that it is normal. She does not qualify for supplemental oxygen at night. Patient verbalized understanding. At the last visit Dr. Vickey Huger  encouraged the patient to use melatonin before bedtime. Her granddaughters with her and reports that they forgot to pick this up. They are willing to give this a try. She was also encouraged to participate in a senior citizen's group. She states that she's been going with a friend to a church group. At this group they do things such as watching movies and eating. Patient overall feels that she is doing okay. In the past she has been given Aricept however this caused vivid dreams. She is no longer on Aricept.     HPI 02/2016:  CHRISTY EHRSAM is a 83 y.o. female here as a referral from Dr. Allena Katz for memory loss. Past medical history of hypertension, high cholesterol, migraine, depression, anxiety, breast cancer and colon cancer. Here with daughter and granddaughter who provide most information. She loses things. She repeats herself. Started 2 years ago and worsening. She had a brother in his 47s maybe with memory loss but also had a stroke, no FHx of Alzheimer's dementia. More short-term memory loss. She forgets things and later remembers it like names. She forgets names and faces. She has forgotten things that they discussed multiple times over the years and patient does not remember. A lot of little things. She lives alone. She does not drive. Patient pays the bills but needs help now. She needs help getting her medications. She does not want to eat. Her husband just dies a year ago and they were married 50 years ago. She has had migraine headaches for years and she has had chronic headaches for years since daughter remembers and now they feel like daily pressure hedaches. Granddaughter thinks she is depressed. Says that patient will repeat things even if she just told them earlier. She hides things and can;t find them. Headaches are in the morning when she wakes up every day. She snores badly per family. Headaches are daily she wakes with them every morning.     Reviewed MRi brain images personally and with  patient and family: Mild atrophy and chronic microvascular  ischemic change. Tiny RIGHT thalamic focus chronic hemorrhage, likely sequelae of hypertensive cerebrovascular disease.   No acute intracranial findings are evident.     Reviewed notes, labs and imaging from outside physicians, which showed:    Labs drawn 12/30/2015: CMP was unremarkable with creatinine 0.91, B12 was 1037, folate 13.2.      Her primary care notes, she has been depressed, flat affect sometimes tearful, doesn't want to talk to counselor may be in the future. Patient has headache on and off. She suspected tension headache. Tried stretching increasing fluids for hydration, may take Tylenol, MRI was ordered.   MRI of the brain (personally reviewed images 01/22/2016) and agree with the following: Mild atrophy and chronic microvascular ischemic change. Tiny RIGHT thalamic focus chronic hemorrhage, likely sequelae of hypertensive cerebrovascular disease.   No acute intracranial findings are evident. Review of Systems: Patient complains of symptoms per HPI as well as the following symptoms: irritability/hallucinations . Pertinent negatives and positives per HPI. All others negative     Social History   Socioeconomic History   Marital status: Widowed    Spouse name: Not on file   Number of children: Not on file   Years of education: Not on file   Highest education level: 6th grade  Occupational History   Occupation: Retired  Tobacco Use   Smoking status: Never   Smokeless tobacco: Never  Vaping Use   Vaping status: Never Used  Substance and Sexual Activity   Alcohol use: No   Drug use: No   Sexual activity: Not on file  Other Topics Concern   Not on file  Social History Narrative   Lives  with her granddaughter   Caffeine use: coffee once in a blue moon   Social Drivers of Corporate investment banker Strain: Not on file  Food Insecurity: Patient Unable To Answer (07/24/2023)   Hunger Vital Sign     Worried About Running Out of Food in the Last Year: Patient unable to answer    Ran Out of Food in the Last Year: Patient unable to answer  Transportation Needs: Patient Unable To Answer (07/24/2023)   PRAPARE - Transportation    Lack of Transportation (Medical): Patient unable to answer    Lack of Transportation (Non-Medical): Patient unable to answer  Physical Activity: Not on file  Stress: Not on file  Social Connections: Socially Isolated (07/24/2023)   Social Connection and Isolation Panel [NHANES]    Frequency of Communication with Friends and Family: More than three times a week    Frequency of Social Gatherings with Friends and Family: Twice a week    Attends Religious Services: Never    Database administrator or Organizations: No    Attends Banker Meetings: Never    Marital Status: Widowed  Intimate Partner Violence: Patient Unable To Answer (07/24/2023)   Humiliation, Afraid, Rape, and Kick questionnaire    Fear of Current or Ex-Partner: Patient unable to answer    Emotionally Abused: Patient unable to answer    Physically Abused: Patient unable to answer    Sexually Abused: Patient unable to answer    Family History  Problem Relation Age of Onset   Cancer Mother        unknown type   Cancer Father    Heart attack Brother    Cancer Paternal Grandmother    Diabetes Daughter    Breast cancer Neg Hx    Alzheimer's disease Neg Hx     Past  Medical History:  Diagnosis Date   Anxiety state, unspecified    Arthropathy, unspecified, site unspecified    Benign carcinoid tumor of the duodenum    Blindness of one eye    Breast cancer (HCC) 2009   Cancer [facility])    breast   Colon cancer (HCC)    Depressive disorder, not elsewhere classified    Diarrhea    Disease of gallbladder    Esophageal cancer (HCC)    GERD (gastroesophageal reflux disease)    Headache(784.0)    Hyperlipidemia    Hypertension    Kidney stones    OA (osteoarthritis) of knee    right    Personal history of radiation therapy    Syncope and collapse    Unspecified ectopic pregnancy without intrauterine pregnancy     Past Surgical History:  Procedure Laterality Date   BREAST BIOPSY Bilateral    BREAST EXCISIONAL BIOPSY Right 2009   (+)   BREAST SURGERY Right    Cataract surgery Left 10/11/2017   Implant   CHOLECYSTECTOMY  05-27-12   COLON RESECTION     COLONOSCOPY     PARTIAL HYSTERECTOMY     TOTAL KNEE ARTHROPLASTY Right     Current Outpatient Medications  Medication Sig Dispense Refill   acetaminophen (TYLENOL) 650 MG CR tablet Take 650 mg by mouth as needed for pain.     alendronate (FOSAMAX) 70 MG tablet alendronate 70 mg tablet  TAKE 1 TABLET BY MOUTH ONCE A WEEK     amLODipine (NORVASC) 10 MG tablet Take 10 mg by mouth daily.     brimonidine (ALPHAGAN) 0.2 % ophthalmic solution Place 1 drop into both eyes 2 (two) times daily.     dorzolamide-timolol (COSOPT) 2-0.5 % ophthalmic solution Place 1 drop into both eyes 2 (two) times daily.     famotidine (PEPCID) 20 MG tablet Take 1 tablet (20 mg total) by mouth daily. 30 tablet 0   latanoprost (XALATAN) 0.005 % ophthalmic solution Place 1 drop into both eyes at bedtime.     sertraline (ZOLOFT) 100 MG tablet Take 200 mg by mouth daily.     aspirin 81 MG EC tablet Take 81 mg by mouth as needed.     memantine (NAMENDA) 10 MG tablet Take 1 tablet (10 mg total) by mouth 2 (two) times daily. 180 tablet 4   No current facility-administered medications for this visit.    Allergies as of 08/28/2023 - Review Complete 08/28/2023  Allergen Reaction Noted   Iodinated contrast media Hives 12/31/2008   Morphine Other (See Comments) 07/23/2023   Iohexol Hives 12/31/2008    Vitals: BP 107/64 (BP Location: Right Arm, Patient Position: Sitting, Cuff Size: Normal)   Pulse (!) 56   Ht 5\' 5"  (1.651 m) Comment: granddaughter report  Wt 140 lb (63.5 kg)   BMI 23.30 kg/m  Last Weight:  Wt Readings from Last 1 Encounters:   08/28/23 140 lb (63.5 kg)   Last Height:   Ht Readings from Last 1 Encounters:  08/28/23 5\' 5"  (1.651 m)  Exam: NAD, pleasant                  Speech:    Speech is normal; fluent and spontaneous with normal comprehension.  Cognition:  Moderate dementia    02/20/2022   10:03 AM 05/04/2021    8:37 AM 01/11/2021   11:50 AM  MMSE - Mini Mental State Exam  Orientation to time 4 4 4   Orientation to Place 5  4 4  Registration 3 3 3   Attention/ Calculation 0 2 0  Recall 1 0 2  Language- name 2 objects 1 2 2   Language- repeat 0 1 0  Language- follow 3 step command 2 2 3   Language- read & follow direction 1 1 1   Write a sentence 0 0 0  Copy design 0 0 0  Total score 17 19 19       Cranial Nerves:    The left pupil is round, and reactive to light, right pupil 1 mm larger and sluggish.Trigeminal sensation is intact and the muscles of mastication are normal.  Severe chronic vision loss OD.  The face is symmetric. The palate elevates in the midline. Hearing intact. Voice is normal. Shoulder shrug is normal. The tongue has normal motion without fasciculations.   Coordination:  No dysmetria  Motor Observation:    No asymmetry, no atrophy, and no involuntary movements noted. Tone:    Normal muscle tone.     Strength:    Strength is V/V in the upper and lower limbs.      Sensation: intact to LT bilaterally  Deep tendon reflexes: Symmetrical bilaterally  Coordination: No ataxia noted  Gait: Bradykinetic, stooped, with a walking aid, not classically parkinsonian or ataxic.     Assessment/Plan:  83 year old patient with dementia, FDG scan negative for alzheimers/fdg, has hallucinations and delusions maybe Lewy Body.  She cannot tolerate formal neurocog testing.  Last MMSE MMSE 17/30 in 2023 when we last saw her.  She had a syncopal event, more suggestive of syncope as opposed to seizure.  EEG was negative.  Exam in the hospital and here in the office was nonfocal.  MRI recently after a  syncopal event showed no acute event.  She did have an elevated creatinine likely acute kidney injury could have been due to dehydration.  Most likely etiology is syncope and loss of consciousness after hitting her head.  -08/28/2023: Syncopal episode likely syncope(possibly due to dehydration slight AKI inpatient) with head trauma less likely seizures, none since episode: always use walker, monitor for dehydration. She was only driking regular pepsis now on water and pedialte and ensure.  -Moderate Dementia: continue namenda however unclear if any significant benefot at this time, discussed quality of life which granddaughter is providing. Continue Zoloft. No behavioral problems. - home health and PT going to the house and she is improving - continue zoloft - Discussed caregiver burnout, granddaughter gets teary. - discussed pace of the triad or similar (talk to pcp), recommend applying, or adult day centers during the day and other options  Meds ordered this encounter  Medications   memantine (NAMENDA) 10 MG tablet    Sig: Take 1 tablet (10 mg total) by mouth 2 (two) times daily.    Dispense:  180 tablet    Refill:  4     PRIOR A/P  Addendum 06/19/2021: FDG Brain Scan did not show alzheimer's or frontotemporal. 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 We discussed with granddaughter and said she would let us know if she wanted to go forward. We discussed with her, she has not given [place] the go ahead so we are at a standstill on whether to go forward. Just Fyi to her care team in cardiology and I did forward this to her primary care as well.   Here with her granddaughter, she is on memantine a titration pack. Grandaughter  is in and out of the house. When granddaughter is there she gets patient's medication. Patient states she cannot tolerate memantine. She vomits with aricept and cannot take any medication orally  difficulty swallowing we will try and get patch approved. Memantine is also making her vomit and diarrhea,   - saw a geriatric doctor, granddaughter is also helping and sees her daily, still recommend a memory unit  - Could not tolerate aricept or namenda, vomiting, cannot swallow, will try a patch for aricept.   - [facility] brain study for granddaughter -[facility] Brain for granddaughter  - 01/2021: RPR, bmp, b12, mma, tsh, thiamine, homocysteine, were all unremarkable   - Tiny RIGHT thalamic focus chronic hemorrhage, likely sequelae of hypertensive cerebrovascular disease. Monitor blood pressure   - Snoring, memory loss, daytime somnolence, morning headache: sleep eval was negative   - In the past referred to Home health agency for nursing for medication management, PT for gait and safety for repeated falls and weakness. Here with Virgina Evener. [name]'s number is [phone] Continue. Will refer for gait abnormality, recent fall.  PT in [place]. We tried in the past will try again tpo have people go to the house.   - continue follow up with pcp, needs close follow up, life alert and nest cameras discussed  - CT of the head since a fall   - chronic headache, takes a tylenol, will take a CT of the head. She declines treatment. She just wants to to take tylenol, it helps, not severe. Continue primary care to check lab work. Always good.     No orders of the defined types were placed in this encounter.  Meds ordered this encounter  Medications   memantine (NAMENDA) 10 MG tablet    Sig: Take 1 tablet (10 mg total) by mouth 2 (two) times daily.    Dispense:  180 tablet    Refill:  4     Naomie Dean, MD  [facility] [address] [place], [place] [address]  Phone [phone] Fax [phone]  I spent over 40 minutes of face-to-face and non-face-to-face time with patient on the  1. Moderate dementia with agitation, unspecified dementia type  (HCC)   2. Fall, initial encounter   3. Syncope, unspecified syncope type      diagnosis.  This included previsit chart review, lab review, study review, order entry, electronic health record documentation, patient education on the different diagnostic and therapeutic options, counseling and coordination of care, risks and benefits of management, compliance, or risk factor reduction

## 2023-09-03 ENCOUNTER — Emergency Department
Admission: EM | Admit: 2023-09-03 | Discharge: 2023-09-03 | Disposition: A | Discharge status: Home / Self Care | Attending: Emergency Medicine | Admitting: Emergency Medicine

## 2023-09-03 ENCOUNTER — Other Ambulatory Visit: Payer: Self-pay

## 2023-09-03 ENCOUNTER — Encounter: Payer: Self-pay | Admitting: Emergency Medicine

## 2023-09-03 DIAGNOSIS — F0390 Unspecified dementia without behavioral disturbance: Secondary | ICD-10-CM | POA: Diagnosis not present

## 2023-09-03 DIAGNOSIS — R55 Syncope and collapse: Secondary | ICD-10-CM | POA: Diagnosis present

## 2023-09-03 DIAGNOSIS — I10 Essential (primary) hypertension: Secondary | ICD-10-CM | POA: Diagnosis not present

## 2023-09-03 LAB — URINALYSIS, ROUTINE W REFLEX MICROSCOPIC
Bilirubin Urine: NEGATIVE
Glucose, UA: NEGATIVE mg/dL
Hgb urine dipstick: NEGATIVE
Ketones, ur: NEGATIVE mg/dL
Leukocytes,Ua: NEGATIVE
Nitrite: NEGATIVE
Protein, ur: NEGATIVE mg/dL
Specific Gravity, Urine: 1.015 (ref 1.005–1.030)
pH: 7 (ref 5.0–8.0)

## 2023-09-03 LAB — CBC WITH DIFFERENTIAL/PLATELET
Abs Immature Granulocytes: 0.02 10*3/uL (ref 0.00–0.07)
Basophils Absolute: 0.1 10*3/uL (ref 0.0–0.1)
Basophils Relative: 1 %
Eosinophils Absolute: 0.2 10*3/uL (ref 0.0–0.5)
Eosinophils Relative: 4 %
HCT: 33 % — ABNORMAL LOW (ref 36.0–46.0)
Hemoglobin: 10.5 g/dL — ABNORMAL LOW (ref 12.0–15.0)
Immature Granulocytes: 0 %
Lymphocytes Relative: 24 %
Lymphs Abs: 1.2 10*3/uL (ref 0.7–4.0)
MCH: 31.3 pg (ref 26.0–34.0)
MCHC: 31.8 g/dL (ref 30.0–36.0)
MCV: 98.5 fL (ref 80.0–100.0)
Monocytes Absolute: 0.6 10*3/uL (ref 0.1–1.0)
Monocytes Relative: 12 %
Neutro Abs: 2.9 10*3/uL (ref 1.7–7.7)
Neutrophils Relative %: 59 %
Platelets: 160 10*3/uL (ref 150–400)
RBC: 3.35 MIL/uL — ABNORMAL LOW (ref 3.87–5.11)
RDW: 14.6 % (ref 11.5–15.5)
WBC: 4.9 10*3/uL (ref 4.0–10.5)
nRBC: 0 % (ref 0.0–0.2)

## 2023-09-03 LAB — COMPREHENSIVE METABOLIC PANEL WITH GFR
ALT: 16 U/L (ref 0–44)
AST: 25 U/L (ref 15–41)
Albumin: 3 g/dL — ABNORMAL LOW (ref 3.5–5.0)
Alkaline Phosphatase: 57 U/L (ref 38–126)
Anion gap: 8 (ref 5–15)
BUN: 38 mg/dL — ABNORMAL HIGH (ref 8–23)
CO2: 24 mmol/L (ref 22–32)
Calcium: 8.3 mg/dL — ABNORMAL LOW (ref 8.9–10.3)
Chloride: 109 mmol/L (ref 98–111)
Creatinine, Ser: 1.36 mg/dL — ABNORMAL HIGH (ref 0.44–1.00)
GFR, Estimated: 39 mL/min — ABNORMAL LOW (ref >60)
Glucose, Bld: 96 mg/dL (ref 70–99)
Potassium: 4 mmol/L (ref 3.5–5.1)
Sodium: 141 mmol/L (ref 135–145)
Total Bilirubin: 0.8 mg/dL (ref 0.0–1.2)
Total Protein: 5.5 g/dL — ABNORMAL LOW (ref 6.5–8.1)

## 2023-09-03 LAB — TROPONIN I (HIGH SENSITIVITY)
Troponin I (High Sensitivity): 8 ng/L (ref <18)
Troponin I (High Sensitivity): 6 ng/L (ref <18)

## 2023-09-03 MED ORDER — SODIUM CHLORIDE 0.9 % IV BOLUS
500.0000 mL | Freq: Once | INTRAVENOUS | Status: AC
  Administered 2023-09-03: 500 mL via INTRAVENOUS

## 2023-09-03 NOTE — ED Provider Notes (Signed)
 [facility] Provider Note    Event Date/Time   First MD Initiated Contact with Patient 09/03/23 1043     (approximate)   History   Hypotension   HPI  [name] is a 83 y.o. female with a history of recurrent syncope, ascending aortic aneurysm, hypertension, anemia, and dementia who presents with near syncope.  The granddaughter is the primary historian.  She states that the patient did not eat anything other than some Ensure this morning and took her medications including her blood pressure medicine.  She was at a primary care follow-up appointment when she started to appear lightheaded and looser color.  She nearly syncopized but did not lose consciousness.  They checked the blood pressure and it was in the 90s systolic.  They then checked orthostatics and the blood pressure went down to the 70s systolic.  The patient states that she is now feeling better.  She denies feeling dizzy right now.  She has no chest pain or difficulty breathing.  She denies any nausea or vomiting.  The patient was given about 500 cc of fluid by EMS.  The granddaughter reports that when physical therapy has been coming to the house over the last few weeks the blood pressure has been running low.  The patient is on antihypertensives, and after this episode today, her primary care doctor instructed them to discontinue antihypertensives going forward after the ED visit and workup.  I reviewed the past medical records.  The patient was most recently admitted to the hospitalist service last month after presenting with syncope.  Workup was overall unremarkable, with a negative EEG and cardiac telemetry showing a brief episode of atrial fibrillation.   Physical Exam   Triage Vital Signs: ED Triage Vitals [09/03/23 1044]  Encounter Vitals Group     BP      Systolic BP Percentile      Diastolic BP Percentile      Pulse      Resp      Temp      Temp src      SpO2      Weight 138 lb  14.2 oz (63 kg)     Height 5\' 5"  (1.651 m)     Head Circumference      Peak Flow      Pain Score 0     Pain Loc      Pain Education      Exclude from Growth Chart     Most recent vital signs: Vitals:   09/03/23 1330 09/03/23 1430  BP: 139/72 116/83  Pulse: (!) 47 (!) 49  Resp: 16 15  Temp:    SpO2: 100% 98%     General: Alert, well-appearing, no distress.  CV:  Good peripheral perfusion.  Resp:  Normal effort.  Lungs CTAB. Abd:  No distention.  Other:  EOMI.  PERRLA.  No facial droop.  Normal speech.  Motor intact in all extremities.   ED Results / Procedures / Treatments   Labs (all labs ordered are listed, but only abnormal results are displayed) Labs Reviewed  COMPREHENSIVE METABOLIC PANEL WITH GFR - Abnormal; Notable for the following components:      Result Value   BUN 38 (*)    Creatinine, Ser 1.36 (*)    Calcium 8.3 (*)    Total Protein 5.5 (*)    Albumin 3.0 (*)    GFR, Estimated 39 (*)    All other components within normal  limits  CBC WITH DIFFERENTIAL/PLATELET - Abnormal; Notable for the following components:   RBC 3.35 (*)    Hemoglobin 10.5 (*)    HCT 33.0 (*)    All other components within normal limits  URINALYSIS, ROUTINE W REFLEX MICROSCOPIC - Abnormal; Notable for the following components:   Color, Urine YELLOW (*)    APPearance CLEAR (*)    All other components within normal limits  TROPONIN I (HIGH SENSITIVITY)  TROPONIN I (HIGH SENSITIVITY)     EKG  ED ECG REPORT I, Dionne Bucy, the attending physician, personally viewed and interpreted this ECG.  Date: 09/03/2023 EKG Time: 1059 Rate: 53 Rhythm: normal sinus rhythm QRS Axis: normal Intervals: normal ST/T Wave abnormalities: Nonspecific ST abnormalities Narrative Interpretation: no evidence of acute ischemia; no significant change when compared to EKG of 07/22/2023    RADIOLOGY    PROCEDURES:  Critical Care performed: No  Procedures   MEDICATIONS ORDERED IN  ED: Medications  sodium chloride 0.9 % bolus 500 mL (500 mLs Intravenous New Bag/Given 09/03/23 1120)     IMPRESSION / MDM / ASSESSMENT AND PLAN / ED COURSE  I reviewed the triage vital signs and the nursing notes.  83 year old female with PMH as noted above presents with an episode of near syncope tension.  The blood pressure is now improving.  Per the granddaughter her blood pressures have been running low recently, and she is on antihypertensives.  On exam currently the blood pressure is 104/66.  Other vital signs are normal except for borderline bradycardia.  Neuroexam is nonfocal.  Physical exam is otherwise unremarkable.  Differential diagnosis includes, but is not limited to, hypotension related to her medications, dehydration/hypovolemia, electrolyte abnormality, other metabolic etiology, UTI or other infection, less likely cardiac etiology.  We will give a fluid bolus, obtain labs, and reassess.  Patient's presentation is most consistent with acute presentation with potential threat to life or bodily function.  The patient is on the cardiac monitor to evaluate for evidence of arrhythmia and/or significant heart rate changes.   ----------------------------------------- 3:48 PM on 09/03/2023 -----------------------------------------  Lab workup is unremarkable.  Troponins are negative x 2.  CBC shows no concerning anemia or leukocytosis.  Urinalysis is negative for findings of UTI.  CMP is also unremarkable.  The patient has remained asymptomatic with a stable blood pressure and heart rate throughout her stay in the ED.  The patient feels well and would like to go home, and the family is comfortable with her going home.  She is stable for discharge at this time.  They will discontinue the antihypertensives as recommended by the PMD.  I gave strict return precautions and the family members expressed understanding.  FINAL CLINICAL IMPRESSION(S) / ED DIAGNOSES   Final diagnoses:   Near syncope     Rx / DC Orders   ED Discharge Orders     None        Note:  This document was prepared using Dragon voice recognition software and may include unintentional dictation errors.    Dionne Bucy, MD 09/03/23 1549

## 2023-09-03 NOTE — ED Notes (Signed)
 Pt stood at bedside to transfer to [facility]. Pt was unable to urinate.  Water provided. Pt in bed and verbalizes no needs at this time.

## 2023-09-03 NOTE — Discharge Instructions (Signed)
 As discussed with your doctor, paused taking your blood pressure medications.  Keep track of your blood pressure over the next few weeks.  Follow-up with your primary care provider.  Return to the ER for new, worsening, or persistent severe low blood pressure, weakness, dizziness, recurrent episodes of nearly passing out or passing out, or any other new or worsening symptoms that are concerning.

## 2023-09-03 NOTE — ED Triage Notes (Signed)
 Patient to ED via [facility] from PCP after a follow up appointment for hypotension.  90/60 at PCP. Hx dementia  20 L forearm- given  102/60 54 HR 115 cbg 97.7 axillary 99%

## 2023-09-10 NOTE — Progress Notes (Unsigned)
 Cardiology Office Note    Date:  09/11/2023   ID:  [name], DOB [birth year], MRN [id number]  PCP:  Hillery Aldo, MD  Cardiologist:  Julien Nordmann, MD  Electrophysiologist:  None   Chief Complaint: Hospital follow-up  History of Present Illness:   [name] is a 83 y.o. female with history of recurrent syncope, vertigo, ascending aortic aneurysm measuring 4.2 cm, present colon cancer, HTN, and HLD who presents for hospital follow-up as outlined below.  She has a history of multiple syncopal episodes.  In 2011 and 2013 she had syncopal episodes while working with physical therapy with associated hypotension each time with BP ranging from 90-77 systolic.  She had a syncopal episode while at the ophthalmologist in the setting of acute abdominal pain.  She was found to be bradycardic in the 40s and hypotensive at that time.  She also had a near syncopal versus syncopal episode while riding in a car, as the passenger.  Lexiscan MPI in 2011 showed breast attenuation artifact in the anterior region with inability to definitively exclude mild ischemia.  Prior event monitor in 2012 showed sinus rhythm with PACs.  Echo from 2022 showed an EF of 50 to 55%, no regional wall motion abnormalities, moderate LVH, grade 1 diastolic dysfunction, normal RV systolic function and ventricular cavity size, mild mitral regurgitation, and mild to moderate aortic valve insufficiency.   She was admitted to the hospital in 07/2023 with syncope concerning for neurologic process such as a seizure disorder given prolonged LOC, bowel incontinence, and postictal state.  On telemetry she did have 1 brief episode of irregular heart rhythm, possibly concerning for A-fib.  No prolonged pauses or high-grade AV block on telemetry.  Echo showed an EF of 50 to 55%, grade 1 diastolic dysfunction, normal RV systolic function and ventricular cavity size, mildly dilated left atrium, trivial mitral regurgitation, and mild to  moderate aortic insufficiency.  MRI brain showed no acute intracranial abnormality.  EEG unrevealing.  She followed up with neurology in 08/2023 with a seizure disorder felt to be unlikely and the syncope felt to be exacerbated by dehydration and AKI with poor oral intake.  She was seen in the ED on 09/03/2023 with near syncope after having taken 5 mg of amlodipine that morning without eating or drinking anything other than an Ensure.  She initially presented to her PCPs office and was dizzy/lightheaded and found to be hypotensive with BP in the 90s systolic dropping to the 70s with orthostatics.  Blood pressure in the ED improved in the 1 teens to 130s systolic.  EKG shows sinus bradycardia with sinus arrhythmia, 53 bpm, rare PAC, and nonspecific ST-T changes.  High-sensitivity troponin negative.  Given noted improvement in symptoms and blood pressure she was discharged to outpatient follow-up.  Previously recommended ZIO was resulted in 09/2023 that showed a predominant rhythm of sinus with an average rate of 71 bpm (range 54 to 108 bpm in sinus), 210 episodes of SVT lasting up to 21.8 seconds with a maximum rate of 188 bpm, no sustained arrhythmias or prolonged pauses occurred.  Occasional PACs and rare PVCs.  Single patient event corresponded to sinus rhythm with isolated PAC.  She comes in today accompanied by her granddaughter and is without symptoms of angina or cardiac decompensation.  No palpitations, further presyncope or syncope.  She does note some positional dizziness.  Patient's granddaughter reports the patient was given 10 mg of amlodipine on the morning of 3/31 because the blood  pressure was in the 150s to 160s systolic.  Amlodipine has since been decreased to 2.5 mg, though the patient has not yet started this.  Blood pressure at home is frequently in the 150s to 160s.  Patient's granddaughter reports the patient has a good appetite.  No falls or symptoms concerning for bleeding.  No lower  extremity swelling or progressive orthopnea.   Labs independently reviewed: 08/2023 - Hgb 10.5, PLT 160, potassium 4.0, BUN 38, serum creatinine 1.36, albumin 3.0, AST/ALT normal 07/2023 - magnesium 2.4 02/2022 - TSH normal  Past Medical History:  Diagnosis Date   Anxiety state, unspecified    Arthropathy, unspecified, site unspecified    Benign carcinoid tumor of the duodenum    Blindness of one eye    Breast cancer (HCC) 2009   Cancer [facility])    breast   Colon cancer (HCC)    Depressive disorder, not elsewhere classified    Diarrhea    Disease of gallbladder    Esophageal cancer (HCC)    GERD (gastroesophageal reflux disease)    Headache(784.0)    Hyperlipidemia    Hypertension    Kidney stones    OA (osteoarthritis) of knee    right   Personal history of radiation therapy    Syncope and collapse    Unspecified ectopic pregnancy without intrauterine pregnancy     Past Surgical History:  Procedure Laterality Date   BREAST BIOPSY Bilateral    BREAST EXCISIONAL BIOPSY Right 2009   (+)   BREAST SURGERY Right    Cataract surgery Left 10/11/2017   Implant   CHOLECYSTECTOMY  05-27-12   COLON RESECTION     COLONOSCOPY     PARTIAL HYSTERECTOMY     TOTAL KNEE ARTHROPLASTY Right     Current Medications: Current Meds  Medication Sig   alendronate (FOSAMAX) 70 MG tablet alendronate 70 mg tablet  TAKE 1 TABLET BY MOUTH ONCE A WEEK   amLODipine (NORVASC) 10 MG tablet Take 10 mg by mouth daily.   aspirin 81 MG EC tablet Take 81 mg by mouth as needed.   brimonidine (ALPHAGAN) 0.2 % ophthalmic solution Place 1 drop into both eyes 2 (two) times daily.   dorzolamide-timolol (COSOPT) 2-0.5 % ophthalmic solution Place 1 drop into both eyes 2 (two) times daily.   famotidine (PEPCID) 20 MG tablet Take 1 tablet (20 mg total) by mouth daily.   latanoprost (XALATAN) 0.005 % ophthalmic solution Place 1 drop into both eyes at bedtime.   memantine (NAMENDA) 10 MG tablet Take 1 tablet (10  mg total) by mouth 2 (two) times daily.   sertraline (ZOLOFT) 100 MG tablet Take 200 mg by mouth daily.    Allergies:   Iodinated contrast media, Morphine, and Iohexol   Social History   Socioeconomic History   Marital status: Widowed    Spouse name: Not on file   Number of children: Not on file   Years of education: Not on file   Highest education level: 6th grade  Occupational History   Occupation: Retired  Tobacco Use   Smoking status: Never   Smokeless tobacco: Never  Vaping Use   Vaping status: Never Used  Substance and Sexual Activity   Alcohol use: No   Drug use: No   Sexual activity: Not on file  Other Topics Concern   Not on file  Social History Narrative   Lives  with her granddaughter   Caffeine use: coffee once in a blue moon   Social Drivers  of Health   Financial Resource Strain: Not on file  Food Insecurity: Patient Unable To Answer (07/24/2023)   Hunger Vital Sign    Worried About Running Out of Food in the Last Year: Patient unable to answer    Ran Out of Food in the Last Year: Patient unable to answer  Transportation Needs: Patient Unable To Answer (07/24/2023)   PRAPARE - Transportation    Lack of Transportation (Medical): Patient unable to answer    Lack of Transportation (Non-Medical): Patient unable to answer  Physical Activity: Not on file  Stress: Not on file  Social Connections: Socially Isolated (07/24/2023)   Social Connection and Isolation Panel [NHANES]    Frequency of Communication with Friends and Family: More than three times a week    Frequency of Social Gatherings with Friends and Family: Twice a week    Attends Religious Services: Never    Database administrator or Organizations: No    Attends Banker Meetings: Never    Marital Status: Widowed     Family History:  The patient's family history includes Cancer in her father, mother, and paternal grandmother; Diabetes in her daughter; Heart attack in her brother. There is  no history of Breast cancer or Alzheimer's disease.  ROS:   12-point review of systems is negative unless otherwise noted in the HPI.   EKGs/Labs/Other Studies Reviewed:    Studies reviewed were summarized above. The additional studies were reviewed today:  Zio patch 07/2023:     The patient was monitored for 14 days.   The predominant rhythm was sinus with an average rate of 71 bpm (range 54-108 bpm in sinus).   There were occasional PAC's and rare PVC's.   210 supraventricular runs were recorded, lasting up to 21.8 seconds with a maximum rate of 188 bpm.   No sustained arrhythmia or prolonged pause occurred.   Single patient-triggered event corresponds to normal sinus rhythm with isolated PAC.   Predominantly sinus rhythm with occasional PAC's and multiple episodes of PSVT, as detailed above. ___________  2D echo 07/24/2023: 1. Left ventricular ejection fraction, by estimation, is 50 to 55%. The  left ventricle has low normal function. Left ventricular endocardial  border not optimally defined to evaluate regional wall motion. Left  ventricular diastolic parameters are  consistent with Grade I diastolic dysfunction (impaired relaxation).   2. Right ventricular systolic function is normal. The right ventricular  size is normal. Tricuspid regurgitation signal is inadequate for assessing  PA pressure.   3. Left atrial size was mildly dilated.   4. The mitral valve is grossly normal. Trivial mitral valve  regurgitation. No evidence of mitral stenosis.   5. The aortic valve was not well visualized. Aortic valve regurgitation  is mild to moderate. No aortic stenosis is present.  __________  2D echo 12/30/2020: 1. Left ventricular ejection fraction, by estimation, is 50 to 55%. The  left ventricle has low normal function. The left ventricle has no regional  wall motion abnormalities. There is moderate left ventricular hypertrophy.  Left ventricular diastolic  parameters are  consistent with Grade I diastolic dysfunction (impaired  relaxation).   2. Right ventricular systolic function is normal. The right ventricular  size is normal.   3. The mitral valve is normal in structure. Mild mitral valve  regurgitation. No evidence of mitral stenosis.   4. The aortic valve was not well visualized. Aortic valve regurgitation  is mild to moderate.   EKG:  EKG is not  ordered today.    Recent Labs: 07/23/2023: Magnesium 2.4 09/03/2023: ALT 16; BUN 38; Creatinine, Ser 1.36; Hemoglobin 10.5; Platelets 160; Potassium 4.0; Sodium 141  Recent Lipid Panel No results found for: "CHOL", "TRIG", "HDL", "CHOLHDL", "VLDL", "LDLCALC", "LDLDIRECT"  PHYSICAL EXAM:    VS:  BP 120/82 (BP Location: Right Arm, Patient Position: Sitting, Cuff Size: Normal)   Pulse 71   Ht 5\' 5"  (1.651 m)   Wt 139 lb 9.6 oz (63.3 kg)   SpO2 99%   BMI 23.23 kg/m   BMI: Body mass index is 23.23 kg/m.  Physical Exam Constitutional:      Appearance: She is well-developed.  HENT:     Head: Normocephalic and atraumatic.  Eyes:     General:        Right eye: No discharge.        Left eye: No discharge.  Cardiovascular:     Rate and Rhythm: Regular rhythm. Bradycardia present. Occasional Extrasystoles are present.    Heart sounds: S1 normal and S2 normal. Heart sounds not distant. No midsystolic click and no opening snap. Murmur heard.     Diastolic murmur is present with a grade of 1/4 at the upper right sternal border.     No friction rub.  Pulmonary:     Effort: Pulmonary effort is normal. No respiratory distress.     Breath sounds: Normal breath sounds. No decreased breath sounds, wheezing, rhonchi or rales.  Chest:     Chest wall: No tenderness.  Musculoskeletal:     Cervical back: Normal range of motion.     Right lower leg: No edema.     Left lower leg: No edema.  Skin:    General: Skin is warm and dry.     Nails: There is no clubbing.  Neurological:     Mental Status: She is alert  and oriented to person, place, and time.  Psychiatric:        Speech: Speech normal.        Behavior: Behavior normal.        Thought Content: Thought content normal.        Judgment: Judgment normal.     Wt Readings from Last 3 Encounters:  09/11/23 139 lb 9.6 oz (63.3 kg)  09/03/23 138 lb 14.2 oz (63 kg)  08/28/23 140 lb (63.5 kg)     ASSESSMENT & PLAN:   Recurrent syncope: Longstanding history of syncopal episodes with uncertain etiology at this time.  Most recent episode not consistent with cardiac syncope and suggestive of seizure disorder given prolonged of consciousness and postictal state.  However, cannot exclude some degree of concussion contributing in the setting of syncopal episode.  It is certainly possible that her symptoms were related to orthostasis exacerbated by dehydration as noted with prior AKI at time of presentation.  Outpatient cardiac monitoring without significant sustained arrhythmia, high-grade AV block, or prolonged pauses.  Most recent episode of near syncope likely exacerbated by pharmacotherapy with amlodipine 10 mg being given in the context of possible labile blood pressures.  Recommend permissive hypertension with readings in the 150s to 160s systolic.  Agree with decreasing amlodipine to 2.5 mg daily, may need to discontinue altogether.  With underlying dementia, she may not be a good stress test or loop implantation candidate, granddaughter agrees at this time and prefers to avoid further testing pending patient's trend on the lower dose amlodipine with permissive hypertension.  Patient does not drive.  Ascending thoracic aortic aneurysm: Stable dating  back to 2018, measuring 4.2 cm.  Follow-up 07/2024.      Disposition: F/u with Dr. Mariah Milling or an APP in 1 month.   Medication Adjustments/Labs and Tests Ordered: Current medicines are reviewed at length with the patient today.  Concerns regarding medicines are outlined above. Medication changes, Labs and  Tests ordered today are summarized above and listed in the Patient Instructions accessible in Encounters.   Signed, Eula Listen, PA-C 09/11/2023 12:33 PM      [facility] [address] [place], [place] [address] [phone]

## 2023-09-11 ENCOUNTER — Encounter: Payer: Self-pay | Admitting: Physician Assistant

## 2023-09-11 ENCOUNTER — Ambulatory Visit: Payer: 59 | Attending: Physician Assistant | Admitting: Physician Assistant

## 2023-09-11 VITALS — BP 120/82 | HR 71 | Ht 65.0 in | Wt 139.6 lb

## 2023-09-11 DIAGNOSIS — I7121 Aneurysm of the ascending aorta, without rupture: Secondary | ICD-10-CM

## 2023-09-11 DIAGNOSIS — R55 Syncope and collapse: Secondary | ICD-10-CM | POA: Diagnosis not present

## 2023-09-11 NOTE — Patient Instructions (Signed)
 Medication Instructions:  Your Physician recommend you continue on your current medication as directed.    *If you need a refill on your cardiac medications before your next appointment, please call your pharmacy*  Lab Work: None ordered at this time   Follow-Up: At [facility], you and your health needs are our priority.  As part of our continuing mission to provide you with exceptional heart care, our providers are all part of one team.  This team includes your primary Cardiologist (physician) and Advanced Practice Providers or APPs (Physician Assistants and Nurse Practitioners) who all work together to provide you with the care you need, when you need it.  Your next appointment:   1 month(s)  Provider:   Eula Listen, PA-C

## 2023-09-12 ENCOUNTER — Inpatient Hospital Stay: Admitting: Adult Health

## 2023-10-19 ENCOUNTER — Ambulatory Visit: Attending: Physician Assistant | Admitting: Physician Assistant

## 2023-10-19 ENCOUNTER — Encounter: Payer: Self-pay | Admitting: Physician Assistant

## 2023-10-19 VITALS — BP 136/87 | HR 68 | Ht 65.0 in | Wt 136.8 lb

## 2023-10-19 DIAGNOSIS — I7121 Aneurysm of the ascending aorta, without rupture: Secondary | ICD-10-CM | POA: Diagnosis not present

## 2023-10-19 DIAGNOSIS — R55 Syncope and collapse: Secondary | ICD-10-CM | POA: Diagnosis not present

## 2023-10-19 MED ORDER — AMLODIPINE BESYLATE 2.5 MG PO TABS: 5.0000 mg | ORAL_TABLET | Freq: Every day | ORAL | 3 refills | Status: DC

## 2023-10-19 NOTE — Patient Instructions (Addendum)
    Medication Instructions:  Your Physician recommend you continue on your current medication as directed.    *If you need a refill on your cardiac medications before your next appointment, please call your pharmacy*  Lab Work: NONE  Testing/Procedures: NONE  Follow-Up: At [facility], you and your health needs are our priority.  As part of our continuing mission to provide you with exceptional heart care, our providers are all part of one team.  This team includes your primary Cardiologist (physician) and Advanced Practice Providers or APPs (Physician Assistants and Nurse Practitioners) who all work together to provide you with the care you need, when you need it.  Your next appointment:   3 month(s)  Provider:   You may see Timothy Gollan, MD or one of the following Advanced Practice Providers on your designated Care Team:   Laneta Pintos, NP Gildardo Labrador, PA-C Varney Gentleman, PA-C Cadence [place], PA-C Ronald Cockayne, NP Morey Ar, NP  We recommend signing up for the patient portal called "MyChart".  Sign up information is provided on this After Visit Summary.  MyChart is used to connect with patients for Virtual Visits (Telemedicine).  Patients are able to view lab/test results, encounter notes, upcoming appointments, etc.  Non-urgent messages can be sent to your provider as well.   To learn more about what you can do with MyChart, go to [email or web address].   Other Instructions NONE

## 2023-10-19 NOTE — Progress Notes (Signed)
 Cardiology Office Note    Date:  10/19/2023   ID:  [name], DOB 1941-05-07, MRN [id number]  PCP:  Comer Decamp, MD  Cardiologist:  Belva Boyden, MD  Electrophysiologist:  None   Chief Complaint: Follow-up  History of Present Illness:   [name] is a 83 y.o. female with history of recurrent syncope, vertigo, ascending aortic aneurysm measuring 4.2 cm, present colon cancer, HTN, and HLD who presents for follow-up of hypertension.   She has a history of multiple syncopal episodes.  In 2011 and 2013 she had syncopal episodes while working with physical therapy with associated hypotension each time with BP ranging from 90-77 systolic.  She had a syncopal episode while at the ophthalmologist in the setting of acute abdominal pain.  She was found to be bradycardic in the 40s and hypotensive at that time.  She also had a near syncopal versus syncopal episode while riding in a car, as the passenger.  Lexiscan MPI in 2011 showed breast attenuation artifact in the anterior region with inability to definitively exclude mild ischemia.  Prior event monitor in 2012 showed sinus rhythm with PACs.  Echo from 2022 showed an EF of 50 to 55%, no regional wall motion abnormalities, moderate LVH, grade 1 diastolic dysfunction, normal RV systolic function and ventricular cavity size, mild mitral regurgitation, and mild to moderate aortic valve insufficiency.    She was admitted to the hospital in 07/2023 with syncope concerning for neurologic process such as a seizure disorder given prolonged LOC, bowel incontinence, and postictal state.  On telemetry she did have 1 brief episode of irregular heart rhythm, possibly concerning for A-fib.  No prolonged pauses or high-grade AV block on telemetry.  Echo showed an EF of 50 to 55%, grade 1 diastolic dysfunction, normal RV systolic function and ventricular cavity size, mildly dilated left atrium, trivial mitral regurgitation, and mild to moderate aortic  insufficiency.  MRI brain showed no acute intracranial abnormality.  EEG unrevealing.   She followed up with neurology in 08/2023 with a seizure disorder felt to be unlikely and syncope felt to be exacerbated by dehydration and AKI with poor oral intake.   She was seen in the ED on 09/03/2023 with near syncope after having taken 5 mg of amlodipine  that morning without eating or drinking anything other than an Ensure.  She initially presented to her PCPs office and was dizzy/lightheaded and found to be hypotensive with BP in the 90s systolic dropping to the 70s with orthostatics.  Blood pressure in the ED improved in the 1 teens to 130s systolic.  EKG shows sinus bradycardia with sinus arrhythmia, 53 bpm, rare PAC, and nonspecific ST-T changes.  High-sensitivity troponin negative.  Given noted improvement in symptoms and blood pressure she was discharged to outpatient follow-up.   Previously recommended ZIO was resulted in 09/2023 that showed a predominant rhythm of sinus with an average rate of 71 bpm (range 54 to 108 bpm in sinus), 210 episodes of SVT lasting up to 21.8 seconds with a maximum rate of 188 bpm, no sustained arrhythmias or prolonged pauses occurred.  Occasional PACs and rare PVCs.  Single patient event corresponded to sinus rhythm with isolated PAC.  She was last seen in our office on 09/11/2023 and was without further presyncope or syncope.  She did note some positional dizziness.  Patient's granddaughter reported to the patient took 10 mg of amlodipine  on the morning of 3/31 followed by near syncope.  This has subsequently been decreased  to 2.5 mg.  Cardiac syncope was felt to be unlikely and it was recommended to allow for permissive hypertension.  She comes in accompanied by her granddaughter today and is doing well from a cardiac perspective, without symptoms of angina or cardiac decompensation.  No further presyncopal or syncopal episodes.  She did have 1 episode of positional dizziness  since she was last seen.  No palpitations, dyspnea, lower extremity swelling, or progressive orthopnea.  No falls or symptoms concerning for bleeding.  Blood pressure log at home shows readings before medications are largely in the 150s to 170s systolic.  Patient has been receiving 5 mg of amlodipine  since her last visit and done well with this.   Labs independently reviewed: 08/2023 - Hgb 10.5, PLT 160, potassium 4.0, BUN 38, serum creatinine 1.36, albumin 3.0, AST/ALT normal 07/2023 - magnesium  2.4 02/2022 - TSH normal  Past Medical History:  Diagnosis Date   Anxiety state, unspecified    Arthropathy, unspecified, site unspecified    Benign carcinoid tumor of the duodenum    Blindness of one eye    Breast cancer (HCC) 2009   Cancer [facility])    breast   Colon cancer (HCC)    Depressive disorder, not elsewhere classified    Diarrhea    Disease of gallbladder    Esophageal cancer (HCC)    GERD (gastroesophageal reflux disease)    Headache(784.0)    Hyperlipidemia    Hypertension    Kidney stones    OA (osteoarthritis) of knee    right   Personal history of radiation therapy    Syncope and collapse    Unspecified ectopic pregnancy without intrauterine pregnancy     Past Surgical History:  Procedure Laterality Date   BREAST BIOPSY Bilateral    BREAST EXCISIONAL BIOPSY Right 2009   (+)   BREAST SURGERY Right    Cataract surgery Left 10/11/2017   Implant   CHOLECYSTECTOMY  05-27-12   COLON RESECTION     COLONOSCOPY     PARTIAL HYSTERECTOMY     TOTAL KNEE ARTHROPLASTY Right     Current Medications: Current Meds  Medication Sig   acetaminophen  (TYLENOL ) 650 MG CR tablet Take 650 mg by mouth as needed for pain.   alendronate (FOSAMAX) 70 MG tablet alendronate 70 mg tablet  TAKE 1 TABLET BY MOUTH ONCE A WEEK   aspirin  81 MG EC tablet Take 81 mg by mouth as needed.   brimonidine  (ALPHAGAN ) 0.2 % ophthalmic solution Place 1 drop into both eyes 2 (two) times daily.    dorzolamide -timolol  (COSOPT ) 2-0.5 % ophthalmic solution Place 1 drop into both eyes 2 (two) times daily.   famotidine  (PEPCID ) 20 MG tablet Take 1 tablet (20 mg total) by mouth daily.   latanoprost  (XALATAN ) 0.005 % ophthalmic solution Place 1 drop into both eyes at bedtime.   memantine  (NAMENDA ) 10 MG tablet Take 1 tablet (10 mg total) by mouth 2 (two) times daily.   sertraline  (ZOLOFT ) 100 MG tablet Take 200 mg by mouth daily.   [DISCONTINUED] amLODipine  (NORVASC ) 2.5 MG tablet Take 5 mg by mouth daily. Two 2.5 tablets every morning.    Allergies:   Iodinated contrast media, Morphine , and Iohexol    Social History   Socioeconomic History   Marital status: Widowed    Spouse name: Not on file   Number of children: Not on file   Years of education: Not on file   Highest education level: 6th grade  Occupational History   Occupation:  Retired  Tobacco Use   Smoking status: Never   Smokeless tobacco: Never  Vaping Use   Vaping status: Never Used  Substance and Sexual Activity   Alcohol use: No   Drug use: No   Sexual activity: Not on file  Other Topics Concern   Not on file  Social History Narrative   Lives  with her granddaughter   Caffeine use: coffee once in a blue moon   Social Drivers of Corporate investment banker Strain: Not on file  Food Insecurity: Patient Unable To Answer (07/24/2023)   Hunger Vital Sign    Worried About Running Out of Food in the Last Year: Patient unable to answer    Ran Out of Food in the Last Year: Patient unable to answer  Transportation Needs: Patient Unable To Answer (07/24/2023)   PRAPARE - Transportation    Lack of Transportation (Medical): Patient unable to answer    Lack of Transportation (Non-Medical): Patient unable to answer  Physical Activity: Not on file  Stress: Not on file  Social Connections: Socially Isolated (07/24/2023)   Social Connection and Isolation Panel [NHANES]    Frequency of Communication with Friends and Family: More  than three times a week    Frequency of Social Gatherings with Friends and Family: Twice a week    Attends Religious Services: Never    Database administrator or Organizations: No    Attends Banker Meetings: Never    Marital Status: Widowed     Family History:  The patient's family history includes Cancer in her father, mother, and paternal grandmother; Diabetes in her daughter; Heart attack in her brother. There is no history of Breast cancer or Alzheimer's disease.  ROS:   12-point review of systems is negative unless otherwise noted in the HPI.   EKGs/Labs/Other Studies Reviewed:    Studies reviewed were summarized above. The additional studies were reviewed today:  Zio patch 07/2023:   The patient was monitored for 14 days.   The predominant rhythm was sinus with an average rate of 71 bpm (range 54-108 bpm in sinus).   There were occasional PAC's and rare PVC's.   210 supraventricular runs were recorded, lasting up to 21.8 seconds with a maximum rate of 188 bpm.   No sustained arrhythmia or prolonged pause occurred.   Single patient-triggered event corresponds to normal sinus rhythm with isolated PAC.   Predominantly sinus rhythm with occasional PAC's and multiple episodes of PSVT, as detailed above. ___________   2D echo 07/24/2023: 1. Left ventricular ejection fraction, by estimation, is 50 to 55%. The  left ventricle has low normal function. Left ventricular endocardial  border not optimally defined to evaluate regional wall motion. Left  ventricular diastolic parameters are  consistent with Grade I diastolic dysfunction (impaired relaxation).   2. Right ventricular systolic function is normal. The right ventricular  size is normal. Tricuspid regurgitation signal is inadequate for assessing  PA pressure.   3. Left atrial size was mildly dilated.   4. The mitral valve is grossly normal. Trivial mitral valve  regurgitation. No evidence of mitral stenosis.    5. The aortic valve was not well visualized. Aortic valve regurgitation  is mild to moderate. No aortic stenosis is present.  __________   2D echo 12/30/2020: 1. Left ventricular ejection fraction, by estimation, is 50 to 55%. The  left ventricle has low normal function. The left ventricle has no regional  wall motion abnormalities. There is moderate left  ventricular hypertrophy.  Left ventricular diastolic  parameters are consistent with Grade I diastolic dysfunction (impaired  relaxation).   2. Right ventricular systolic function is normal. The right ventricular  size is normal.   3. The mitral valve is normal in structure. Mild mitral valve  regurgitation. No evidence of mitral stenosis.   4. The aortic valve was not well visualized. Aortic valve regurgitation  is mild to moderate.    EKG:  EKG is not ordered today.    Recent Labs: 07/23/2023: Magnesium  2.4 09/03/2023: ALT 16; BUN 38; Creatinine, Ser 1.36; Hemoglobin 10.5; Platelets 160; Potassium 4.0; Sodium 141  Recent Lipid Panel No results found for: "CHOL", "TRIG", "HDL", "CHOLHDL", "VLDL", "LDLCALC", "LDLDIRECT"  PHYSICAL EXAM:    VS:  BP 136/87   Pulse 68   Ht 5\' 5"  (1.651 m)   Wt 136 lb 12.8 oz (62.1 kg)   SpO2 98%   BMI 22.76 kg/m   BMI: Body mass index is 22.76 kg/m.  Physical Exam Vitals reviewed.  Constitutional:      Appearance: She is well-developed.  HENT:     Head: Normocephalic and atraumatic.  Eyes:     General:        Right eye: No discharge.        Left eye: No discharge.  Cardiovascular:     Rate and Rhythm: Normal rate and regular rhythm.     Heart sounds: S1 normal and S2 normal. Heart sounds not distant. No midsystolic click and no opening snap. Murmur heard.     Diastolic murmur is present with a grade of 1/4 at the upper right sternal border.     No friction rub.  Pulmonary:     Effort: Pulmonary effort is normal. No respiratory distress.     Breath sounds: Normal breath sounds. No  decreased breath sounds, wheezing, rhonchi or rales.  Chest:     Chest wall: No tenderness.  Musculoskeletal:     Cervical back: Normal range of motion.     Right lower leg: No edema.     Left lower leg: No edema.  Skin:    General: Skin is warm and dry.     Nails: There is no clubbing.  Neurological:     Mental Status: She is alert and oriented to person, place, and time.  Psychiatric:        Speech: Speech normal.        Behavior: Behavior normal.        Thought Content: Thought content normal.        Judgment: Judgment normal.     Wt Readings from Last 3 Encounters:  10/19/23 136 lb 12.8 oz (62.1 kg)  09/11/23 139 lb 9.6 oz (63.3 kg)  09/03/23 138 lb 14.2 oz (63 kg)     ASSESSMENT & PLAN:   Recurrent syncope: Longstanding history of syncopal episodes.  Most recent episode is not consistent with cardiac syncope and more suggestive of seizure disorder given prolonged episode of loss of consciousness with postictal state.  However, cannot exclude some degree of concussion contributing to her presentation as well versus orthostasis exacerbated by dehydration as noted with prior AKI.  Outpatient cardiac monitoring without significant sustained arrhythmia, high-grade AV block, or prolonged pauses.  Echo without significant structural abnormality.  Recommend permissive hypertension with continuation of amlodipine  5 mg daily with hold parameters for systolic blood pressure less than 140 mmHg.  With underlying dementia she is not a good candidate for stress test or loop implantation,  granddaughter agrees and prefers to avoid further testing.  Ascending thoracic aortic aneurysm: Stable dating back to 2018, measuring 4.2 cm.  Follow-up in 07/2024.    Disposition: F/u with Dr. Gollan or an APP in 3 months.   Medication Adjustments/Labs and Tests Ordered: Current medicines are reviewed at length with the patient today.  Concerns regarding medicines are outlined above. Medication changes,  Labs and Tests ordered today are summarized above and listed in the Patient Instructions accessible in Encounters.   Signed, Varney Gentleman, PA-C 10/19/2023 10:14 AM     [facility] - [place] [address] [place], [place] [address] [phone]

## 2023-10-23 ENCOUNTER — Ambulatory Visit: Admitting: Podiatry

## 2024-01-04 ENCOUNTER — Telehealth: Admitting: Physician Assistant

## 2024-01-04 DIAGNOSIS — B029 Zoster without complications: Secondary | ICD-10-CM

## 2024-01-04 DIAGNOSIS — L259 Unspecified contact dermatitis, unspecified cause: Secondary | ICD-10-CM

## 2024-01-04 MED ORDER — VALACYCLOVIR HCL 1 G PO TABS: 1000.0000 mg | ORAL_TABLET | Freq: Three times a day (TID) | ORAL | 0 refills | Status: AC

## 2024-01-04 NOTE — Progress Notes (Signed)
 Virtual Visit Consent   [name], you are scheduled for a virtual visit with a [facility] provider today. Just as with appointments in the office, your consent must be obtained to participate. Your consent will be active for this visit and any virtual visit you may have with one of our providers in the next 365 days. If you have a MyChart account, a copy of this consent can be sent to you electronically.  As this is a virtual visit, video technology does not allow for your provider to perform a traditional examination. This may limit your provider's ability to fully assess your condition. If your provider identifies any concerns that need to be evaluated in person or the need to arrange testing (such as labs, EKG, etc.), we will make arrangements to do so. Although advances in technology are sophisticated, we cannot ensure that it will always work on either your end or our end. If the connection with a video visit is poor, the visit may have to be switched to a telephone visit. With either a video or telephone visit, we are not always able to ensure that we have a secure connection.  By engaging in this virtual visit, you consent to the provision of healthcare and authorize for your insurance to be billed (if applicable) for the services provided during this visit. Depending on your insurance coverage, you may receive a charge related to this service.  I need to obtain your verbal consent now. Are you willing to proceed with your visit today? [name] has provided verbal consent on 01/04/2024 for a virtual visit (video or telephone). Teena Shuck, [place]  Date: 01/04/2024 7:39 PM   Virtual Visit via Video Note   I, Teena Shuck, connected with  [name]  ([id number], Mar 13, 1941) on 01/04/24 at  7:30 PM EDT by a video-enabled telemedicine application and verified that I am speaking with the correct person using two identifiers.  Location: Patient: Virtual Visit Location  Patient: Home Provider: Virtual Visit Location Provider: Home Office   I discussed the limitations of evaluation and management by telemedicine and the availability of in person appointments. The patient expressed understanding and agreed to proceed.    History of Present Illness: [name] is a 83 y.o. who identifies as a female who was assigned female at birth, and is being seen today for rash..  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 2019 07/23/2023   Cognitive decline 02/20/2022   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, moderate, with agitation (HCC) 02/20/2022   Dementia with behavioral disturbance (HCC) 01/12/2021   Chronic diarrhea 12/28/2017   Intestinal adhesions with complete obstruction (HCC)    SBO (small bowel obstruction) (HCC) 12/03/2017   GERD (gastroesophageal reflux disease) 12/03/2017   TBI (traumatic brain injury) (HCC) 02/29/2016   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   Confusional arousals 02/29/2016   Memory loss 02/13/2016   Chronic headaches 02/13/2016   Snoring 02/13/2016   Daytime somnolence 02/13/2016   Bradycardia 03/11/2014   Preop cardiovascular exam 05/22/2012   Syncope and collapse 07/13/2010   Hyperlipidemia 03/29/2010   Chest pain 10/20/2009   Nonspecific (abnormal) findings on radiological and other examination of gastrointestinal tract 11/12/2007   BENIGN CARCINOID TUMOR OF THE DUODENUM 11/11/2007   Anxiety 11/11/2007   Anxiety and  depression 11/11/2007   Essential hypertension 11/11/2007   ECTOPIC PREGNANCY 11/11/2007   Arthropathy 11/11/2007   Headache 11/11/2007   DIARRHEA, CHRONIC 11/11/2007    Allergies:  Allergies  Allergen Reactions   Iodinated Contrast Media Hives   Morphine  Other (See Comments)    confusion  Other Reaction(s): confusion    Iohexol  Hives     Code: HIVES, Desc: PTS GRANDDAUGHTER [name] CALLED 12/31/08 TO NOTIFY US  THAT HER GRANDMOTHER HAD A REACTION TO THE IV DYE 8 HRS POST CT EXAM PT DEVELOPED HIVES/RASH.12/31/08/RM, Onset Date: [id number]    Medications:  Current Outpatient Medications:    acetaminophen  (TYLENOL ) 650 MG CR tablet, Take 650 mg by mouth as needed for pain., Disp: , Rfl:    alendronate (FOSAMAX) 70 MG tablet, alendronate 70 mg tablet  TAKE 1 TABLET BY MOUTH ONCE A WEEK, Disp: , Rfl:    amLODipine  (NORVASC ) 10 MG tablet, Take 10 mg by mouth daily. (Patient not taking: Reported on 10/19/2023), Disp: , Rfl:    amLODipine  (NORVASC ) 2.5 MG tablet, Take 2 tablets (5 mg total) by mouth daily., Disp: 180 tablet, Rfl: 3   aspirin  81 MG EC tablet, Take 81 mg by mouth as needed., Disp: , Rfl:    brimonidine  (ALPHAGAN ) 0.2 % ophthalmic solution, Place 1 drop into both eyes 2 (two) times daily., Disp: , Rfl:    dorzolamide -timolol  (COSOPT ) 2-0.5 % ophthalmic solution, Place 1 drop into both eyes 2 (two) times daily., Disp: , Rfl:    famotidine  (PEPCID ) 20 MG tablet, Take 1 tablet (20 mg total) by mouth daily., Disp: 30 tablet, Rfl: 0   latanoprost  (XALATAN ) 0.005 % ophthalmic solution, Place 1 drop into both eyes at bedtime., Disp: , Rfl:    memantine  (NAMENDA ) 10 MG tablet, Take 1 tablet (10 mg total) by mouth 2 (two) times daily., Disp: 180 tablet, Rfl: 4   sertraline  (ZOLOFT ) 100 MG tablet, Take 200 mg by mouth daily., Disp: , Rfl:   Observations/Objective: Patient is well-developed, well-nourished in no acute distress.  Resting comfortably  at home.  Head is normocephalic, atraumatic.  No labored breathing.  Speech is clear and coherent with logical content.  Patient is alert and oriented at baseline.    Assessment and Plan: 1. Contact dermatitis, unspecified contact dermatitis type, unspecified trigger (Primary)  Visit terminated due to technological difficulties. Patient/POA is to complete an evisit  with photos instead.   Follow Up Instructions: I discussed the assessment and treatment plan with the patient. The patient was provided an opportunity to ask questions and all were answered. The patient agreed with the plan and demonstrated an understanding of the instructions.  A copy of instructions were sent to the patient via MyChart unless otherwise noted below.    The patient was advised to call back or seek an in-person evaluation if the symptoms worsen or if the condition fails to improve as anticipated.    Teena Shuck, PA-C

## 2024-01-04 NOTE — Progress Notes (Signed)

## 2024-01-21 ENCOUNTER — Ambulatory Visit: Admitting: Cardiovascular Disease

## 2024-01-21 NOTE — Progress Notes (Unsigned)
 Cardiology Office Note    Date:  01/24/2024   ID:  [name], DOB 07-29-1940, MRN [id number]  PCP:  Tobie Domino, MD  Cardiologist:  Evalene Lunger, MD  Electrophysiologist:  None   Chief Complaint: Follow-up  History of Present Illness:   [name] is a 83 y.o. female with history of recurrent syncope, vertigo, ascending aortic aneurysm measuring 4.2 cm, PSVT, colon cancer, renal dysfunction, HTN, and HLD who presents for follow-up of recurrent syncope and HTN.  She has a history of multiple syncopal episodes.  In 2011 and 2013 she had syncopal episodes while working with physical therapy with associated hypotension each time with BP ranging from 90-77 mmHg systolic.  She had a syncopal episode while at the ophthalmologist in the setting of acute abdominal pain.  She was found to be bradycardic in the 40s and hypotensive at that time.  She also had a near syncopal versus syncopal episode while riding in a car, as the passenger.  Lexiscan MPI in 2011 showed breast attenuation artifact in the anterior region with inability to definitively exclude mild ischemia.  Prior event monitor in 2012 showed sinus rhythm with PACs.  Echo from 2022 showed an EF of 50 to 55%, no regional wall motion abnormalities, moderate LVH, grade 1 diastolic dysfunction, normal RV systolic function and ventricular cavity size, mild mitral regurgitation, and mild to moderate aortic valve insufficiency.    She was admitted to the hospital in 07/2023 with syncope concerning for neurologic process such as a seizure disorder given prolonged LOC, bowel incontinence, and postictal state.  On telemetry she did have 1 brief episode of irregular heart rhythm, possibly concerning for A-fib.  No prolonged pauses or high-grade AV block on telemetry.  Echo showed an EF of 50 to 55%, grade 1 diastolic dysfunction, normal RV systolic function and ventricular cavity size, mildly dilated left atrium, trivial mitral  regurgitation, and mild to moderate aortic insufficiency.  MRI brain showed no acute intracranial abnormality.  EEG unrevealing.   She followed up with neurology in 08/2023 with a seizure disorder felt to be unlikely and syncope felt to be exacerbated by dehydration and AKI with poor oral intake.   She was seen in the ED on 09/03/2023 with near syncope after having taken 5 mg of amlodipine  that morning without eating or drinking anything other than an Ensure.  She initially presented to her PCP's office and was dizzy/lightheaded and found to be hypotensive with BP in the 90s mmHg systolic dropping to the 70s with orthostatics.  Blood pressure in the ED improved in the 1 teens to 130s systolic.  EKG shows sinus bradycardia with sinus arrhythmia, 53 bpm, rare PAC, and nonspecific ST-T changes.  High-sensitivity troponin negative.     Previously recommended ZIO was resulted in 09/2023 that showed a predominant rhythm of sinus with an average rate of 71 bpm (range 54 to 108 bpm in sinus), 210 episodes of SVT lasting up to 21.8 seconds with a maximum rate of 188 bpm, no sustained arrhythmias or prolonged pauses occurred.  Occasional PACs and rare PVCs.  Single patient event corresponded to sinus rhythm with isolated PAC.   She was seen in our office on 09/11/2023 and was without further presyncope or syncope.  She did note some positional dizziness.  Patient's granddaughter reported to the patient took 10 mg of amlodipine  on the morning of 3/31 followed by near syncope.  Cardiac syncope was felt to be unlikely and it was recommended to  allow for permissive hypertension.  She was last seen in the office on 10/19/2023 and was without further presyncopal or syncopal episodes.  Recommendation for permissive hypertension was continued with continuation of amlodipine  5 mg daily with hold parameters for less than 140 mmHg systolic.  She comes in accompanied by her granddaughter today and is doing very well from a cardiac  perspective, without symptoms of angina or cardiac decompensation.  She has not had any further dizziness, presyncopal, or syncopal episodes.  She is now taking amlodipine  5 mg daily without off target effect.  Blood pressure at home has remained well-controlled.  No falls or symptoms concerning for bleeding.  No significant lower extremity swelling or progressive orthopnea.  Appetite remains good.  Patient and family do not have any acute cardiac concerns at this time.   Labs independently reviewed: 08/2023 - Hgb 10.5, PLT 160, potassium 4.0, BUN 38, serum creatinine 1.36, albumin 3.0, AST/ALT normal 07/2023 - magnesium  2.4 02/2022 - TSH normal  Past Medical History:  Diagnosis Date   Anxiety state, unspecified    Arthropathy, unspecified, site unspecified    Benign carcinoid tumor of the duodenum    Blindness of one eye    Breast cancer (HCC) 2009   Cancer [facility])    breast   Colon cancer (HCC)    Depressive disorder, not elsewhere classified    Diarrhea    Disease of gallbladder    Esophageal cancer (HCC)    GERD (gastroesophageal reflux disease)    Headache(784.0)    Hyperlipidemia    Hypertension    Kidney stones    OA (osteoarthritis) of knee    right   Personal history of radiation therapy    Syncope and collapse    Unspecified ectopic pregnancy without intrauterine pregnancy     Past Surgical History:  Procedure Laterality Date   BREAST BIOPSY Bilateral    BREAST EXCISIONAL BIOPSY Right 2009   (+)   BREAST SURGERY Right    Cataract surgery Left 10/11/2017   Implant   CHOLECYSTECTOMY  05-27-12   COLON RESECTION     COLONOSCOPY     PARTIAL HYSTERECTOMY     TOTAL KNEE ARTHROPLASTY Right     Current Medications: Current Meds  Medication Sig   acetaminophen  (TYLENOL ) 650 MG CR tablet Take 650 mg by mouth as needed for pain.   alendronate (FOSAMAX) 70 MG tablet alendronate 70 mg tablet  TAKE 1 TABLET BY MOUTH ONCE A WEEK   aspirin  81 MG EC tablet Take 81 mg by  mouth as needed.   brimonidine  (ALPHAGAN ) 0.2 % ophthalmic solution Place 1 drop into both eyes 2 (two) times daily.   dorzolamide -timolol  (COSOPT ) 2-0.5 % ophthalmic solution Place 1 drop into both eyes 2 (two) times daily.   famotidine  (PEPCID ) 20 MG tablet Take 1 tablet (20 mg total) by mouth daily.   latanoprost  (XALATAN ) 0.005 % ophthalmic solution Place 1 drop into both eyes at bedtime.   memantine  (NAMENDA ) 10 MG tablet Take 1 tablet (10 mg total) by mouth 2 (two) times daily.   sertraline  (ZOLOFT ) 100 MG tablet Take 200 mg by mouth daily.   [DISCONTINUED] amLODipine  (NORVASC ) 2.5 MG tablet Take 2 tablets (5 mg total) by mouth daily.    Allergies:   Iodinated contrast media, Morphine , and Iohexol    Social History   Socioeconomic History   Marital status: Widowed    Spouse name: Not on file   Number of children: Not on file   Years of  education: Not on file   Highest education level: 6th grade  Occupational History   Occupation: Retired  Tobacco Use   Smoking status: Never   Smokeless tobacco: Never  Vaping Use   Vaping status: Never Used  Substance and Sexual Activity   Alcohol use: No   Drug use: No   Sexual activity: Not on file  Other Topics Concern   Not on file  Social History Narrative   Lives  with her granddaughter   Caffeine use: coffee once in a blue moon   Social Drivers of Corporate investment banker Strain: Not on file  Food Insecurity: Patient Unable To Answer (07/24/2023)   Hunger Vital Sign    Worried About Running Out of Food in the Last Year: Patient unable to answer    Ran Out of Food in the Last Year: Patient unable to answer  Transportation Needs: Patient Unable To Answer (07/24/2023)   PRAPARE - Transportation    Lack of Transportation (Medical): Patient unable to answer    Lack of Transportation (Non-Medical): Patient unable to answer  Physical Activity: Not on file  Stress: Not on file  Social Connections: Socially Isolated (07/24/2023)    Social Connection and Isolation Panel    Frequency of Communication with Friends and Family: More than three times a week    Frequency of Social Gatherings with Friends and Family: Twice a week    Attends Religious Services: Never    Database administrator or Organizations: No    Attends Banker Meetings: Never    Marital Status: Widowed     Family History:  The patient's family history includes Cancer in her father, mother, and paternal grandmother; Diabetes in her daughter; Heart attack in her brother. There is no history of Breast cancer or Alzheimer's disease.  ROS:   12-point review of systems is negative unless otherwise noted in the HPI.   EKGs/Labs/Other Studies Reviewed:    Studies reviewed were summarized above. The additional studies were reviewed today:  Zio patch 07/2023:   The patient was monitored for 14 days.   The predominant rhythm was sinus with an average rate of 71 bpm (range 54-108 bpm in sinus).   There were occasional PAC's and rare PVC's.   210 supraventricular runs were recorded, lasting up to 21.8 seconds with a maximum rate of 188 bpm.   No sustained arrhythmia or prolonged pause occurred.   Single patient-triggered event corresponds to normal sinus rhythm with isolated PAC.   Predominantly sinus rhythm with occasional PAC's and multiple episodes of PSVT, as detailed above. ___________   2D echo 07/24/2023: 1. Left ventricular ejection fraction, by estimation, is 50 to 55%. The  left ventricle has low normal function. Left ventricular endocardial  border not optimally defined to evaluate regional wall motion. Left  ventricular diastolic parameters are  consistent with Grade I diastolic dysfunction (impaired relaxation).   2. Right ventricular systolic function is normal. The right ventricular  size is normal. Tricuspid regurgitation signal is inadequate for assessing  PA pressure.   3. Left atrial size was mildly dilated.   4. The mitral  valve is grossly normal. Trivial mitral valve  regurgitation. No evidence of mitral stenosis.   5. The aortic valve was not well visualized. Aortic valve regurgitation  is mild to moderate. No aortic stenosis is present.  __________   2D echo 12/30/2020: 1. Left ventricular ejection fraction, by estimation, is 50 to 55%. The  left ventricle has low  normal function. The left ventricle has no regional  wall motion abnormalities. There is moderate left ventricular hypertrophy.  Left ventricular diastolic  parameters are consistent with Grade I diastolic dysfunction (impaired  relaxation).   2. Right ventricular systolic function is normal. The right ventricular  size is normal.   3. The mitral valve is normal in structure. Mild mitral valve  regurgitation. No evidence of mitral stenosis.   4. The aortic valve was not well visualized. Aortic valve regurgitation  is mild to moderate.   EKG:  EKG is ordered today.  The EKG ordered today demonstrates NSR, 78 bpm, first-degree AV block, rare PAC poor R wave progression along the precordial leads, nonspecific ST-T changes  Recent Labs: 07/23/2023: Magnesium  2.4 09/03/2023: ALT 16; BUN 38; Creatinine, Ser 1.36; Hemoglobin 10.5; Platelets 160; Potassium 4.0; Sodium 141  Recent Lipid Panel No results found for: CHOL, TRIG, HDL, CHOLHDL, VLDL, LDLCALC, LDLDIRECT  PHYSICAL EXAM:    VS:  BP 118/72 (BP Location: Left Arm, Patient Position: Sitting, Cuff Size: Normal)   Pulse 78   Ht 5' 7 (1.702 m)   Wt 138 lb 6.4 oz (62.8 kg)   SpO2 97%   BMI 21.68 kg/m   BMI: Body mass index is 21.68 kg/m.  Physical Exam Vitals reviewed.  Constitutional:      Appearance: She is well-developed.  HENT:     Head: Normocephalic and atraumatic.  Eyes:     General:        Right eye: No discharge.        Left eye: No discharge.  Cardiovascular:     Rate and Rhythm: Normal rate and regular rhythm.     Heart sounds: S1 normal and S2 normal.  Heart sounds not distant. No midsystolic click and no opening snap. Murmur heard.     Systolic murmur is present with a grade of 1/6 at the upper right sternal border.     No friction rub.  Pulmonary:     Effort: Pulmonary effort is normal. No respiratory distress.     Breath sounds: Normal breath sounds. No decreased breath sounds, wheezing, rhonchi or rales.  Musculoskeletal:     Cervical back: Normal range of motion.     Right lower leg: No edema.     Left lower leg: No edema.  Skin:    General: Skin is warm and dry.     Nails: There is no clubbing.  Neurological:     Mental Status: She is alert and oriented to person, place, and time.  Psychiatric:        Speech: Speech normal.        Behavior: Behavior normal.        Thought Content: Thought content normal.        Judgment: Judgment normal.     Wt Readings from Last 3 Encounters:  01/24/24 138 lb 6.4 oz (62.8 kg)  10/19/23 136 lb 12.8 oz (62.1 kg)  09/11/23 139 lb 9.6 oz (63.3 kg)     ASSESSMENT & PLAN:   Recurrent syncope: Longstanding history of syncopal episodes suggestive of orthostasis exacerbated by dehydration with AKI with isolated episode suggestive of seizure disorder given prolonged episode of LOC with postictal state versus some degree of concussion.  Outpatient cardiac monitoring without significant sustained arrhythmia, high-grade AV block, or prolonged pauses.  Echo without significant structural abnormality.  Continue to recommend permissive hypertension with continuation of amlodipine  5 mg daily with hold parameters for systolic blood pressure less than 140  mmHg.  With underlying dementia, she is not a good candidate for stress test or loop implantation, family has been in agreement with this.  Ascending thoracic aortic aneurysm: Stable, dating back to 2018 measuring 4.2 cm.  Follow-up in 07/2024.   Disposition: F/u with Dr. Gollan or an APP in 6 months.   Medication Adjustments/Labs and Tests  Ordered: Current medicines are reviewed at length with the patient today.  Concerns regarding medicines are outlined above. Medication changes, Labs and Tests ordered today are summarized above and listed in the Patient Instructions accessible in Encounters.   Signed, Bernardino Bring, PA-C 01/24/2024 9:47 AM     [facility] [place] [address] [place], [place] [address] [phone]

## 2024-01-24 ENCOUNTER — Encounter: Payer: Self-pay | Admitting: Physician Assistant

## 2024-01-24 ENCOUNTER — Ambulatory Visit: Attending: Physician Assistant | Admitting: Physician Assistant

## 2024-01-24 VITALS — BP 118/72 | HR 78 | Ht 67.0 in | Wt 138.4 lb

## 2024-01-24 DIAGNOSIS — I7121 Aneurysm of the ascending aorta, without rupture: Secondary | ICD-10-CM | POA: Diagnosis not present

## 2024-01-24 DIAGNOSIS — R55 Syncope and collapse: Secondary | ICD-10-CM

## 2024-01-24 MED ORDER — AMLODIPINE BESYLATE 2.5 MG PO TABS: 5.0000 mg | ORAL_TABLET | Freq: Every day | ORAL | 3 refills | Status: AC

## 2024-01-24 NOTE — Patient Instructions (Signed)
 Medication Instructions:  Your physician recommends that you continue on your current medications as directed. Please refer to the Current Medication list given to you today.   *If you need a refill on your cardiac medications before your next appointment, please call your pharmacy*  Lab Work: None ordered at this time   Follow-Up: At [facility], you and your health needs are our priority.  As part of our continuing mission to provide you with exceptional heart care, our providers are all part of one team.  This team includes your primary Cardiologist (physician) and Advanced Practice Providers or APPs (Physician Assistants and Nurse Practitioners) who all work together to provide you with the care you need, when you need it.  Your next appointment:   6 month(s)  Provider:   You may see Timothy Gollan, MD or Varney Gentleman, PA-C

## 2024-03-11 ENCOUNTER — Ambulatory Visit: Admitting: Podiatry

## 2024-03-11 ENCOUNTER — Encounter: Payer: Self-pay | Admitting: Podiatry

## 2024-03-11 VITALS — Ht 67.0 in | Wt 138.4 lb

## 2024-03-11 DIAGNOSIS — M79675 Pain in left toe(s): Secondary | ICD-10-CM | POA: Diagnosis not present

## 2024-03-11 DIAGNOSIS — M79674 Pain in right toe(s): Secondary | ICD-10-CM

## 2024-03-11 DIAGNOSIS — B351 Tinea unguium: Secondary | ICD-10-CM

## 2024-03-11 NOTE — Progress Notes (Signed)
   Chief Complaint  Patient presents with   Nail Problem    Pt is here for RFC.    SUBJECTIVE Patient presents to office today complaining of elongated, thickened nails that cause pain while ambulating in shoes.  Patient is unable to trim their own nails. Patient is here for further evaluation and treatment.  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)    Blindness of one eye    Breast cancer (HCC) 2009   Cancer [facility])    breast   Colon cancer (HCC)    Depressive disorder, not elsewhere classified    Diarrhea    Disease of gallbladder    Esophageal cancer (HCC)    GERD (gastroesophageal reflux disease)    Headache(784.0)    Hyperlipidemia    Hypertension    Kidney stones    OA (osteoarthritis) of knee    right   Personal history of radiation therapy    Syncope and collapse    Unspecified ectopic pregnancy without intrauterine pregnancy     OBJECTIVE General Patient is awake, alert, and oriented x 3 and in no acute distress. Derm Skin is dry and supple bilateral. Negative open lesions or macerations. Remaining integument unremarkable. Nails are tender, long, thickened and dystrophic with subungual debris, consistent with onychomycosis, 1-5 bilateral. No signs of infection noted. Vasc  DP and PT pedal pulses palpable bilaterally. Temperature gradient within normal limits.  Neuro Epicritic and protective threshold sensation grossly intact bilaterally.  Musculoskeletal Exam No symptomatic pedal deformities noted bilateral. Muscular strength within normal limits.  ASSESSMENT 1.  Pain due to onychomycosis of toenails both  PLAN OF CARE 1. Patient evaluated today.  2. Instructed to maintain good pedal hygiene and foot care.  3. Mechanical debridement of nails 1-5 bilaterally performed using a nail nipper. Filed with dremel without incident.  4. Return to  clinic in 3 mos.    Thresa EMERSON Sar, DPM [facility]  Dr. Thresa EMERSON Sar, DPM    [address] [place], [place] [address]                Office [phone]  Fax [phone]

## 2024-03-13 ENCOUNTER — Telehealth: Payer: Self-pay

## 2024-03-13 NOTE — Telephone Encounter (Signed)
 Called and left voicemail for patient to reschedule appointment on 09/01/24 with Dr Ines.  If patient calls back, they can be rescheduled with Dr Chalice

## 2024-04-07 ENCOUNTER — Emergency Department

## 2024-04-07 ENCOUNTER — Emergency Department: Admission: EM | Admit: 2024-04-07 | Discharge: 2024-04-07 | Disposition: A | Discharge status: Home / Self Care

## 2024-04-07 ENCOUNTER — Other Ambulatory Visit: Payer: Self-pay

## 2024-04-07 DIAGNOSIS — F0390 Unspecified dementia without behavioral disturbance: Secondary | ICD-10-CM | POA: Insufficient documentation

## 2024-04-07 DIAGNOSIS — N289 Disorder of kidney and ureter, unspecified: Secondary | ICD-10-CM | POA: Insufficient documentation

## 2024-04-07 DIAGNOSIS — I10 Essential (primary) hypertension: Secondary | ICD-10-CM | POA: Insufficient documentation

## 2024-04-07 DIAGNOSIS — E860 Dehydration: Secondary | ICD-10-CM

## 2024-04-07 DIAGNOSIS — R197 Diarrhea, unspecified: Secondary | ICD-10-CM | POA: Insufficient documentation

## 2024-04-07 LAB — URINALYSIS, COMPLETE (UACMP) WITH MICROSCOPIC
Bacteria, UA: NONE SEEN
Bilirubin Urine: NEGATIVE
Glucose, UA: NEGATIVE mg/dL
Hgb urine dipstick: NEGATIVE
Ketones, ur: NEGATIVE mg/dL
Nitrite: NEGATIVE
Protein, ur: NEGATIVE mg/dL
Specific Gravity, Urine: 1.011 (ref 1.005–1.030)
pH: 8 (ref 5.0–8.0)

## 2024-04-07 LAB — CBC WITH DIFFERENTIAL/PLATELET
Abs Immature Granulocytes: 0.02 K/uL (ref 0.00–0.07)
Basophils Absolute: 0 K/uL (ref 0.0–0.1)
Basophils Relative: 1 %
Eosinophils Absolute: 0.1 K/uL (ref 0.0–0.5)
Eosinophils Relative: 2 %
HCT: 38.8 % (ref 36.0–46.0)
Hemoglobin: 12.5 g/dL (ref 12.0–15.0)
Immature Granulocytes: 0 %
Lymphocytes Relative: 25 %
Lymphs Abs: 1.2 K/uL (ref 0.7–4.0)
MCH: 31.1 pg (ref 26.0–34.0)
MCHC: 32.2 g/dL (ref 30.0–36.0)
MCV: 96.5 fL (ref 80.0–100.0)
Monocytes Absolute: 0.5 K/uL (ref 0.1–1.0)
Monocytes Relative: 10 %
Neutro Abs: 2.9 K/uL (ref 1.7–7.7)
Neutrophils Relative %: 62 %
Platelets: 165 K/uL (ref 150–400)
RBC: 4.02 MIL/uL (ref 3.87–5.11)
RDW: 14.3 % (ref 11.5–15.5)
WBC: 4.7 K/uL (ref 4.0–10.5)
nRBC: 0 % (ref 0.0–0.2)

## 2024-04-07 LAB — LACTIC ACID, PLASMA
Lactic Acid, Venous: 2 mmol/L (ref 0.5–1.9)
Lactic Acid, Venous: 1.3 mmol/L (ref 0.5–1.9)

## 2024-04-07 LAB — COMPREHENSIVE METABOLIC PANEL WITH GFR
ALT: 18 U/L (ref 0–44)
AST: 30 U/L (ref 15–41)
Albumin: 3.6 g/dL (ref 3.5–5.0)
Alkaline Phosphatase: 56 U/L (ref 38–126)
Anion gap: 12 (ref 5–15)
BUN: 24 mg/dL — ABNORMAL HIGH (ref 8–23)
CO2: 22 mmol/L (ref 22–32)
Calcium: 9.5 mg/dL (ref 8.9–10.3)
Chloride: 105 mmol/L (ref 98–111)
Creatinine, Ser: 1.43 mg/dL — ABNORMAL HIGH (ref 0.44–1.00)
GFR, Estimated: 37 mL/min — ABNORMAL LOW (ref >60)
Glucose, Bld: 108 mg/dL — ABNORMAL HIGH (ref 70–99)
Potassium: 4 mmol/L (ref 3.5–5.1)
Sodium: 139 mmol/L (ref 135–145)
Total Bilirubin: 0.6 mg/dL (ref 0.0–1.2)
Total Protein: 7.5 g/dL (ref 6.5–8.1)

## 2024-04-07 LAB — LIPASE, BLOOD: Lipase: 29 U/L (ref 11–51)

## 2024-04-07 LAB — RESP PANEL BY RT-PCR (RSV, FLU A&B, COVID)  RVPGX2
Influenza A by PCR: NEGATIVE
Influenza B by PCR: NEGATIVE
Resp Syncytial Virus by PCR: NEGATIVE
SARS Coronavirus 2 by RT PCR: NEGATIVE

## 2024-04-07 MED ORDER — SODIUM CHLORIDE 0.9 % IV BOLUS
1000.0000 mL | Freq: Once | INTRAVENOUS | Status: AC
  Administered 2024-04-07: 1000 mL via INTRAVENOUS

## 2024-04-07 MED ORDER — ONDANSETRON 4 MG PO TBDP: 4.0000 mg | ORAL_TABLET | Freq: Three times a day (TID) | ORAL | 0 refills | Status: AC | PRN

## 2024-04-07 NOTE — ED Provider Notes (Signed)
  Physical Exam  BP (!) 151/105   Pulse 66   Temp 98.3 F (36.8 C) (Oral)   Resp 15   SpO2 97%   Physical Exam  Procedures  Procedures  ED Course / MDM   Clinical Course as of 04/07/24 0955  Mon Apr 07, 2024  0631 WBC: 4.7 No leukocytosis [HD]  0641 CT ABDOMEN PELVIS WO CONTRAST Large hiatal hernia but no evidence of small bowel obstruction [HD]  0709 S/o from Dr. Nicholaus: 51 F hx dementia, ambulatory w/ walker, mult ab surgeries, chronic diarrhea - worsening diarrhea x3 days - lightheadedness yesterday - ab somewhat tender, contrast allergy (did noncon), hx SBO - labs w/ e/o dehdydration, getting ivf  TO DO: - f/u UA - if unremarkable, anticipate DC [MM]  0743 Viral swab neg [MM]  0827 Patient reevaluated, well-appearing, notes she is feeling much better with IV fluid.  Family notes that she has chronically decreased p.o. intake because she does not like to drink much fluids at baseline.  She had felt somewhat lightheaded and felt that she was dehydrated which is primarily why they brought her to the hospital today.  Discussed findings today which are overall reassuring and suggestive of likely dehydration.  CT abdomen pelvis with no underlying acute pathology.  Repeat abdominal exam on my read eval with no tenderness to deep palpation throughout, no distention, do not clinically suspect occult underlying abnormality at this time.  Patient says she can now urinate--will collect urine sample and repeat lactate.  Anticipate likely discharge home. [MM]  0931 UA w/ no e/o infxn. [MM]  0948 Rpt lactate normalized [MM]    Clinical Course User Index [HD] Nicholaus Rolland BRAVO, MD [MM] Clarine Ozell LABOR, MD   Medical Decision Making Amount and/or Complexity of Data Reviewed Labs: ordered. Decision-making details documented in ED Course. Radiology: ordered. Decision-making details documented in ED Course.  Risk Prescription drug management.          Clarine Ozell LABOR, MD 04/07/24  [phone]

## 2024-04-07 NOTE — Discharge Instructions (Signed)
 Your evaluation in the emergency department was overall reassuring.  We did see evidence of dehydration, you were treated with IV fluid.  I prescribed with a nausea medication to help with your p.o. intake.  Please do follow-up with your primary care provider for reevaluation, and return to the emergency department with any new or worsening symptoms.

## 2024-04-07 NOTE — ED Notes (Signed)
 Pt ambulated with assistance and walker to toilet and back to stretcher.

## 2024-04-07 NOTE — ED Notes (Signed)
 Fall risk band put on pt, nonskid socks on pt, and bed alarm turned on.

## 2024-04-07 NOTE — ED Triage Notes (Signed)
 Per daughter pt has had diarrhea since this past Saturday, pt had an episode yesterday where she became very weak and had low BP. Pt reports feeling fatigued.

## 2024-04-07 NOTE — ED Provider Notes (Signed)
 [facility] Provider Note    Event Date/Time   First MD Initiated Contact with Patient 04/07/24 [phone]     (approximate)   History   Abdominal Pain   HPI  [name] is a 83 y.o. female with a history of recurrent syncope, ascending aortic aneurysm, hypertension, anemia, dementia, and multiple abdominal surgeries in the setting of prior malignancy (not on any current treatment) who presents with 3 days of worsening diarrhea, lightheadedness and an episode of hypotension.  Patient has diarrhea at baseline per granddaughter who contributes most of the history.  However  for the past 3 days it has been more frequent than usual.  She has endorsed some lightheadedness with ambulation (patient normally walks with a walker and an assist).  There have been no falls.  However yesterday family noticed that her blood pressure was in the systolic of 90s after she endorsed some lightheadedness.  This resolved without any intervention.  There has been no increased confusion and patient has not complained of any chest pain or abdominal pain      Physical Exam   Triage Vital Signs: ED Triage Vitals [04/07/24 0522]  Encounter Vitals Group     BP (!) 165/102     Girls Systolic BP Percentile      Girls Diastolic BP Percentile      Boys Systolic BP Percentile      Boys Diastolic BP Percentile      Pulse Rate 79     Resp 19     Temp 98.3 F (36.8 C)     Temp Source Oral     SpO2 100 %     Weight      Height      Head Circumference      Peak Flow      Pain Score 0     Pain Loc      Pain Education      Exclude from Growth Chart     Most recent vital signs: Vitals:   04/07/24 0629 04/07/24 0630  BP:  (!) 153/103  Pulse:  69  Resp:    Temp:    SpO2: 98% 97%    Nursing Triage Note reviewed. Vital signs reviewed and patients oxygen saturation is normoxic  General: Patient is well nourished, well developed, awake and alert, resting comfortably in no acute  distress Head: Normocephalic and atraumatic Eyes: Normal inspection, extraocular muscles intact, no conjunctival pallor Ear, nose, throat: Normal external exam Neck: Normal range of motion Respiratory: Patient is in no respiratory distress, lungs CTAB Cardiovascular: Patient is not tachycardic, RR GI: Abd Soft, generalized tenderness to palpation with no guarding or rebound  Extremities: pulses intact with good cap refills, no LE pitting edema or calf tenderness Neuro: The patient is alert and oriented to person, place, not time, easily confused, with 5/5 bilat UE/LE strength, no gross motor or sensory defects noted. Coordination appears to be adequate. Skin: Warm, dry, and intact Psych: normal mood and affect, no SI or HI  ED Results / Procedures / Treatments   Labs (all labs ordered are listed, but only abnormal results are displayed) Labs Reviewed  COMPREHENSIVE METABOLIC PANEL WITH GFR - Abnormal; Notable for the following components:      Result Value   Glucose, Bld 108 (*)    BUN 24 (*)    Creatinine, Ser 1.43 (*)    GFR, Estimated 37 (*)    All other components within normal limits  LACTIC  ACID, PLASMA - Abnormal; Notable for the following components:   Lactic Acid, Venous 2.0 (*)    All other components within normal limits  RESP PANEL BY RT-PCR (RSV, FLU A&B, COVID)  [id number]  CBC WITH DIFFERENTIAL/PLATELET  LIPASE, BLOOD  LACTIC ACID, PLASMA  URINALYSIS, COMPLETE (UACMP) WITH MICROSCOPIC     EKG EKG and rhythm strip are interpreted by myself:   EKG: [Normal sinus rhythm] at heart rate of 72, normal QRS duration, QTc 450, nonspecific ST segments and T waves, PVCs EKG not consistent with Acute STEMI Rhythm strip: NSR in lead II   RADIOLOGY CT abd and pelvis with iv contrast: No acute abnormality on my independent review interpretation radiologist reads this as 1. No acute findings in the abdomen or pelvis.  2. Large hiatal hernia containing greater than 50%  intrathoracic stomach and  splenic flexure portion of the colon.     PROCEDURES:  Critical Care performed: No  Procedures   MEDICATIONS ORDERED IN ED: Medications  sodium chloride  0.9 % bolus 1,000 mL (1,000 mLs Intravenous New Bag/Given 04/07/24 0602)     IMPRESSION / MDM / ASSESSMENT AND PLAN / ED COURSE                                Differential diagnosis includes, but is not limited to, acute renal insufficiency, electrolyte derangement, small bowel obstruction, colitis, anemia, UTI   ED course: Patient presents and abdominal exam demonstrates no evidence of peritonitis.  Even though she denied any abdominal pain she is tender when I push on her abdomen.  Given her age and risk factors I did order a CT abdomen and pelvis to evaluate for the possibility of small bowel obstruction.  Patient unfortunately has a profound allergy to contrast and this was done without it however demonstrated no small bowel obstruction.  She does not have a leukocytosis.  Her lactic acid is borderline elevated and so was her creatinine and she is receiving 1 L of fluids.  I do think her clinical picture will is consistent with mild dehydration.  Patient will be signed out to oncoming physician at 7 AM pending repeat lactate, and urinalysis.   Clinical Course as of 04/07/24 0656  Mon Apr 07, 2024  0631 WBC: 4.7 No leukocytosis [HD]  0641 CT ABDOMEN PELVIS WO CONTRAST Large hiatal hernia but no evidence of small bowel obstruction [HD]    Clinical Course User Index [HD] Nicholaus Rolland BRAVO, MD   -- Risk: 5 This patient has a high risk of morbidity due to further diagnostic testing or treatment. Rationale: This patient's evaluation and management involve a high risk of morbidity due to the potential severity of presenting symptoms, need for diagnostic testing, and/or initiation of treatment that may require close monitoring. The differential includes conditions with potential for significant  deterioration or requiring escalation of care. Treatment decisions in the ED, including medication administration, procedural interventions, or disposition planning, reflect this level of risk. COPA: 5 The patient has the following acute or chronic illness/injury that poses a possible threat to life or bodily function: [X] : The patient has a potentially serious acute condition or an acute exacerbation of a chronic illness requiring urgent evaluation and management in the Emergency Department. The clinical presentation necessitates immediate consideration of life-threatening or function-threatening diagnoses, even if they are ultimately ruled out.   FINAL CLINICAL IMPRESSION(S) / ED DIAGNOSES   Final diagnoses:  Acute renal insufficiency  Diarrhea, unspecified type     Rx / DC Orders   ED Discharge Orders     None        Note:  This document was prepared using Dragon voice recognition software and may include unintentional dictation errors.   Nicholaus Rolland BRAVO, MD 04/07/24 [phone]

## 2024-07-23 ENCOUNTER — Ambulatory Visit: Admitting: Physician Assistant

## 2024-07-29 ENCOUNTER — Ambulatory Visit: Admitting: Physician Assistant

## 2024-09-01 ENCOUNTER — Ambulatory Visit: Admitting: Neurology
